# Patient Record
Sex: Female | Born: 1950 | Race: White | Hispanic: No | Marital: Married | Smoking: Former smoker
Health system: Southern US, Community
[De-identification: ages and names within clinical notes are randomized; demographics above are authoritative.]

## PROBLEM LIST (undated history)

## (undated) DIAGNOSIS — Z8489 Family history of other specified conditions: Secondary | ICD-10-CM

## (undated) DIAGNOSIS — N281 Cyst of kidney, acquired: Secondary | ICD-10-CM

## (undated) DIAGNOSIS — F419 Anxiety disorder, unspecified: Secondary | ICD-10-CM

## (undated) DIAGNOSIS — R112 Nausea with vomiting, unspecified: Secondary | ICD-10-CM

## (undated) DIAGNOSIS — I10 Essential (primary) hypertension: Secondary | ICD-10-CM

## (undated) DIAGNOSIS — G4700 Insomnia, unspecified: Secondary | ICD-10-CM

## (undated) DIAGNOSIS — Z9889 Other specified postprocedural states: Secondary | ICD-10-CM

## (undated) DIAGNOSIS — R519 Headache, unspecified: Secondary | ICD-10-CM

## (undated) DIAGNOSIS — M722 Plantar fascial fibromatosis: Secondary | ICD-10-CM

## (undated) DIAGNOSIS — M797 Fibromyalgia: Secondary | ICD-10-CM

## (undated) DIAGNOSIS — I671 Cerebral aneurysm, nonruptured: Secondary | ICD-10-CM

## (undated) DIAGNOSIS — G5600 Carpal tunnel syndrome, unspecified upper limb: Secondary | ICD-10-CM

## (undated) DIAGNOSIS — K219 Gastro-esophageal reflux disease without esophagitis: Secondary | ICD-10-CM

## (undated) DIAGNOSIS — E785 Hyperlipidemia, unspecified: Secondary | ICD-10-CM

## (undated) DIAGNOSIS — E039 Hypothyroidism, unspecified: Secondary | ICD-10-CM

## (undated) DIAGNOSIS — J309 Allergic rhinitis, unspecified: Secondary | ICD-10-CM

## (undated) DIAGNOSIS — K5792 Diverticulitis of intestine, part unspecified, without perforation or abscess without bleeding: Secondary | ICD-10-CM

## (undated) DIAGNOSIS — R51 Headache: Secondary | ICD-10-CM

## (undated) HISTORY — DX: Carpal tunnel syndrome, unspecified upper limb: G56.00

## (undated) HISTORY — DX: Cyst of kidney, acquired: N28.1

## (undated) HISTORY — PX: TONSILLECTOMY: SUR1361

## (undated) HISTORY — DX: Essential (primary) hypertension: I10

## (undated) HISTORY — PX: CARPAL TUNNEL RELEASE: SHX101

## (undated) HISTORY — DX: Hyperlipidemia, unspecified: E78.5

## (undated) HISTORY — DX: Gastro-esophageal reflux disease without esophagitis: K21.9

## (undated) HISTORY — DX: Insomnia, unspecified: G47.00

## (undated) HISTORY — DX: Cerebral aneurysm, nonruptured: I67.1

## (undated) HISTORY — DX: Anxiety disorder, unspecified: F41.9

## (undated) HISTORY — DX: Allergic rhinitis, unspecified: J30.9

## (undated) HISTORY — DX: Hypothyroidism, unspecified: E03.9

## (undated) HISTORY — DX: Fibromyalgia: M79.7

---

## 1992-09-26 HISTORY — PX: PARTIAL HYSTERECTOMY: SHX80

## 1992-09-26 HISTORY — PX: BREAST LUMPECTOMY: SHX2

## 1999-09-27 HISTORY — PX: APPENDECTOMY: SHX54

## 2000-08-17 ENCOUNTER — Encounter: Payer: Self-pay | Admitting: Emergency Medicine

## 2000-08-17 ENCOUNTER — Inpatient Hospital Stay (HOSPITAL_COMMUNITY): Admission: EM | Admit: 2000-08-17 | Discharge: 2000-08-19 | Payer: Self-pay | Admitting: *Deleted

## 2000-08-17 ENCOUNTER — Encounter (INDEPENDENT_AMBULATORY_CARE_PROVIDER_SITE_OTHER): Payer: Self-pay | Admitting: Specialist

## 2002-10-02 ENCOUNTER — Encounter: Payer: Self-pay | Admitting: Family Medicine

## 2002-10-02 ENCOUNTER — Ambulatory Visit (HOSPITAL_COMMUNITY): Admission: RE | Admit: 2002-10-02 | Discharge: 2002-10-02 | Payer: Self-pay | Admitting: Family Medicine

## 2004-03-02 ENCOUNTER — Encounter: Admission: RE | Admit: 2004-03-02 | Discharge: 2004-03-02 | Payer: Self-pay | Admitting: Neurology

## 2004-05-05 ENCOUNTER — Ambulatory Visit (HOSPITAL_COMMUNITY): Admission: RE | Admit: 2004-05-05 | Discharge: 2004-05-05 | Payer: Self-pay | Admitting: Family Medicine

## 2008-11-17 ENCOUNTER — Emergency Department (HOSPITAL_COMMUNITY): Admission: EM | Admit: 2008-11-17 | Discharge: 2008-11-17 | Payer: Self-pay | Admitting: Internal Medicine

## 2008-11-29 ENCOUNTER — Encounter: Admission: RE | Admit: 2008-11-29 | Discharge: 2008-11-29 | Payer: Self-pay | Admitting: Family Medicine

## 2008-12-01 ENCOUNTER — Encounter: Admission: RE | Admit: 2008-12-01 | Discharge: 2008-12-01 | Payer: Self-pay | Admitting: Family Medicine

## 2010-01-26 ENCOUNTER — Encounter: Admission: RE | Admit: 2010-01-26 | Discharge: 2010-01-26 | Payer: Self-pay | Admitting: Family Medicine

## 2010-10-16 ENCOUNTER — Encounter: Payer: Self-pay | Admitting: Family Medicine

## 2011-01-11 LAB — POCT I-STAT, CHEM 8
Calcium, Ion: 1.22 mmol/L (ref 1.12–1.32)
Chloride: 106 mEq/L (ref 96–112)
Creatinine, Ser: 0.8 mg/dL (ref 0.4–1.2)
Hemoglobin: 13.3 g/dL (ref 12.0–15.0)
Sodium: 141 mEq/L (ref 135–145)

## 2011-01-11 LAB — LIPASE, BLOOD: Lipase: 28 U/L (ref 11–59)

## 2011-01-11 LAB — POCT CARDIAC MARKERS: Myoglobin, poc: 58.7 ng/mL (ref 12–200)

## 2011-01-11 LAB — URINALYSIS, ROUTINE W REFLEX MICROSCOPIC
Hgb urine dipstick: NEGATIVE
Ketones, ur: NEGATIVE mg/dL
pH: 5.5 (ref 5.0–8.0)

## 2011-01-11 LAB — HEPATIC FUNCTION PANEL
ALT: 22 U/L (ref 0–35)
AST: 19 U/L (ref 0–37)
Albumin: 3.3 g/dL — ABNORMAL LOW (ref 3.5–5.2)
Bilirubin, Direct: <0.1 mg/dL (ref 0.0–0.3)

## 2011-01-11 LAB — DIFFERENTIAL
Basophils Relative: 1 % (ref 0–1)
Eosinophils Relative: 2 % (ref 0–5)
Monocytes Absolute: 0.4 10*3/uL (ref 0.1–1.0)
Neutro Abs: 4.5 10*3/uL (ref 1.7–7.7)
Neutrophils Relative %: 69 % (ref 43–77)

## 2011-01-11 LAB — CBC
MCHC: 35 g/dL (ref 30.0–36.0)
MCV: 90.2 fL (ref 78.0–100.0)

## 2011-02-11 NOTE — Op Note (Signed)
[place]  Patient:    Amanda Fry, Amanda Fry                   MRN: [id number] Proc. Date: 08/17/00 Adm. Date:  [id number] Attending:  Abigail Miyamoto A                           Operative Report  PREOPERATIVE DIAGNOSIS:  Perforated appendicitis.  POSTOPERATIVE DIAGNOSIS:  Perforated appendicitis.  PROCEDURE:  Open appendectomy.  SURGEON:  Abigail Miyamoto, M.D.  ANESTHESIA:  General endotracheal anesthesia.  ESTIMATED BLOOD LOSS:  Minimal.  DESCRIPTION OF PROCEDURE:  The patient was brought to the operating room, identified as [name].  She was placed supine on the operating room table, and general anesthesia was induced.  Her abdomen was then prepped and draped in the usual sterile fashion.  Using a #10 blade, a small transverse incision was made in the patients right lower quadrant.  The incision was carried down through Scarpas fascia with the electrocautery.  The external oblique fascia was then identified and opened with the scalp. and further with Metzenbaums.  The underlying muscle fibers were then split bluntly with a Kelly clamp.  The underlying peritoneum was then identified and opened with the scalpel.  Upon entering the abdominal cavity, a moderate amount of turbid fluid and purulence was identified.  This fluid was sent for cultures and sensitivities.  The appendix was then identified and grasped with a Babcock clamp and elevated out of the wound.  It was felt to be inflamed and had perforation at the tip.  Mesoappendix was then taken down with several clamps and 2-0 Vicryl ties.  Appendiceal base was then identified and crushed with a hemostat and then tied off in the imbricated fashion with several 2-0 Vicryl ties.  The appendix was completely transected and removed from the field. Hemostasis appeared to be achieved.  The abdomen was then copiously irrigated with normal saline.  The peritoneum was then closed with a  running 2-0 Vicryl suture.  Fascial layer was then closed with a running 2-0 Vicryl suture as well.  The wound was then again thoroughly irrigated.  Scarpas fascia was closed with running 2-0 Vicryl suture.  The fascial layer was then closed in a running 2-0 Vicryl suture as well.  The wound was then again thoroughly irrigated.  Scarpas fascia was closed with interrupted 0 Vicryl sutures and the skin was closed with skin staples.  The patient tolerated the procedure well.  All sponge, needle, and instrument counts were correct at the end of the procedure.  The patient was then extubated in the operating room and taken in stable condition to the recovery room. DD:  08/17/00 TD:  08/18/00 Job: [id number]

## 2012-01-25 ENCOUNTER — Other Ambulatory Visit (HOSPITAL_COMMUNITY): Payer: Self-pay | Admitting: Family Medicine

## 2012-01-25 DIAGNOSIS — R109 Unspecified abdominal pain: Secondary | ICD-10-CM

## 2012-01-26 ENCOUNTER — Ambulatory Visit (HOSPITAL_COMMUNITY)
Admission: RE | Admit: 2012-01-26 | Discharge: 2012-01-26 | Disposition: A | Discharge status: Home / Self Care | Payer: BC Managed Care – PPO | Source: Ambulatory Visit | Attending: Family Medicine | Admitting: Family Medicine

## 2012-01-26 ENCOUNTER — Ambulatory Visit
Admission: RE | Admit: 2012-01-26 | Discharge: 2012-01-26 | Disposition: A | Discharge status: Home / Self Care | Payer: BC Managed Care – PPO | Source: Ambulatory Visit | Attending: Family Medicine | Admitting: Family Medicine

## 2012-01-26 ENCOUNTER — Other Ambulatory Visit: Payer: Self-pay | Admitting: Family Medicine

## 2012-01-26 DIAGNOSIS — R109 Unspecified abdominal pain: Secondary | ICD-10-CM

## 2012-01-26 DIAGNOSIS — N281 Cyst of kidney, acquired: Secondary | ICD-10-CM | POA: Insufficient documentation

## 2012-01-26 DIAGNOSIS — R1011 Right upper quadrant pain: Secondary | ICD-10-CM

## 2012-01-26 MED ORDER — IOHEXOL 300 MG/ML  SOLN
125.0000 mL | Freq: Once | INTRAMUSCULAR | Status: AC | PRN
  Administered 2012-01-26: 125 mL via INTRAVENOUS

## 2012-01-31 ENCOUNTER — Other Ambulatory Visit: Payer: Self-pay | Admitting: Family Medicine

## 2012-01-31 ENCOUNTER — Ambulatory Visit
Admission: RE | Admit: 2012-01-31 | Discharge: 2012-01-31 | Disposition: A | Discharge status: Home / Self Care | Payer: BC Managed Care – PPO | Source: Ambulatory Visit | Attending: Family Medicine | Admitting: Family Medicine

## 2012-01-31 DIAGNOSIS — R109 Unspecified abdominal pain: Secondary | ICD-10-CM

## 2012-02-06 ENCOUNTER — Other Ambulatory Visit (HOSPITAL_COMMUNITY): Payer: Self-pay | Admitting: Gastroenterology

## 2012-02-06 DIAGNOSIS — R1011 Right upper quadrant pain: Secondary | ICD-10-CM

## 2012-02-09 ENCOUNTER — Encounter (HOSPITAL_COMMUNITY)
Admission: RE | Admit: 2012-02-09 | Discharge: 2012-02-09 | Disposition: A | Discharge status: Home / Self Care | Payer: BC Managed Care – PPO | Source: Ambulatory Visit | Attending: Gastroenterology | Admitting: Gastroenterology

## 2012-02-09 DIAGNOSIS — R1011 Right upper quadrant pain: Secondary | ICD-10-CM | POA: Insufficient documentation

## 2012-02-09 MED ORDER — TECHNETIUM TC 99M MEBROFENIN IV KIT
5.0000 | PACK | Freq: Once | INTRAVENOUS | Status: AC | PRN
  Administered 2012-02-09: 5 via INTRAVENOUS

## 2012-02-09 MED ORDER — SINCALIDE 5 MCG IJ SOLR
INTRAMUSCULAR | Status: AC
  Administered 2012-02-09: 1.82 ug via INTRAVENOUS
  Filled 2012-02-09: qty 5

## 2012-02-09 MED ORDER — SINCALIDE 5 MCG IJ SOLR
0.0200 ug/kg | Freq: Once | INTRAMUSCULAR | Status: AC
  Administered 2012-02-09: 1.82 ug via INTRAVENOUS

## 2012-05-27 ENCOUNTER — Ambulatory Visit (INDEPENDENT_AMBULATORY_CARE_PROVIDER_SITE_OTHER): Payer: BC Managed Care – PPO | Admitting: Emergency Medicine

## 2012-05-27 DIAGNOSIS — S61219A Laceration without foreign body of unspecified finger without damage to nail, initial encounter: Secondary | ICD-10-CM

## 2012-05-27 DIAGNOSIS — M79646 Pain in unspecified finger(s): Secondary | ICD-10-CM

## 2012-05-27 DIAGNOSIS — S61209A Unspecified open wound of unspecified finger without damage to nail, initial encounter: Secondary | ICD-10-CM

## 2012-05-27 DIAGNOSIS — M79609 Pain in unspecified limb: Secondary | ICD-10-CM

## 2012-05-27 NOTE — Patient Instructions (Addendum)
WOUND CARE Please return in 7-10 days to have your stitches/staples removed or sooner if you have concerns. Marland Kitchen Keep area clean and dry for 24 hours. Do not remove bandage, if applied. . After 24 hours, remove bandage and wash wound gently with mild soap and warm water. Reapply a new bandage after cleaning wound, if directed. . Continue daily cleansing with soap and water until stitches/staples are removed. . Do not apply any ointments or creams to the wound while stitches/staples are in place, as this may cause delayed healing. . Notify the office if you experience any of the following signs of infection: Swelling, redness, pus drainage, streaking, fever >101.0 F . Notify the office if you experience excessive bleeding that does not stop after 15-20 minutes of constant, firm pressure.    Laceration Care, Adult A laceration is a cut or lesion that goes through all layers of the skin and into the tissue just beneath the skin. TREATMENT  Some lacerations may not require closure. Some lacerations may not be able to be closed due to an increased risk of infection. It is important to see your caregiver as soon as possible after an injury to minimize the risk of infection and maximize the opportunity for successful closure. If closure is appropriate, pain medicines may be given, if needed. The wound will be cleaned to help prevent infection. Your caregiver will use stitches (sutures), staples, wound glue (adhesive), or skin adhesive strips to repair the laceration. These tools bring the skin edges together to allow for faster healing and a better cosmetic outcome. However, all wounds will heal with a scar. Once the wound has healed, scarring can be minimized by covering the wound with sunscreen during the day for 1 full year. HOME CARE INSTRUCTIONS  For sutures or staples:  Keep the wound clean and dry.   If you were given a bandage (dressing), you should change it at least once a day. Also,  change the dressing if it becomes wet or dirty, or as directed by your caregiver.   Wash the wound with soap and water 2 times a day. Rinse the wound off with water to remove all soap. Pat the wound dry with a clean towel.   After cleaning, apply a thin layer of the antibiotic ointment as recommended by your caregiver. This will help prevent infection and keep the dressing from sticking.   You may shower as usual after the first 24 hours. Do not soak the wound in water until the sutures are removed.   Only take over-the-counter or prescription medicines for pain, discomfort, or fever as directed by your caregiver.   Get your sutures or staples removed as directed by your caregiver.  For skin adhesive strips:  Keep the wound clean and dry.   Do not get the skin adhesive strips wet. You may bathe carefully, using caution to keep the wound dry.   If the wound gets wet, pat it dry with a clean towel.   Skin adhesive strips will fall off on their own. You may trim the strips as the wound heals. Do not remove skin adhesive strips that are still stuck to the wound. They will fall off in time.  For wound adhesive:  You may briefly wet your wound in the shower or bath. Do not soak or scrub the wound. Do not swim. Avoid periods of heavy perspiration until the skin adhesive has fallen off on its own. After showering or bathing, gently pat the wound dry with  a clean towel.   Do not apply liquid medicine, cream medicine, or ointment medicine to your wound while the skin adhesive is in place. This may loosen the film before your wound is healed.   If a dressing is placed over the wound, be careful not to apply tape directly over the skin adhesive. This may cause the adhesive to be pulled off before the wound is healed.   Avoid prolonged exposure to sunlight or tanning lamps while the skin adhesive is in place. Exposure to ultraviolet light in the first year will darken the scar.   The skin adhesive  will usually remain in place for 5 to 10 days, then naturally fall off the skin. Do not pick at the adhesive film.  You may need a tetanus shot if:  You cannot remember when you had your last tetanus shot.   You have never had a tetanus shot.  If you get a tetanus shot, your arm may swell, get red, and feel warm to the touch. This is common and not a problem. If you need a tetanus shot and you choose not to have one, there is a rare chance of getting tetanus. Sickness from tetanus can be serious. SEEK MEDICAL CARE IF:   You have redness, swelling, or increasing pain in the wound.   You see a red line that goes away from the wound.   You have yellowish-white fluid (pus) coming from the wound.   You have a fever.   You notice a bad smell coming from the wound or dressing.   Your wound breaks open before or after sutures have been removed.   You notice something coming out of the wound such as wood or glass.   Your wound is on your hand or foot and you cannot move a finger or toe.  SEEK IMMEDIATE MEDICAL CARE IF:   Your pain is not controlled with prescribed medicine.   You have severe swelling around the wound causing pain and numbness or a change in color in your arm, hand, leg, or foot.   Your wound splits open and starts bleeding.   You have worsening numbness, weakness, or loss of function of any joint around or beyond the wound.   You develop painful lumps near the wound or on the skin anywhere on your body.  MAKE SURE YOU:   Understand these instructions.   Will watch your condition.   Will get help right away if you are not doing well or get worse.  Document Released: 09/12/2005 Document Revised: 09/01/2011 Document Reviewed: 03/08/2011 [facility] Patient Information 2012 [place], [place].

## 2012-05-27 NOTE — Progress Notes (Signed)
Patient ID: MICHON KACZMAREK MRN: [id number], DOB: [birth year], 61 y.o. Date of Encounter: 05/27/2012, 6:14 PM   PROCEDURE NOTE: Verbal consent obtained. Sterile technique employed. Numbing: Anesthesia obtained with 2% lidocaine plain.  Cleansed with soap and water. Irrigated.  Wound explored, no deep structures involved, no foreign bodies.   Wound repaired with # 4 SI sutures. Hemostasis obtained. Wound cleansed and dressed.  Wound care instructions including precautions covered with patient. Handout given.  Anticipate suture removal in 7-10 days  Rhoderick Moody, PA-C 05/27/2012 6:14 PM

## 2012-05-27 NOTE — Progress Notes (Signed)
   Date:  05/27/2012   Name:  RAYNEE MCCASLAND   DOB:  01-24-1951   MRN:  [id number] Gender: female Age: 61 y.o.  PCP:  Allean Found, MD    Chief Complaint: No chief complaint on file.   History of Present Illness:  Amanda Fry is a 61 y.o. pleasant patient who presents with the following:  Cutting up a cantaloup and slipped and cut her left index finger at the tip. Not current on tetanus and she will check with her FMD Tuesday to verify that and obtain one if needed there.  Denies any other complaint.  There is no problem list on file for this patient.   No past medical history on file.  No past surgical history on file.  History  Substance Use Topics  . Smoking status: Not on file  . Smokeless tobacco: Not on file  . Alcohol Use: Not on file    No family history on file.  No Known Allergies  Medication list has been reviewed and updated.  No current outpatient prescriptions on file prior to visit.    Review of Systems:  As per HPI, otherwise negative.    Physical Examination: There were no vitals filed for this visit. There were no vitals filed for this visit. There is no height or weight on file to calculate BMI. Ideal Body Weight:     GEN: WDWN, NAD, Non-toxic, Alert & Oriented x 3 HEENT: Atraumatic, Normocephalic.  Ears and Nose: No external deformity. EXTR: No clubbing/cyanosis/edema NEURO: Normal gait.  PSYCH: Normally interactive. Conversant. Not depressed or anxious appearing.  Calm demeanor.  Left Hand:  Small flap laceration tip of fourth finger.  Flap has good circulation.  NATI  No FB  Assessment and Plan: Laceration finger tip  Follow up in one week for suture removal  Carmelina Dane, MD

## 2012-06-05 ENCOUNTER — Ambulatory Visit (INDEPENDENT_AMBULATORY_CARE_PROVIDER_SITE_OTHER): Payer: BC Managed Care – PPO | Admitting: Family Medicine

## 2012-06-05 VITALS — BP 130/76 | HR 68 | Temp 98.4°F | Resp 18

## 2012-06-05 DIAGNOSIS — S61209A Unspecified open wound of unspecified finger without damage to nail, initial encounter: Secondary | ICD-10-CM

## 2012-06-05 NOTE — Progress Notes (Signed)
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 She's had no significant pain, redness, discharge, or loss of range of motion  Objective: 5 sutures identified at tip of left index finger with well healed distal phalanx. Sutures removed without problem  Assessment: Well-healed wound following accident of

## 2012-06-07 ENCOUNTER — Encounter: Payer: Self-pay | Admitting: Family Medicine

## 2013-03-20 ENCOUNTER — Other Ambulatory Visit: Payer: Self-pay | Admitting: Family Medicine

## 2013-03-20 DIAGNOSIS — N281 Cyst of kidney, acquired: Secondary | ICD-10-CM

## 2013-03-22 ENCOUNTER — Ambulatory Visit
Admission: RE | Admit: 2013-03-22 | Discharge: 2013-03-22 | Disposition: A | Discharge status: Home / Self Care | Source: Ambulatory Visit | Attending: Family Medicine | Admitting: Family Medicine

## 2013-03-22 DIAGNOSIS — N281 Cyst of kidney, acquired: Secondary | ICD-10-CM

## 2014-03-20 ENCOUNTER — Other Ambulatory Visit: Payer: Self-pay | Admitting: Orthopedic Surgery

## 2014-03-20 DIAGNOSIS — M542 Cervicalgia: Secondary | ICD-10-CM

## 2014-03-30 ENCOUNTER — Ambulatory Visit
Admission: RE | Admit: 2014-03-30 | Discharge: 2014-03-30 | Disposition: A | Discharge status: Home / Self Care | Source: Ambulatory Visit | Attending: Orthopedic Surgery | Admitting: Orthopedic Surgery

## 2014-03-30 DIAGNOSIS — M542 Cervicalgia: Secondary | ICD-10-CM

## 2015-04-07 ENCOUNTER — Encounter: Payer: Self-pay | Admitting: Neurology

## 2015-04-10 ENCOUNTER — Encounter: Payer: Self-pay | Admitting: Neurology

## 2015-04-10 ENCOUNTER — Ambulatory Visit (INDEPENDENT_AMBULATORY_CARE_PROVIDER_SITE_OTHER): Admitting: Neurology

## 2015-04-10 VITALS — BP 118/82 | HR 80 | Ht 67.0 in | Wt 207.0 lb

## 2015-04-10 DIAGNOSIS — I671 Cerebral aneurysm, nonruptured: Secondary | ICD-10-CM

## 2015-04-10 DIAGNOSIS — G250 Essential tremor: Secondary | ICD-10-CM

## 2015-04-10 MED ORDER — DIAZEPAM 5 MG PO TABS: ORAL_TABLET | ORAL | Status: DC

## 2015-04-10 NOTE — Progress Notes (Signed)
Subjective:   Amanda Fry was seen in consultation in the movement disorder clinic at the request of Reginia Naas, MD.  The evaluation is for tremor.  The patient is a 65 y.o. right handed female with a history of tremor.  Tremor is in the right hand and has been in that hand for several years but recently she has noted it a few times in the L hand and that has worried her.  She notes it first when she writes.  Sometimes she has to hold a cup with 2 hands.  Other people will always ask her why she is shaking.  She never notices the tremor at rest.  There is no family hx of tremor.    Affected by caffeine:  No. (2 small cups coffee per day) Affected by alcohol:  No.  Affected by stress/anxiety:  Yes.   Affected by fatigue:  No. Spills soup if on spoon:  Yes.   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  ? 17mm ACA aneurysm.  Also ? Minimal bulge, cannot exclude aneurysm, of cavernous segment of left ICA.  Outside reports reviewed: lab reports, office notes, radiology reports and referral letter/letters.  Allergies  Allergen Reactions  . Biaxin [Clarithromycin]   . Shellfish Allergy     Outpatient Encounter Prescriptions as of 04/10/2015  Medication Sig  . aspirin 81 MG tablet Take 81 mg by mouth daily.  . Calcium Carbonate-Vitamin D (CALCIUM 500 + D PO) Take by mouth.  . cetirizine (ZYRTEC) 10 MG tablet Take 10 mg by mouth daily.  . Cholecalciferol (VITAMIN D) 2000 UNITS tablet Take 2,000 Units by mouth daily.  . diclofenac (VOLTAREN) 75 MG EC tablet Take 75 mg by mouth 2  (two) times daily.  Marland Kitchen esomeprazole (NEXIUM) 20 MG capsule Take 20 mg by mouth daily at 12 noon.  [name] Oil 300 MG CAPS Take by mouth.  . levothyroxine (SYNTHROID, LEVOTHROID) 137 MCG tablet Take 137 mcg by mouth daily before breakfast.  . Magnesium 500 MG CAPS Take by mouth.  . metoprolol succinate (TOPROL-XL) 50 MG 24 hr tablet Take 50 mg by mouth daily. Take with or immediately following a meal.  . rosuvastatin (CRESTOR) 10 MG tablet Take 10 mg by mouth daily.  Marland Kitchen zolpidem (AMBIEN CR) 6.25 MG CR tablet Take 6.25 mg by mouth at bedtime as needed for sleep.   No facility-administered encounter medications on file as of 04/10/2015.    Past Medical History  Diagnosis Date  . HLD (hyperlipidemia)   . GERD (gastroesophageal reflux disease)   . Anxiety   . Hypothyroidism   . Hypertension     Past Surgical History  Procedure Laterality Date  . Partial hysterectomy  1994  . Appendectomy  2001  . Breast lumpectomy Left 1994    History   Social History  . Marital Status: Married    Spouse Name: N/A  . Number of Children: N/A  . Years of Education: N/A   Occupational History  . Not on file.   Social History Main Topics  . Smoking status:  Former Smoker    Quit date: 09/26/2009  . Smokeless tobacco: Not on file  . Alcohol Use: 0.0 oz/week    0 Standard drinks or equivalent per week     Comment: one drink a month  . Drug Use: No  . Sexual Activity: Not on file   Other Topics Concern  . Not on file   Social History Narrative    Family Status  Relation Status Death Age  . Father Deceased 24    heart disease  . Mother Deceased     breast cancer  . Brother Alive     healthy  . Brother Alive     healthy  . Sister Alive     DM, breast cancer  . Son Alive     healthy  . Daughter Alive     healthy    Review of Systems A complete 10 system ROS was obtained and was negative apart from what is mentioned.   Objective:   VITALS:   Filed Vitals:   04/10/15 0955    BP: 118/82  Pulse: 80  Height: 5\' 7"  (1.702 m)  Weight: 207 lb (93.895 kg)   Gen:  Appears stated age and in NAD. HEENT:  Normocephalic, atraumatic. The mucous membranes are moist. The superficial temporal arteries are without ropiness or tenderness. Cardiovascular: Regular rate and rhythm. Lungs: Clear to auscultation bilaterally. Neck: There are no carotid bruits noted bilaterally.  NEUROLOGICAL:  Orientation:  The patient is alert and oriented x 3.  Recent and remote memory are intact.  Attention span and concentration are normal.  Able to name objects and repeat without trouble.  Fund of knowledge is appropriate Cranial nerves: There is good facial symmetry. The pupils are equal round and reactive to light bilaterally. Fundoscopic exam reveals clear disc margins bilaterally. Extraocular muscles are intact and visual fields are full to confrontational testing. Speech is fluent and clear. Soft palate rises symmetrically and there is no tongue deviation. Hearing is intact to conversational tone. Tone: Tone is good throughout. Sensation: Sensation is intact to light touch and pinprick throughout (facial, trunk, extremities). Vibration is intact at the bilateral big toe. There is no extinction with double simultaneous stimulation. There is no sensory dermatomal level identified. Coordination:  The patient has no dysdiadichokinesia or dysmetria. Motor: Strength is 5/5 in the bilateral upper and lower extremities.  There is some decreased grip strength on the right compared to that of the left, but there is also some give way weakness when testing the intrinsic musculature of the hand on the right that improves with encouragement.  Shoulder shrug is equal bilaterally.  There is no pronator drift.  There are no fasciculations noted. DTR's: Deep tendon reflexes are 2/4 at the bilateral biceps, triceps, brachioradialis, 1/4 at the bilateral patella and absent at the bilateral achilles.  Plantar  responses are downgoing bilaterally. Gait and Station: The patient is able to ambulate without difficulty. The patient is able to heel toe walk without any difficulty. The patient is not able to ambulate in a tandem fashion without difficulty. The patient is able to stand in the Romberg position.   MOVEMENT EXAM: Tremor:  There is no significant tremor in the UE, noted most significantly with action.  Tremor is evident when she draws Archimedes spirals bilaterally.  There is no tremor at rest.  When asked to pour water from one glass to another, she spills the water when the full glass of water is in the right hand.  Assessment/Plan:   1.  Essential Tremor.  -Long talk with the patient regarding pathophysiology of essential tremor.  I see no evidence of a neurodegenerative process such as Parkinson's disease.  Talked with her about various treatments.  She really wants no medical treatment for now.  Talked to her about various medical devices, such as weighted utensils, which can help.  Happy to write a prescription if she would like, but she wants to hold on that for right now.  I told her that a fatter, heavier pen would help her write better than a thinner, light weight one.  Patient education was provided.  Literature was given to the patient.  We talked about liftware.   2.  Abnormal brain scan the past  -There is a question of cerebral aneurysm on her 2010 MRA of the brain.  I really do think that this needs reimaged and she was agreeable.  She is claustrophobic and Valium will be provided for this. 3.  I will plan on seeing the patient back in 6-8 months, sooner should new neurologic issues arise.  Greater than 50% of the 60 minute visit spent in counseling as above.

## 2015-04-10 NOTE — Patient Instructions (Addendum)
1. We have scheduled you at [facility] for your MRA on 04/20/2015 at 5:00 pm. Please arrive 15 minutes prior and go to 1st floor radiology. If you need to reschedule for any reason please call [phone].

## 2015-04-20 ENCOUNTER — Ambulatory Visit (HOSPITAL_COMMUNITY)
Admission: RE | Admit: 2015-04-20 | Discharge: 2015-04-20 | Disposition: A | Discharge status: Home / Self Care | Source: Ambulatory Visit | Attending: Neurology | Admitting: Neurology

## 2015-04-20 ENCOUNTER — Other Ambulatory Visit: Payer: Self-pay | Admitting: Neurology

## 2015-04-20 DIAGNOSIS — I671 Cerebral aneurysm, nonruptured: Secondary | ICD-10-CM

## 2015-04-20 DIAGNOSIS — R251 Tremor, unspecified: Secondary | ICD-10-CM | POA: Diagnosis present

## 2015-04-20 LAB — POCT I-STAT CREATININE: Creatinine, Ser: 0.9 mg/dL (ref 0.44–1.00)

## 2015-04-21 ENCOUNTER — Telehealth: Payer: Self-pay | Admitting: Neurology

## 2015-04-21 NOTE — Telephone Encounter (Signed)
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  It is unchanged in size from 2010.  Happy to send her for an opinion with interventionalist if she would like (they probably wouldn't do anything with it but would recommend surveillance of it)

## 2015-04-21 NOTE — Telephone Encounter (Signed)
Patient made aware of results. She would like to know how often she should have this scanned if she just wants to watch it. Please advise.

## 2015-04-21 NOTE — Telephone Encounter (Signed)
Probably best to get the opinion from the interventionalist on this, but it hasn't changed in the last 6 years so it hopefully won't.

## 2015-04-21 NOTE — Telephone Encounter (Signed)
Patient made aware we need to refer her to an interventionalist. Referral faxed to Dr Kathyrn Sheriff at [facility] at [phone] with confirmation received. They will call patient with appt. Patient given the number to call if she doesn't hear from them.

## 2015-12-09 ENCOUNTER — Ambulatory Visit (INDEPENDENT_AMBULATORY_CARE_PROVIDER_SITE_OTHER): Admitting: Neurology

## 2015-12-09 ENCOUNTER — Telehealth: Payer: Self-pay | Admitting: Neurology

## 2015-12-09 ENCOUNTER — Encounter: Payer: Self-pay | Admitting: Neurology

## 2015-12-09 VITALS — BP 124/86 | HR 84 | Ht 67.5 in | Wt 210.0 lb

## 2015-12-09 DIAGNOSIS — G250 Essential tremor: Secondary | ICD-10-CM

## 2015-12-09 DIAGNOSIS — I671 Cerebral aneurysm, nonruptured: Secondary | ICD-10-CM

## 2015-12-09 MED ORDER — PROPRANOLOL HCL ER 80 MG PO CP24: 80.0000 mg | ORAL_CAPSULE | Freq: Every day | ORAL | Status: DC

## 2015-12-09 NOTE — Telephone Encounter (Signed)
Dr. Thompson Caul nurse called back- Dr Tamala Julian gave the okay to switch medication. Inderal 80 mg sent to [facility]. Patient made aware. 3 month follow up scheduled. Patient will call with any problems with medication prior to appt.

## 2015-12-09 NOTE — Telephone Encounter (Signed)
Spoke with Ailene Ravel at Dr Hal Hope Smith's office and left message for a call back to see if she would be okay with switching patient from Metoprolol 50 mg to Inderal 80 mg for tremor control and blood pressure management. Awaiting call back.

## 2015-12-09 NOTE — Progress Notes (Signed)
Subjective:   Amanda Fry was seen in consultation in the movement disorder clinic at the request of Reginia Naas, MD.  The evaluation is for tremor.  The patient is a 65 y.o. right handed female with a history of tremor.  Tremor is in the right hand and has been in that hand for several years but recently she has noted it a few times in the L hand and that has worried her.  She notes it first when she writes.  Sometimes she has to hold a cup with 2 hands.  Other people will always ask her why she is shaking.  She never notices the tremor at rest.  There is no family hx of tremor.    Affected by caffeine:  No. (2 small cups coffee per day) Affected by alcohol:  No.  Affected by stress/anxiety:  Yes.   Affected by fatigue:  No. Spills soup if on spoon:  Yes.   Spills glass of liquid if full: may or may not but generally will carry with both hands if having a bad day Affects ADL's (tying shoes, brushing teeth, etc):  No., except will note tremor with applying mascara  12/09/15 update:  The patient is following up today.  I have reviewed prior records made available to me.  She has a history of essential tremor.  She is currently on no medication for that.  She is noting that her writing is getting worse.  Husband makes pens and having trouble no matter what pen she uses its troublesome.  Eats peas with spoons.    She had an MRI brain scan in 2010 with possible evidence of aneurysm, so we repeated an MRA last visit.  This was done on 04/20/2015.  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  It had not changed since 2010.  The patient had some questions regarding this, so I referred her to Dr. Kathyrn Sheriff.  I called yesterday to get notes, and apparently the patient spoke over the phone with Dr. Cyndy Freeze and he told her not to come in and that they would follow up with her in 2 years and would do a CTA at that time.  Current/Previously tried tremor  medications: used to be on toprol xl for headache but no longer on it     Outside reports reviewed: lab reports, office notes, radiology reports and referral letter/letters.  Allergies  Allergen Reactions  . Biaxin [Clarithromycin]   . Shellfish Allergy     Outpatient Encounter Prescriptions as of 12/09/2015  Medication Sig  . aspirin 81 MG tablet Take 81 mg by mouth daily.  . Calcium Carbonate-Vitamin D (CALCIUM 500 + D PO) Take by mouth.  . cetirizine (ZYRTEC) 10 MG tablet Take 10 mg by mouth daily.  . Cholecalciferol (VITAMIN D) 2000 UNITS tablet Take 2,000 Units by mouth daily.  . diclofenac (VOLTAREN) 75 MG EC tablet Take 75 mg by mouth 2 (two) times daily.  Marland Kitchen esomeprazole (NEXIUM) 20 MG capsule Take 20 mg by mouth daily at 12 noon.  [name] Oil 300 MG CAPS Take by mouth.  . levothyroxine (SYNTHROID, LEVOTHROID) 137 MCG tablet Take 137 mcg by mouth daily before breakfast.  . Magnesium 500 MG CAPS Take by mouth.  . metoprolol succinate (TOPROL-XL) 50 MG 24 hr tablet Take 50 mg by mouth daily. Take with or immediately following a meal.  . rosuvastatin (CRESTOR) 10 MG tablet Take 10 mg by mouth daily.  Marland Kitchen  zolpidem (AMBIEN CR) 6.25 MG CR tablet Take 6.25 mg by mouth at bedtime as needed for sleep.  . [DISCONTINUED] diazepam (VALIUM) 5 MG tablet Take one tablet on arrival to facility and one tablet just prior to MR if needed   No facility-administered encounter medications on file as of 12/09/2015.    Past Medical History  Diagnosis Date  . HLD (hyperlipidemia)   . GERD (gastroesophageal reflux disease)   . Anxiety   . Hypothyroidism   . Hypertension     Past Surgical History  Procedure Laterality Date  . Partial hysterectomy  1994  . Appendectomy  2001  . Breast lumpectomy Left 1994    Social History   Social History  . Marital Status: Married    Spouse Name: N/A  . Number of Children: N/A  . Years of Education: N/A   Occupational History  . Not on file.    Social History Main Topics  . Smoking status: Former Smoker    Quit date: 09/26/2009  . Smokeless tobacco: Not on file  . Alcohol Use: 0.0 oz/week    0 Standard drinks or equivalent per week     Comment: one drink a month  . Drug Use: No  . Sexual Activity: Not on file   Other Topics Concern  . Not on file   Social History Narrative    Family Status  Relation Status Death Age  . Father Deceased 81    heart disease  . Mother Deceased     breast cancer  . Brother Alive     healthy  . Brother Alive     healthy  . Sister Alive     DM, breast cancer  . Son Alive     healthy  . Daughter Alive     healthy    Review of Systems A complete 10 system ROS was obtained and was negative apart from what is mentioned.   Objective:   VITALS:   Filed Vitals:   12/09/15 1012  BP: 124/86  Pulse: 84  Height: 5' 7.5" (1.715 m)  Weight: 210 lb (95.255 kg)   Gen:  Appears stated age and in NAD. HEENT:  Normocephalic, atraumatic. The mucous membranes are moist. The superficial temporal arteries are without ropiness or tenderness. Cardiovascular: Regular rate and rhythm. Lungs: Clear to auscultation bilaterally. Neck: There are no carotid bruits noted bilaterally.  NEUROLOGICAL:  Orientation:  The patient is alert and oriented x 3.   Cranial nerves: There is good facial symmetry. Marland Kitchen Speech is fluent and clear. Soft palate rises symmetrically and there is no tongue deviation. Hearing is intact to conversational tone. Tone: Tone is good throughout. Sensation: Sensation is intact to light touch throughout. Coordination:  The patient has no dysdiadichokinesia or dysmetria. Motor: Strength is 5/5 in the bilateral upper and lower extremities.  There is some decreased grip strength on the right compared to that of the left, but there is also some give way weakness when testing the intrinsic musculature of the hand on the right that improves with encouragement.  Shoulder shrug is equal  bilaterally.  There is no pronator drift.  There are no fasciculations noted. Gait and Station: The patient is able to ambulate without difficulty.   MOVEMENT EXAM: Tremor:  There is no significant tremor in the UE, noted most significantly with action.  Tremor is minimally evident when she draws Archimedes spirals bilaterally.  There is no tremor at rest.  When asked to pour water from one glass  to another, she spills the water when the full glass of water is in the right hand.  She filled out a form today, and there is some evidence of tremor in the handwriting.     Assessment/Plan:   1.  Essential Tremor.  -Long talk with the patient regarding pathophysiology of essential tremor.  I see no evidence of a neurodegenerative process such as Parkinson's disease.  Talked with her about various treatments.  She And I talked about beta blockers.  She is already on metoprolol for her blood pressure.  It may be of benefit to change the metoprolol to Inderal LA and use a higher dosage.  Her blood pressure and pulse look like they could tolerate it.  I will contact her primary care physician and ask if this would be a reasonable approach.  If we decide to switch, I will see her back in about 3 months.   2.  ACA aneurysm  -MRA last done on 04/20/2015.  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  It had not changed since 2010.  Dr. Christella Noa advised pt f/u in 2 years.  The patient was a little bit nervous about this.  We talked extensively about this and reviewed the fact that it really had not changed in at least 6 years. 3.  As above, I plan to see her in about 3 months if we decide to change medication.  Much greater than 50% of the 25 minute visit was spent in counseling.

## 2015-12-10 NOTE — Telephone Encounter (Signed)
Yes

## 2015-12-10 NOTE — Telephone Encounter (Signed)
You sent the [place] form right?

## 2016-02-02 ENCOUNTER — Other Ambulatory Visit: Payer: Self-pay | Admitting: Neurology

## 2016-02-02 MED ORDER — PROPRANOLOL HCL ER 80 MG PO CP24: 80.0000 mg | ORAL_CAPSULE | Freq: Every day | ORAL | Status: DC

## 2016-02-02 NOTE — Telephone Encounter (Signed)
Propranolol changed to 90 day supply.

## 2016-03-16 ENCOUNTER — Encounter: Payer: Self-pay | Admitting: Neurology

## 2016-03-16 ENCOUNTER — Ambulatory Visit (INDEPENDENT_AMBULATORY_CARE_PROVIDER_SITE_OTHER): Admitting: Neurology

## 2016-03-16 VITALS — BP 110/70 | HR 76 | Ht 67.5 in | Wt 207.0 lb

## 2016-03-16 DIAGNOSIS — G43019 Migraine without aura, intractable, without status migrainosus: Secondary | ICD-10-CM | POA: Diagnosis not present

## 2016-03-16 DIAGNOSIS — G250 Essential tremor: Secondary | ICD-10-CM

## 2016-03-16 MED ORDER — SUMATRIPTAN SUCCINATE 100 MG PO TABS: 100.0000 mg | ORAL_TABLET | Freq: Once | ORAL | Status: DC

## 2016-03-16 MED ORDER — METHYLPREDNISOLONE 4 MG PO TBPK: ORAL_TABLET | ORAL | Status: DC

## 2016-03-16 NOTE — Progress Notes (Signed)
Subjective:   Amanda DrainDeborah S Fry was seen in consultation in the movement disorder clinic at the request of Allean FoundSMITH,CANDACE THIELE, MD.  The evaluation is for tremor.  The patient is a 65 y.o. right handed female with a history of tremor.  Tremor is in the right hand and has been in that hand for several years but recently she has noted it a few times in the L hand and that has worried her.  She notes it first when she writes.  Sometimes she has to hold a cup with 2 hands.  Other people will always ask her why she is shaking.  She never notices the tremor at rest.  There is no family hx of tremor.    Affected by caffeine:  No. (2 small cups coffee per day) Affected by alcohol:  No.  Affected by stress/anxiety:  Yes.   Affected by fatigue:  No. Spills soup if on spoon:  Yes.   Spills glass of liquid if full: may or may not but generally will carry with both hands if having a bad day Affects ADL's (tying shoes, brushing teeth, etc):  No., except will note tremor with applying mascara  12/09/15 update:  The patient is following up today.  I have reviewed prior records made available to me.  She has a history of essential tremor.  She is currently on no medication for that.  She is noting that her writing is getting worse.  Husband makes pens and having trouble no matter what pen she uses its troublesome.  Eats peas with spoons.    She had an MRI brain scan in 2010 with possible evidence of aneurysm, so we repeated an MRA last visit.  This was done on 04/20/2015.  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  It had not changed since 2010.  The patient had some questions regarding this, so I referred her to Dr. Conchita ParisNundkumar.  I called yesterday to get notes, and apparently the patient spoke over the phone with Dr. Mikal Planeabell and he told her not to come in and that they would follow up with her in 2 years and would do a CTA at that time.  03/16/16 update:  The patient is following  up today.  Last visit, I asked her primary care physician affairs I changed tremor metoprolol to Inderal LA, 80 mg daily.  Her primary care physician approved this and we made the change to see if that would help tremor.  The patient states that she is doing much better and BP is better as well.  Care everywhere was queried, but no additional information was noted to add to medical hx.  Noted that she is having more headaches.  Has hx of migraine and was on topamax in past.  Initially thought that weather related, but has now had for 6 months.  Feels a "swollen area" over the L nose.  Has pain (soreness) over the L temple and may shoot down face and posteriorally.  Has a few "old topamax" and will take that and it helps (least helps her get to sleep).  Coming 2-3 days a week and then last 4 hours.  Feels that motrin causes it.  Imitrex helped in the past  Current/Previously tried tremor medications: used to be on toprol xl for headache but no longer on it     Outside reports reviewed: lab reports, office notes, radiology reports and referral letter/letters.  Allergies  Allergen Reactions  .  Biaxin [Clarithromycin]   . Shellfish Allergy     Outpatient Encounter Prescriptions as of 03/16/2016  Medication Sig  . aspirin 81 MG tablet Take 81 mg by mouth daily.  . Calcium Carbonate-Vitamin D (CALCIUM 500 + D PO) Take by mouth.  . cetirizine (ZYRTEC) 10 MG tablet Take 10 mg by mouth daily.  . Cholecalciferol (VITAMIN D) 2000 UNITS tablet Take 2,000 Units by mouth daily.  . diclofenac (VOLTAREN) 75 MG EC tablet Take 75 mg by mouth daily.   Marland Kitchen esomeprazole (NEXIUM) 20 MG capsule Take 20 mg by mouth daily at 12 noon.  [name] Oil 300 MG CAPS Take by mouth.  . levothyroxine (SYNTHROID, LEVOTHROID) 137 MCG tablet Take 137 mcg by mouth daily before breakfast.  . Magnesium 500 MG CAPS Take by mouth.  . propranolol ER (INDERAL LA) 80 MG 24 hr capsule Take 1 capsule (80 mg total) by mouth daily.  .  rosuvastatin (CRESTOR) 10 MG tablet Take 10 mg by mouth daily.  Marland Kitchen zolpidem (AMBIEN CR) 6.25 MG CR tablet Take 6.25 mg by mouth at bedtime as needed for sleep.   No facility-administered encounter medications on file as of 03/16/2016.    Past Medical History  Diagnosis Date  . HLD (hyperlipidemia)   . GERD (gastroesophageal reflux disease)   . Anxiety   . Hypothyroidism   . Hypertension     Past Surgical History  Procedure Laterality Date  . Partial hysterectomy  1994  . Appendectomy  2001  . Breast lumpectomy Left 1994    Social History   Social History  . Marital Status: Married    Spouse Name: N/A  . Number of Children: N/A  . Years of Education: N/A   Occupational History  . Not on file.   Social History Main Topics  . Smoking status: Former Smoker    Quit date: 09/26/2009  . Smokeless tobacco: Not on file  . Alcohol Use: 0.0 oz/week    0 Standard drinks or equivalent per week     Comment: one drink a month  . Drug Use: No  . Sexual Activity: Not on file   Other Topics Concern  . Not on file   Social History Narrative    Family Status  Relation Status Death Age  . Father Deceased 65    heart disease  . Mother Deceased     breast cancer  . Brother Alive     healthy  . Brother Alive     healthy  . Sister Alive     DM, breast cancer  . Son Alive     healthy  . Daughter Alive     healthy    Review of Systems A complete 10 system ROS was obtained and was negative apart from what is mentioned.   Objective:   VITALS:   Filed Vitals:   03/16/16 1008  BP: 110/70  Pulse: 76  Height: 5' 7.5" (1.715 m)  Weight: 207 lb (93.895 kg)   Gen:  Appears stated age and in NAD. HEENT:  Normocephalic, atraumatic. The mucous membranes are moist. The superficial temporal arteries are without ropiness or tenderness. Cardiovascular: Regular rate and rhythm. Lungs: Clear to auscultation bilaterally. Neck: There are no carotid bruits noted  bilaterally.  NEUROLOGICAL:  Orientation:  The patient is alert and oriented x 3.   Cranial nerves: There is good facial symmetry. Marland Kitchen Speech is fluent and clear. Soft palate rises symmetrically and there is no tongue deviation. Hearing is intact to  conversational tone. Tone: Tone is good throughout. Sensation: Sensation is intact to light touch throughout. Coordination:  The patient has no dysdiadichokinesia or dysmetria. Motor: Strength is 5/5 in the bilateral upper and lower extremities.   Gait and Station: The patient is able to ambulate without difficulty.   MOVEMENT EXAM: Tremor:  There is no significant tremor in the UE.  Able to pour water from one glass to another without spilling it (much improved) and with no evidence of tremor.   Assessment/Plan:   1.  Essential Tremor.  -doing great on inderal LA, 80 mg daily. 2.  ACA aneurysm  -MRA last done on 04/20/2015.  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  It had not changed since 2010.  Dr. Christella Noa advised pt f/u in 2 years.  The patient was a little bit nervous about this.  We talked extensively about this and reviewed the fact that it really had not changed in at least 6 years. 3.  Migraine  -will give medrol to see if we can get rid of headache cycle.    -RX given for imitrex.  Used in the past with success.  Risks, benefits, side effects and alternative therapies were discussed.  The opportunity to ask questions was given and they were answered to the best of my ability.  The patient expressed understanding and willingness to follow the outlined treatment protocols. 3.  As above, I plan to see her in about 3 months if headache better.  If not, we could consider topamax again or increase inderal LA.  Much greater than 50% of this visit was spent in counseling and coordinating care.  Total face to face time:  25 min

## 2016-03-16 NOTE — Patient Instructions (Signed)
1. Start Medrol dose pack 2. Take Imitrex 100 mg - 1 at onset of headache. Can repeat in 2 hours if needed. Max 2 per day. Max 2-3 days per week.

## 2016-06-14 NOTE — Progress Notes (Signed)
Subjective:   Amanda Fry was seen in consultation in the movement disorder clinic at the request of Amanda Naas, MD.  The evaluation is for tremor.  The patient is a 65 y.o. right handed female with a history of tremor.  Tremor is in the right hand and has been in that hand for several years but recently she has noted it a few times in the L hand and that has worried her.  She notes it first when she writes.  Sometimes she has to hold a cup with 2 hands.  Other people will always ask her why she is shaking.  She never notices the tremor at rest.  There is no family hx of tremor.    Affected by caffeine:  No. (2 small cups coffee per day) Affected by alcohol:  No.  Affected by stress/anxiety:  Yes.   Affected by fatigue:  No. Spills soup if on spoon:  Yes.   Spills glass of liquid if full: may or may not but generally will carry with both hands if having a bad day Affects ADL's (tying shoes, brushing teeth, etc):  No., except will note tremor with applying mascara  12/09/15 update:  The patient is following up today.  I have reviewed prior records made available to me.  She has a history of essential tremor.  She is currently on no medication for that.  She is noting that her writing is getting worse.  Husband makes pens and having trouble no matter what pen she uses its troublesome.  Eats peas with spoons.    She had an MRI brain scan in 2010 with possible evidence of aneurysm, so we repeated an MRA last visit.  This was done on 04/20/2015.  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  It had not changed since 2010.  The patient had some questions regarding this, so I referred her to Dr. Kathyrn Fry.  I called yesterday to get notes, and apparently the patient spoke over the phone with Dr. Cyndy Fry and he told her not to come in and that they would follow up with her in 2 years and would do a CTA at that time.  03/16/16 update:  The patient is following  up today.  Last visit, I asked her primary care physician affairs I changed tremor metoprolol to Inderal LA, 80 mg daily.  Her primary care physician approved this and we made the change to see if that would help tremor.  The patient states that she is doing much better and BP is better as well.  Care everywhere was queried, but no additional information was noted to add to medical hx.  Noted that she is having more headaches.  Has hx of migraine and was on topamax in past.  Initially thought that weather related, but has now had for 6 months.  Feels a "swollen area" over the L nose.  Has pain (soreness) over the L temple and may shoot down face and posteriorally.  Has a few "old topamax" and will take that and it helps (least helps her get to sleep).  Coming 2-3 days a week and then last 4 hours.  Feels that motrin causes it.  Imitrex helped in the past  06/16/16 update:  The patient follows up today.  She is on Inderal LA, 80 mg daily.  She is doing well in regards to tremor.  Only one AM where she had trouble holding pen.   She  denies lightheadedness or near syncope.  She was having headache last visit and I gave her a Medrol Dosepak and a prescription for Imitrex.  She states that she has done better but she isn't sure that the medrol helped it go away.  Hasn't had to take the imitrex.  States that the medrol made her fibromyalgia better.  No diplopia but has some floaters  Current/Previously tried tremor medications: used to be on toprol xl for headache but no longer on it     Outside reports reviewed: lab reports, office notes, radiology reports and referral letter/letters.  Allergies  Allergen Reactions  . Biaxin [Clarithromycin]   . Shellfish Allergy     Outpatient Encounter Prescriptions as of 06/16/2016  Medication Sig  . aspirin 81 MG tablet Take 81 mg by mouth daily.  . Calcium Carbonate-Vitamin D (CALCIUM 500 + D PO) Take by mouth.  . cetirizine (ZYRTEC) 10 MG tablet Take 10 mg by  mouth daily.  . Cholecalciferol (VITAMIN D) 2000 UNITS tablet Take 2,000 Units by mouth daily.  . diclofenac (VOLTAREN) 75 MG EC tablet Take 75 mg by mouth daily.   Marland Kitchen esomeprazole (NEXIUM) 20 MG capsule Take 20 mg by mouth daily at 12 noon.  [name] Oil 300 MG CAPS Take by mouth.  . levothyroxine (SYNTHROID, LEVOTHROID) 137 MCG tablet Take 137 mcg by mouth daily before breakfast.  . Magnesium 500 MG CAPS Take by mouth.  . propranolol ER (INDERAL LA) 80 MG 24 hr capsule Take 1 capsule (80 mg total) by mouth daily.  . rosuvastatin (CRESTOR) 10 MG tablet Take 10 mg by mouth daily.  . SUMAtriptan (IMITREX) 100 MG tablet Take 1 tablet (100 mg total) by mouth once. May repeat in 2 hours if headache persists or recurs. Not to exceed 2 per day.  . zolpidem (AMBIEN CR) 6.25 MG CR tablet Take 6.25 mg by mouth at bedtime as needed for sleep.  . [DISCONTINUED] methylPREDNISolone (MEDROL DOSEPAK) 4 MG TBPK tablet Use as directed   No facility-administered encounter medications on file as of 06/16/2016.     Past Medical History:  Diagnosis Date  . Anxiety   . GERD (gastroesophageal reflux disease)   . HLD (hyperlipidemia)   . Hypertension   . Hypothyroidism     Past Surgical History:  Procedure Laterality Date  . APPENDECTOMY  2001  . BREAST LUMPECTOMY Left 1994  . PARTIAL HYSTERECTOMY  1994    Social History   Social History  . Marital status: Married    Spouse name: N/A  . Number of children: N/A  . Years of education: N/A   Occupational History  . Not on file.   Social History Main Topics  . Smoking status: Former Smoker    Quit date: 09/26/2009  . Smokeless tobacco: Not on file  . Alcohol use 0.0 oz/week     Comment: one drink a month  . Drug use: No  . Sexual activity: Not on file   Other Topics Concern  . Not on file   Social History Narrative  . No narrative on file    Family Status  Relation Status  . Father Deceased at age 17   heart disease  . Mother Deceased     breast cancer  . Brother Alive   healthy  . Brother Alive   healthy  . Sister Alive   DM, breast cancer  . Son Alive   healthy  . Daughter Alive   healthy    Review  of Systems A complete 10 system ROS was obtained and was negative apart from what is mentioned.   Objective:   VITALS:   Vitals:   06/16/16 0928  BP: 122/80  Pulse: 73  Weight: 209 lb (94.8 kg)  Height: 5' 7.5" (1.715 m)   Gen:  Appears stated age and in NAD. HEENT:  Normocephalic, atraumatic. The mucous membranes are moist. The superficial temporal arteries are without ropiness or tenderness. Cardiovascular: Regular rate and rhythm. Lungs: Clear to auscultation bilaterally. Neck: There are no carotid bruits noted bilaterally.  NEUROLOGICAL:  Orientation:  The patient is alert and oriented x 3.   Cranial nerves: There is good facial symmetry.  There is L ptosis. When asked to sustain prolonged upgaze the L eye slowly tracks inferiorally but no diplopia noted.  The pupils are 3 mm and both equally reactive.  Fundi revealed clear disc margins bilaterally.  Speech is fluent and clear. Soft palate rises symmetrically and there is no tongue deviation. Hearing is intact to conversational tone. Tone: Tone is good throughout. Sensation: Sensation is intact to light touch throughout. Coordination:  The patient has no dysdiadichokinesia or dysmetria. Motor: Strength is 5/5 in the bilateral upper and lower extremities.   Gait and Station: The patient is able to ambulate without difficulty.   MOVEMENT EXAM: Tremor:  There is no significant tremor in the UE, either posturally or with intention.   Assessment/Plan:   1.  Essential Tremor.  -doing great on inderal LA, 80 mg daily. 2.  ACA aneurysm  -MRA last done on 04/20/2015.  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  It had not changed since 2010.  Dr. Christella Noa advised pt f/u in 2 years.   3.  Migraine  -improved and hasn't had  to use imitrex yet. 4.  L ptosis  -I don't remember seeing this in the past and after I mentioned it the patient stated that she has just started to notice this in pictures.  She is not sure if it is fluctuating.  Does state that the L side of her face just doesn't feel like the right.   Had some trouble maintaining prolonged upgaze.  -check AchR AB  -do MRI brain (pupils are equal)  -if above negative, will do MuSK  -asked pt to pay attention to whether it fluctuates during the day 5.  As above, I plan to see her in about 3 months, sooner should the above tests warrant or if new neuro issues arise.  Much greater than 50% of this visit was spent in counseling and coordinating care.  Total face to face time:  35 min

## 2016-06-16 ENCOUNTER — Ambulatory Visit (INDEPENDENT_AMBULATORY_CARE_PROVIDER_SITE_OTHER): Admitting: Neurology

## 2016-06-16 ENCOUNTER — Other Ambulatory Visit

## 2016-06-16 ENCOUNTER — Encounter: Payer: Self-pay | Admitting: Neurology

## 2016-06-16 VITALS — BP 122/80 | HR 73 | Ht 67.5 in | Wt 209.0 lb

## 2016-06-16 DIAGNOSIS — H02402 Unspecified ptosis of left eyelid: Secondary | ICD-10-CM | POA: Diagnosis not present

## 2016-06-16 DIAGNOSIS — G43009 Migraine without aura, not intractable, without status migrainosus: Secondary | ICD-10-CM | POA: Diagnosis not present

## 2016-06-16 DIAGNOSIS — I729 Aneurysm of unspecified site: Secondary | ICD-10-CM | POA: Diagnosis not present

## 2016-06-16 DIAGNOSIS — G250 Essential tremor: Secondary | ICD-10-CM

## 2016-06-16 MED ORDER — DIAZEPAM 5 MG PO TABS: ORAL_TABLET | ORAL | 0 refills | Status: DC

## 2016-06-16 NOTE — Patient Instructions (Signed)
1. We have sent a referral to [facility] for your MRI and they will call you directly to schedule your appt. They are located at [place]. If you need to contact them directly please call [phone]. We will send an order for valium to your pharmacy. Take one tablet 30 minutes prior to MR (on arrival to facility) and one more just prior to MR (if needed) You will need a driver.  2. Your provider has requested that you have labwork completed today. Please go to [facility] ([id number]) on the second floor of this building before leaving the office today. You do not need to check in. If you are not called within 15 minutes please check with the front desk.

## 2016-06-22 ENCOUNTER — Telehealth: Payer: Self-pay | Admitting: Neurology

## 2016-06-22 LAB — MYASTHENIA GRAVIS PANEL 2
Acetylcholine Rec Mod Ab: 5 % binding inhibition
Aceytlcholine Rec Bloc Ab: <15 % of inhibition (ref <15)

## 2016-06-22 NOTE — Telephone Encounter (Signed)
Patient made aware.

## 2016-06-22 NOTE — Telephone Encounter (Signed)
Left message on machine for patient to call back.

## 2016-06-22 NOTE — Telephone Encounter (Signed)
-----   Message from [place], DO sent at 06/22/2016  8:46 AM EDT ----- Let pt know that labs looked okay.  If MRI neg, will check MuSK to make sure we aren't missing anything (try to order via [facility] labs)

## 2016-06-30 ENCOUNTER — Inpatient Hospital Stay: Admission: RE | Admit: 2016-06-30 | Source: Ambulatory Visit

## 2016-07-13 ENCOUNTER — Ambulatory Visit
Admission: RE | Admit: 2016-07-13 | Discharge: 2016-07-13 | Disposition: A | Discharge status: Home / Self Care | Source: Ambulatory Visit | Attending: Neurology | Admitting: Neurology

## 2016-07-13 DIAGNOSIS — G43009 Migraine without aura, not intractable, without status migrainosus: Secondary | ICD-10-CM

## 2016-07-13 DIAGNOSIS — I729 Aneurysm of unspecified site: Secondary | ICD-10-CM

## 2016-07-13 DIAGNOSIS — H02402 Unspecified ptosis of left eyelid: Secondary | ICD-10-CM

## 2016-07-13 MED ORDER — GADOBENATE DIMEGLUMINE 529 MG/ML IV SOLN
20.0000 mL | Freq: Once | INTRAVENOUS | Status: AC | PRN
  Administered 2016-07-13: 20 mL via INTRAVENOUS

## 2016-07-14 ENCOUNTER — Telehealth: Payer: Self-pay | Admitting: Neurology

## 2016-07-14 NOTE — Telephone Encounter (Signed)
Patient made aware. She wants to hold off on lab test til after she sees Dr. Carles Collet is December. She will call if she changes her mind.

## 2016-07-14 NOTE — Telephone Encounter (Signed)
-----   Message from [place], DO sent at 07/14/2016  7:40 AM EDT ----- Reviewed.  Looks really normal.  Tell pt that if she wants to keep looking can do the MuSK testing.  Probably cheaper through [facility] if can do through that lab

## 2016-09-01 ENCOUNTER — Other Ambulatory Visit: Payer: Self-pay | Admitting: Neurology

## 2016-09-13 NOTE — Progress Notes (Deleted)
Subjective:   Amanda Fry was seen in consultation in the movement disorder clinic at the request of Reginia Naas, MD.  The evaluation is for tremor.  The patient is a 65 y.o. right handed female with a history of tremor.  Tremor is in the right hand and has been in that hand for several years but recently she has noted it a few times in the L hand and that has worried her.  She notes it first when she writes.  Sometimes she has to hold a cup with 2 hands.  Other people will always ask her why she is shaking.  She never notices the tremor at rest.  There is no family hx of tremor.    Affected by caffeine:  No. (2 small cups coffee per day) Affected by alcohol:  No.  Affected by stress/anxiety:  Yes.   Affected by fatigue:  No. Spills soup if on spoon:  Yes.   Spills glass of liquid if full: may or may not but generally will carry with both hands if having a bad day Affects ADL's (tying shoes, brushing teeth, etc):  No., except will note tremor with applying mascara  12/09/15 update:  The patient is following up today.  I have reviewed prior records made available to me.  She has a history of essential tremor.  She is currently on no medication for that.  She is noting that her writing is getting worse.  Husband makes pens and having trouble no matter what pen she uses its troublesome.  Eats peas with spoons.    She had an MRI brain scan in 2010 with possible evidence of aneurysm, so we repeated an MRA last visit.  This was done on 04/20/2015.  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  It had not changed since 2010.  The patient had some questions regarding this, so I referred her to Dr. Kathyrn Sheriff.  I called yesterday to get notes, and apparently the patient spoke over the phone with Dr. Cyndy Freeze and he told her not to come in and that they would follow up with her in 2 years and would do a CTA at that time.  03/16/16 update:  The patient is following  up today.  Last visit, I asked her primary care physician affairs I changed tremor metoprolol to Inderal LA, 80 mg daily.  Her primary care physician approved this and we made the change to see if that would help tremor.  The patient states that she is doing much better and BP is better as well.  Care everywhere was queried, but no additional information was noted to add to medical hx.  Noted that she is having more headaches.  Has hx of migraine and was on topamax in past.  Initially thought that weather related, but has now had for 6 months.  Feels a "swollen area" over the L nose.  Has pain (soreness) over the L temple and may shoot down face and posteriorally.  Has a few "old topamax" and will take that and it helps (least helps her get to sleep).  Coming 2-3 days a week and then last 4 hours.  Feels that motrin causes it.  Imitrex helped in the past  06/16/16 update:  The patient follows up today.  She is on Inderal LA, 80 mg daily.  She is doing well in regards to tremor.  Only one AM where she had trouble holding pen.   She  denies lightheadedness or near syncope.  She was having headache last visit and I gave her a Medrol Dosepak and a prescription for Imitrex.  She states that she has done better but she isn't sure that the medrol helped it go away.  Hasn't had to take the imitrex.  States that the medrol made her fibromyalgia better.  No diplopia but has some floaters  09/15/16 update:  Patient remains on Inderal LA, 80 mg daily for tremor.  The patient states that she is doing well in regards to tremor.  Denies any lightheadedness or near syncope.  In regards to headache, she states that she has not had to use her Imitrex.  On examination last visit, I did notice some left ptosis.  Therefore, I ordered some lab work.  Her acetylcholine receptor and bodies were negative.  We talked about musk testing, but she decided to defer.  She had an MRI of the brain since our last visit that was negative.  She had  an MRA of the brain that demonstrated a 3 mm anterior communicating artery aneurysm that was stable when compared to 2010.  We offered her an opinion from interventional radiology, but she decided to hold off on that.  Current/Previously tried tremor medications: used to be on toprol xl for headache but no longer on it     Outside reports reviewed: lab reports, office notes, radiology reports and referral letter/letters.  Allergies  Allergen Reactions  . Biaxin [Clarithromycin]   . Shellfish Allergy     Outpatient Encounter Prescriptions as of 09/15/2016  Medication Sig  . aspirin 81 MG tablet Take 81 mg by mouth daily.  . Calcium Carbonate-Vitamin D (CALCIUM 500 + D PO) Take by mouth.  . cetirizine (ZYRTEC) 10 MG tablet Take 10 mg by mouth daily.  . Cholecalciferol (VITAMIN D) 2000 UNITS tablet Take 2,000 Units by mouth daily.  . diazepam (VALIUM) 5 MG tablet Take one tablet 30 minutes prior to MR (on arrival to facility) and one more just prior to MR (if needed)  . diclofenac (VOLTAREN) 75 MG EC tablet Take 75 mg by mouth daily.   Marland Kitchen esomeprazole (NEXIUM) 20 MG capsule Take 20 mg by mouth daily at 12 noon.  [name] Oil 300 MG CAPS Take by mouth.  . levothyroxine (SYNTHROID, LEVOTHROID) 137 MCG tablet Take 137 mcg by mouth daily before breakfast.  . Magnesium 500 MG CAPS Take by mouth.  . propranolol ER (INDERAL LA) 80 MG 24 hr capsule TAKE ONE CAPSULE BY MOUTH EVERY DAY  . rosuvastatin (CRESTOR) 10 MG tablet Take 10 mg by mouth daily.  . SUMAtriptan (IMITREX) 100 MG tablet Take 1 tablet (100 mg total) by mouth once. May repeat in 2 hours if headache persists or recurs. Not to exceed 2 per day.  . zolpidem (AMBIEN CR) 6.25 MG CR tablet Take 6.25 mg by mouth at bedtime as needed for sleep.   No facility-administered encounter medications on file as of 09/15/2016.     Past Medical History:  Diagnosis Date  . Anxiety   . GERD (gastroesophageal reflux disease)   . HLD  (hyperlipidemia)   . Hypertension   . Hypothyroidism     Past Surgical History:  Procedure Laterality Date  . APPENDECTOMY  2001  . BREAST LUMPECTOMY Left 1994  . PARTIAL HYSTERECTOMY  1994    Social History   Social History  . Marital status: Married    Spouse name: N/A  . Number of children: N/A  .  Years of education: N/A   Occupational History  . Not on file.   Social History Main Topics  . Smoking status: Former Smoker    Quit date: 09/26/2009  . Smokeless tobacco: Not on file  . Alcohol use 0.0 oz/week     Comment: one drink a month  . Drug use: No  . Sexual activity: Not on file   Other Topics Concern  . Not on file   Social History Narrative  . No narrative on file    Family Status  Relation Status  . Father Deceased at age 27   heart disease  . Mother Deceased   breast cancer  . Brother Alive   healthy  . Brother Alive   healthy  . Sister Alive   DM, breast cancer  . Son Alive   healthy  . Daughter Alive   healthy    Review of Systems A complete 10 system ROS was obtained and was negative apart from what is mentioned.   Objective:   VITALS:   There were no vitals filed for this visit. Gen:  Appears stated age and in NAD. HEENT:  Normocephalic, atraumatic. The mucous membranes are moist. The superficial temporal arteries are without ropiness or tenderness. Cardiovascular: Regular rate and rhythm. Lungs: Clear to auscultation bilaterally. Neck: There are no carotid bruits noted bilaterally.  NEUROLOGICAL:  Orientation:  The patient is alert and oriented x 3.   Cranial nerves: There is good facial symmetry.  There is L ptosis. When asked to sustain prolonged upgaze the L eye slowly tracks inferiorally but no diplopia noted.  The pupils are 3 mm and both equally reactive.  Fundi revealed clear disc margins bilaterally.  Speech is fluent and clear. Soft palate rises symmetrically and there is no tongue deviation. Hearing is intact to  conversational tone. Tone: Tone is good throughout. Sensation: Sensation is intact to light touch throughout. Coordination:  The patient has no dysdiadichokinesia or dysmetria. Motor: Strength is 5/5 in the bilateral upper and lower extremities.   Gait and Station: The patient is able to ambulate without difficulty.   MOVEMENT EXAM: Tremor:  There is no significant tremor in the UE, either posturally or with intention.   Assessment/Plan:   1.  Essential Tremor.  -doing great on inderal LA, 80 mg daily. 2.  ACA aneurysm  -MRA last done on 04/20/2015.  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  It had not changed since 2010.  Dr. Christella Noa advised pt f/u in 2 years.   3.  Migraine  -improved and hasn't had to use imitrex yet. 4.  L ptosis  -Acetylcholine receptor antibodies negative.  MRI of the brain negative.  She wants to hold off on doing a MuSK given cost and lack of other symptoms. 5.  As above, I plan to see her in about 4-5 months, sooner should the above tests warrant or if new neuro issues arise.  Much greater than 50% of this visit was spent in counseling and coordinating care.  Total face to face time:  35 min

## 2016-09-15 ENCOUNTER — Ambulatory Visit: Admitting: Neurology

## 2016-10-21 NOTE — Progress Notes (Signed)
Subjective:   Amanda Fry was seen in consultation in the movement disorder clinic at the request of Reginia Naas, MD.  The evaluation is for tremor.  The patient is a 66 y.o. right handed female with a history of tremor.  Tremor is in the right hand and has been in that hand for several years but recently she has noted it a few times in the L hand and that has worried her.  She notes it first when she writes.  Sometimes she has to hold a cup with 2 hands.  Other people will always ask her why she is shaking.  She never notices the tremor at rest.  There is no family hx of tremor.    Affected by caffeine:  No. (2 small cups coffee per day) Affected by alcohol:  No.  Affected by stress/anxiety:  Yes.   Affected by fatigue:  No. Spills soup if on spoon:  Yes.   Spills glass of liquid if full: may or may not but generally will carry with both hands if having a bad day Affects ADL's (tying shoes, brushing teeth, etc):  No., except will note tremor with applying mascara  12/09/15 update:  The patient is following up today.  I have reviewed prior records made available to me.  She has a history of essential tremor.  She is currently on no medication for that.  She is noting that her writing is getting worse.  Husband makes pens and having trouble no matter what pen she uses its troublesome.  Eats peas with spoons.    She had an MRI brain scan in 2010 with possible evidence of aneurysm, so we repeated an MRA last visit.  This was done on 04/20/2015.  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  It had not changed since 2010.  The patient had some questions regarding this, so I referred her to Dr. Kathyrn Sheriff.  I called yesterday to get notes, and apparently the patient spoke over the phone with Dr. Cyndy Freeze and he told her not to come in and that they would follow up with her in 2 years and would do a CTA at that time.  03/16/16 update:  The patient is following  up today.  Last visit, I asked her primary care physician affairs I changed tremor metoprolol to Inderal LA, 80 mg daily.  Her primary care physician approved this and we made the change to see if that would help tremor.  The patient states that she is doing much better and BP is better as well.  Care everywhere was queried, but no additional information was noted to add to medical hx.  Noted that she is having more headaches.  Has hx of migraine and was on topamax in past.  Initially thought that weather related, but has now had for 6 months.  Feels a "swollen area" over the L nose.  Has pain (soreness) over the L temple and may shoot down face and posteriorally.  Has a few "old topamax" and will take that and it helps (least helps her get to sleep).  Coming 2-3 days a week and then last 4 hours.  Feels that motrin causes it.  Imitrex helped in the past  06/16/16 update:  The patient follows up today.  She is on Inderal LA, 80 mg daily.  She is doing well in regards to tremor.  Only one AM where she had trouble holding pen.   She  denies lightheadedness or near syncope.  She was having headache last visit and I gave her a Medrol Dosepak and a prescription for Imitrex.  She states that she has done better but she isn't sure that the medrol helped it go away.  Hasn't had to take the imitrex.  States that the medrol made her fibromyalgia better.  No diplopia but has some floaters  10/26/16 update:  Pt f/u today.  On inderal LA, 80 mg daily for tremor.  Well controlled without SE.  Had noted some ptosis/pseudoptosis last visit.  AchR Ab were negative.  Pt wanted to hold on MuSK.  MRI of the brain was done and was unremarkable.  I reviewed this.  C/o a pain in the L temple that is pressure like, that is intense.  No shooting quality.  Feels like it is near the side of the nose as well.  Will like it is scratchy in the eye and will use eye drops.  May last a few hours and comes and goes.  Not sure if cold brings it on.   Longest time without it is a few days.  Driving in car makes worse.    Current/Previously tried tremor medications: used to be on toprol xl for headache but no longer on it     Outside reports reviewed: lab reports, office notes, radiology reports and referral letter/letters.  Allergies  Allergen Reactions  . Biaxin [Clarithromycin]   . Shellfish Allergy     Outpatient Encounter Prescriptions as of 10/26/2016  Medication Sig  . aspirin 81 MG tablet Take 81 mg by mouth daily.  . Calcium Carbonate-Vitamin D (CALCIUM 500 + D PO) Take by mouth.  . cetirizine (ZYRTEC) 10 MG tablet Take 10 mg by mouth daily.  . Cholecalciferol (VITAMIN D) 2000 UNITS tablet Take 2,000 Units by mouth daily.  . diclofenac (VOLTAREN) 75 MG EC tablet Take 75 mg by mouth daily.   Marland Kitchen esomeprazole (NEXIUM) 20 MG capsule Take 20 mg by mouth daily at 12 noon.  [name] Oil 300 MG CAPS Take by mouth.  . levothyroxine (SYNTHROID, LEVOTHROID) 137 MCG tablet Take 137 mcg by mouth daily before breakfast.  . Magnesium 500 MG CAPS Take by mouth.  . propranolol ER (INDERAL LA) 80 MG 24 hr capsule Take 1 capsule (80 mg total) by mouth daily.  . rosuvastatin (CRESTOR) 10 MG tablet Take 10 mg by mouth daily.  . SUMAtriptan (IMITREX) 100 MG tablet Take 1 tablet (100 mg total) by mouth once. May repeat in 2 hours if headache persists or recurs. Not to exceed 2 per day.  . zolpidem (AMBIEN CR) 6.25 MG CR tablet Take 6.25 mg by mouth at bedtime as needed for sleep.  . [DISCONTINUED] propranolol ER (INDERAL LA) 80 MG 24 hr capsule TAKE ONE CAPSULE BY MOUTH EVERY DAY  . topiramate (TOPAMAX) 100 MG tablet Take 1 tablet (100 mg total) by mouth daily.  Marland Kitchen topiramate (TOPAMAX) 25 MG tablet Take one tablet daily for one week, then two tablets daily for one week, than 3 tablets daily, then switch to 100 mg tablets  . [DISCONTINUED] diazepam (VALIUM) 5 MG tablet Take one tablet 30 minutes prior to MR (on arrival to facility) and one more just  prior to MR (if needed)   No facility-administered encounter medications on file as of 10/26/2016.     Past Medical History:  Diagnosis Date  . Anxiety   . GERD (gastroesophageal reflux disease)   . HLD (hyperlipidemia)   .  Hypertension   . Hypothyroidism     Past Surgical History:  Procedure Laterality Date  . APPENDECTOMY  2001  . BREAST LUMPECTOMY Left 1994  . PARTIAL HYSTERECTOMY  1994    Social History   Social History  . Marital status: Married    Spouse name: N/A  . Number of children: N/A  . Years of education: N/A   Occupational History  . Not on file.   Social History Main Topics  . Smoking status: Former Smoker    Quit date: 09/26/2009  . Smokeless tobacco: Never Used  . Alcohol use 0.0 oz/week     Comment: one drink a month  . Drug use: No  . Sexual activity: Not on file   Other Topics Concern  . Not on file   Social History Narrative  . No narrative on file    Family Status  Relation Status  . Father Deceased at age 46   heart disease  . Mother Deceased   breast cancer  . Brother Alive   healthy  . Brother Alive   healthy  . Sister Alive   DM, breast cancer  . Son Alive   healthy  . Daughter Alive   healthy    Review of Systems A complete 10 system ROS was obtained and was negative apart from what is mentioned.   Objective:   VITALS:   Vitals:   10/26/16 1021  BP: 116/60  Pulse: 70  Weight: 207 lb (93.9 kg)  Height: 5' 7.5" (1.715 m)   Gen:  Appears stated age and in NAD. HEENT:  Normocephalic, atraumatic. The mucous membranes are moist. The superficial temporal arteries are without ropiness or tenderness. Cardiovascular: Regular rate and rhythm. Lungs: Clear to auscultation bilaterally. Neck: There are no carotid bruits noted bilaterally.  NEUROLOGICAL:  Orientation:  The patient is alert and oriented x 3.   Cranial nerves: There is good facial symmetry.  There is L ptosis. When asked to sustain prolonged upgaze the L  eye slowly tracks inferiorally but no diplopia noted.  The pupils are 3 mm and both equally reactive.  Fundi revealed clear disc margins bilaterally.  Speech is fluent and clear. Soft palate rises symmetrically and there is no tongue deviation. Hearing is intact to conversational tone. Tone: Tone is good throughout. Sensation: Sensation is intact to light touch throughout. Coordination:  The patient has no dysdiadichokinesia or dysmetria. Motor: Strength is 5/5 in the bilateral upper and lower extremities.   Gait and Station: The patient is able to ambulate without difficulty.   MOVEMENT EXAM: Tremor:  There is no significant tremor in the UE, either posturally or with intention.   Assessment/Plan:   1.  Essential Tremor.  -doing great on inderal LA, 80 mg daily. 2.  ACA aneurysm  -MRA last done on 04/20/2015.  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  It had not changed since 2010.  Dr. Christella Noa advised pt f/u in 2 years.   3.  Migraine  -improved and hasn't had to use imitrex yet. 4.  Possible L trigeminal neuralgia  -was going to try trileptal but tried topamax in the past and helped.  This sounds more like TN than migraine.  Will start topamax and work to 100 mg q hs.  Risks, benefits, side effects and alternative therapies were discussed.  The opportunity to ask questions was given and they were answered to the best of my ability.  The patient expressed understanding  and willingness to follow the outlined treatment protocols. 4.  L ptosis versus pseudoptosis  -I don't remember seeing this in the past and after I mentioned it the patient stated that she has just started to notice this in pictures.  She is not sure if it is fluctuating.  Does state that the L side of her face just doesn't feel like the right.   Had some trouble maintaining prolonged upgaze.  -AchR antibodies negative.  Patient wants to hold on MuSK  -MRI brain negative  -make [place] opth  appt.  Names/number given.  Thinks may need PCP referral with her insurance ([employer]) 5.  As above, I plan to see her in about 3 months.  Much greater than 50% of this visit was spent in counseling and coordinating care.  Total face to face time:  35 min

## 2016-10-26 ENCOUNTER — Encounter: Payer: Self-pay | Admitting: Neurology

## 2016-10-26 ENCOUNTER — Ambulatory Visit (INDEPENDENT_AMBULATORY_CARE_PROVIDER_SITE_OTHER): Admitting: Neurology

## 2016-10-26 VITALS — BP 116/60 | HR 70 | Ht 67.5 in | Wt 207.0 lb

## 2016-10-26 DIAGNOSIS — G250 Essential tremor: Secondary | ICD-10-CM | POA: Diagnosis not present

## 2016-10-26 DIAGNOSIS — G500 Trigeminal neuralgia: Secondary | ICD-10-CM | POA: Diagnosis not present

## 2016-10-26 MED ORDER — TOPIRAMATE 25 MG PO TABS: ORAL_TABLET | ORAL | 0 refills | Status: DC

## 2016-10-26 MED ORDER — PROPRANOLOL HCL ER 80 MG PO CP24: 80.0000 mg | ORAL_CAPSULE | Freq: Every day | ORAL | 0 refills | Status: DC

## 2016-10-26 MED ORDER — TOPIRAMATE 100 MG PO TABS: 100.0000 mg | ORAL_TABLET | Freq: Every day | ORAL | 1 refills | Status: DC

## 2016-10-26 NOTE — Patient Instructions (Addendum)
Call [facility] (Dr. Prudencio Burly, [place], [place])  Start topamax 25 mg 1 tablet daily for 1 week, then 2 tablets daily for 1 week and then 3 tablets daily for 1 week and then fill the 100 mg topamax and take 1 tablet a night.  If you get relief at lower dosage, let me know.

## 2016-11-21 ENCOUNTER — Other Ambulatory Visit: Payer: Self-pay | Admitting: Neurology

## 2016-11-22 ENCOUNTER — Other Ambulatory Visit: Payer: Self-pay | Admitting: Neurology

## 2016-12-20 ENCOUNTER — Other Ambulatory Visit: Payer: Self-pay | Admitting: Neurology

## 2017-01-20 NOTE — Progress Notes (Signed)
Subjective:   Amanda Fry was seen in consultation in the movement disorder clinic at the request of Reginia Naas, MD.  The evaluation is for tremor.  The patient is a 66 y.o. right handed female with a history of tremor.  Tremor is in the right hand and has been in that hand for several years but recently she has noted it a few times in the L hand and that has worried her.  She notes it first when she writes.  Sometimes she has to hold a cup with 2 hands.  Other people will always ask her why she is shaking.  She never notices the tremor at rest.  There is no family hx of tremor.    Affected by caffeine:  No. (2 small cups coffee per day) Affected by alcohol:  No.  Affected by stress/anxiety:  Yes.   Affected by fatigue:  No. Spills soup if on spoon:  Yes.   Spills glass of liquid if full: may or may not but generally will carry with both hands if having a bad day Affects ADL's (tying shoes, brushing teeth, etc):  No., except will note tremor with applying mascara  12/09/15 update:  The patient is following up today.  I have reviewed prior records made available to me.  She has a history of essential tremor.  She is currently on no medication for that.  She is noting that her writing is getting worse.  Husband makes pens and having trouble no matter what pen she uses its troublesome.  Eats peas with spoons.    She had an MRI brain scan in 2010 with possible evidence of aneurysm, so we repeated an MRA last visit.  This was done on 04/20/2015.  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  It had not changed since 2010.  The patient had some questions regarding this, so I referred her to Dr. Kathyrn Sheriff.  I called yesterday to get notes, and apparently the patient spoke over the phone with Dr. Cyndy Freeze and he told her not to come in and that they would follow up with her in 2 years and would do a CTA at that time.  03/16/16 update:  The patient is following  up today.  Last visit, I asked her primary care physician affairs I changed tremor metoprolol to Inderal LA, 80 mg daily.  Her primary care physician approved this and we made the change to see if that would help tremor.  The patient states that she is doing much better and BP is better as well.  Care everywhere was queried, but no additional information was noted to add to medical hx.  Noted that she is having more headaches.  Has hx of migraine and was on topamax in past.  Initially thought that weather related, but has now had for 6 months.  Feels a "swollen area" over the L nose.  Has pain (soreness) over the L temple and may shoot down face and posteriorally.  Has a few "old topamax" and will take that and it helps (least helps her get to sleep).  Coming 2-3 days a week and then last 4 hours.  Feels that motrin causes it.  Imitrex helped in the past  06/16/16 update:  The patient follows up today.  She is on Inderal LA, 80 mg daily.  She is doing well in regards to tremor.  Only one AM where she had trouble holding pen.   She  denies lightheadedness or near syncope.  She was having headache last visit and I gave her a Medrol Dosepak and a prescription for Imitrex.  She states that she has done better but she isn't sure that the medrol helped it go away.  Hasn't had to take the imitrex.  States that the medrol made her fibromyalgia better.  No diplopia but has some floaters  10/26/16 update:  Pt f/u today.  On inderal LA, 80 mg daily for tremor.  Well controlled without SE.  Had noted some ptosis/pseudoptosis last visit.  AchR Ab were negative.  Pt wanted to hold on MuSK.  MRI of the brain was done and was unremarkable.  I reviewed this.  C/o a pain in the L temple that is pressure like, that is intense.  No shooting quality.  Feels like it is near the side of the nose as well.  Will like it is scratchy in the eye and will use eye drops.  May last a few hours and comes and goes.  Not sure if cold brings it on.   Longest time without it is a few days.  Driving in car makes worse.    01/24/17 update:  Pt f/u today.  She in on inderal LA 80 mg daily.  Doing well with that.  No lightheadedness.  Last visit, we started her on topamax and tried to work up to 100 mg but she stated that was too strong (states that it affected her equilibrium and she didn't like the paresthesias) so she takes 1/4 to 1/2 of the tablet and her face feels better.  She asks for a RX for a 50mg  tablet.  Has lost weight with the topamax as lost appetite but is exercising and watching her diet.  Cholesterol came down 95 points.    Current/Previously tried tremor medications: used to be on toprol xl for headache but no longer on it; topamax - 100mg  affected her equilibrium and cognition and caused paresthesias    Outside reports reviewed: lab reports, office notes, radiology reports and referral letter/letters.  Allergies  Allergen Reactions  . Biaxin [Clarithromycin]   . Shellfish Allergy     Outpatient Encounter Prescriptions as of 01/24/2017  Medication Sig  . aspirin 81 MG tablet Take 81 mg by mouth daily.  . Calcium Carbonate-Vitamin D (CALCIUM 500 + D PO) Take by mouth.  . cetirizine (ZYRTEC) 10 MG tablet Take 10 mg by mouth daily.  . Cholecalciferol (VITAMIN D) 2000 UNITS tablet Take 2,000 Units by mouth daily.  . diclofenac (VOLTAREN) 75 MG EC tablet Take 75 mg by mouth daily.   . Magnesium 500 MG CAPS Take by mouth.  . propranolol ER (INDERAL LA) 80 MG 24 hr capsule Take 1 capsule (80 mg total) by mouth daily.  . ranitidine (ZANTAC) 150 MG tablet Take 150 mg by mouth 2 (two) times daily.  . rosuvastatin (CRESTOR) 10 MG tablet Take 10 mg by mouth daily.  Marland Kitchen SYNTHROID 112 MCG tablet Take 112 mcg by mouth daily.  Marland Kitchen topiramate (TOPAMAX) 25 MG tablet Take one tablet daily for one week, then two tablets daily for one week, than 3 tablets daily, then switch to 100 mg tablets (Patient taking differently: Take 25-50 mg by mouth daily.  )  . zolpidem (AMBIEN CR) 6.25 MG CR tablet Take 6.25 mg by mouth at bedtime as needed for sleep.  . SUMAtriptan (IMITREX) 100 MG tablet Take 1 tablet (100 mg total) by mouth once. May repeat in 2  hours if headache persists or recurs. Not to exceed 2 per day. (Patient not taking: Reported on 01/24/2017)  . [DISCONTINUED] esomeprazole (NEXIUM) 20 MG capsule Take 20 mg by mouth daily at 12 noon.  . [DISCONTINUED] Krill Oil 300 MG CAPS Take by mouth.  . [DISCONTINUED] levothyroxine (SYNTHROID, LEVOTHROID) 137 MCG tablet Take 137 mcg by mouth daily before breakfast.  . [DISCONTINUED] topiramate (TOPAMAX) 100 MG tablet TAKE 1 TABLET (100 MG TOTAL) BY MOUTH DAILY.   No facility-administered encounter medications on file as of 01/24/2017.     Past Medical History:  Diagnosis Date  . Anxiety   . GERD (gastroesophageal reflux disease)   . HLD (hyperlipidemia)   . Hypertension   . Hypothyroidism     Past Surgical History:  Procedure Laterality Date  . APPENDECTOMY  2001  . BREAST LUMPECTOMY Left 1994  . PARTIAL HYSTERECTOMY  1994    Social History   Social History  . Marital status: Married    Spouse name: N/A  . Number of children: N/A  . Years of education: N/A   Occupational History  . Not on file.   Social History Main Topics  . Smoking status: Former Smoker    Quit date: 09/26/2009  . Smokeless tobacco: Never Used  . Alcohol use 0.0 oz/week     Comment: one drink a month  . Drug use: No  . Sexual activity: Not on file   Other Topics Concern  . Not on file   Social History Narrative  . No narrative on file    Family Status  Relation Status  . Father Deceased at age 97   heart disease  . Mother Deceased   breast cancer  . Brother Alive   healthy  . Brother Alive   healthy  . Sister Alive   DM, breast cancer  . Son Alive   healthy  . Daughter Alive   healthy    Review of Systems A complete 10 system ROS was obtained and was negative apart from what is  mentioned.   Objective:   VITALS:   Vitals:   01/24/17 1038  BP: 120/62  Pulse: 82  SpO2: 98%  Weight: 195 lb (88.5 kg)  Height: 5\' 8"  (1.727 m)   Wt Readings from Last 3 Encounters:  01/24/17 195 lb (88.5 kg)  10/26/16 207 lb (93.9 kg)  06/16/16 209 lb (94.8 kg)    Gen:  Appears stated age and in NAD. HEENT:  Normocephalic, atraumatic. The mucous membranes are moist. The superficial temporal arteries are without ropiness or tenderness. Cardiovascular: Regular rate and rhythm. Lungs: Clear to auscultation bilaterally. Neck: There are no carotid bruits noted bilaterally.  NEUROLOGICAL:  Orientation:  The patient is alert and oriented x 3.   Cranial nerves: There is good facial symmetry.  There is L ptosis. When asked to sustain prolonged upgaze the L eye slowly tracks inferiorally but no diplopia noted.  The pupils are 3 mm and both equally reactive.  Fundi revealed clear disc margins bilaterally.  Speech is fluent and clear. Soft palate rises symmetrically and there is no tongue deviation. Hearing is intact to conversational tone. Tone: Tone is good throughout. Sensation: Sensation is intact to light touch throughout. Coordination:  The patient has no dysdiadichokinesia or dysmetria. Motor: Strength is 5/5 in the bilateral upper and lower extremities.   Gait and Station: The patient is able to ambulate without difficulty.   MOVEMENT EXAM: Tremor:  There is no significant tremor in the UE,  either posturally or with intention.   Assessment/Plan:   1.  Essential Tremor.  -doing great on inderal LA, 80 mg daily. 2.  ACA aneurysm  -MRA last done on 04/20/2015.  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  It had not changed since 2010.  Dr. Christella Noa advised pt f/u in 2 years.   3.  Migraine  -improved and hasn't had to use imitrex yet. 4.  Possible L trigeminal neuralgia  -was going to try trileptal but tried topamax in the past and helped.   100 mg of topamax caused cognitive change and asks for 50 mg dosage instead.  It is working.   4.  L ptosis versus pseudoptosis  -I don't remember seeing this in the past and after I mentioned it the patient stated that she has just started to notice this in pictures.  She is not sure if it is fluctuating.  Does state that the L side of her face just doesn't feel like the right.   Had some trouble maintaining prolonged upgaze.  -AchR antibodies negative.  Patient wants to hold on MuSK  -MRI brain negative  -make [place] opth appt.  Names/number given.  Thinks may need PCP referral with her insurance ([employer]) and couldn't find one that took insurance so waiting until November when gets MCR 5.  As above, I plan to see her in about 3 months.  Much greater than 50% of this visit was spent in counseling and coordinating care.  Total face to face time:  25 min

## 2017-01-24 ENCOUNTER — Encounter: Payer: Self-pay | Admitting: Neurology

## 2017-01-24 ENCOUNTER — Ambulatory Visit (INDEPENDENT_AMBULATORY_CARE_PROVIDER_SITE_OTHER): Admitting: Neurology

## 2017-01-24 VITALS — BP 120/62 | HR 82 | Ht 68.0 in | Wt 195.0 lb

## 2017-01-24 DIAGNOSIS — G250 Essential tremor: Secondary | ICD-10-CM | POA: Diagnosis not present

## 2017-01-24 DIAGNOSIS — G43009 Migraine without aura, not intractable, without status migrainosus: Secondary | ICD-10-CM | POA: Diagnosis not present

## 2017-01-24 MED ORDER — TOPIRAMATE 50 MG PO TABS: 50.0000 mg | ORAL_TABLET | Freq: Every day | ORAL | 1 refills | Status: DC

## 2017-01-24 MED ORDER — PROPRANOLOL HCL ER 80 MG PO CP24: 80.0000 mg | ORAL_CAPSULE | Freq: Every day | ORAL | 1 refills | Status: AC

## 2017-07-20 ENCOUNTER — Other Ambulatory Visit: Payer: Self-pay | Admitting: Neurology

## 2017-07-31 ENCOUNTER — Encounter: Payer: Self-pay | Admitting: Neurology

## 2017-07-31 ENCOUNTER — Ambulatory Visit (INDEPENDENT_AMBULATORY_CARE_PROVIDER_SITE_OTHER): Payer: Medicare Other | Admitting: Neurology

## 2017-07-31 VITALS — BP 138/82 | HR 70 | Ht 67.5 in | Wt 194.0 lb

## 2017-07-31 DIAGNOSIS — G500 Trigeminal neuralgia: Secondary | ICD-10-CM

## 2017-07-31 DIAGNOSIS — G250 Essential tremor: Secondary | ICD-10-CM | POA: Diagnosis not present
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
## 2017-08-03 DIAGNOSIS — Z1389 Encounter for screening for other disorder: Secondary | ICD-10-CM | POA: Diagnosis not present

## 2017-08-03 DIAGNOSIS — E039 Hypothyroidism, unspecified: Secondary | ICD-10-CM | POA: Diagnosis not present

## 2017-08-03 DIAGNOSIS — K219 Gastro-esophageal reflux disease without esophagitis: Secondary | ICD-10-CM | POA: Diagnosis not present

## 2017-08-03 DIAGNOSIS — Z23 Encounter for immunization: Secondary | ICD-10-CM | POA: Diagnosis not present

## 2017-08-03 DIAGNOSIS — M797 Fibromyalgia: Secondary | ICD-10-CM | POA: Diagnosis not present

## 2017-08-03 DIAGNOSIS — I10 Essential (primary) hypertension: Secondary | ICD-10-CM | POA: Diagnosis not present

## 2017-08-03 DIAGNOSIS — E782 Mixed hyperlipidemia: Secondary | ICD-10-CM | POA: Diagnosis not present

## 2017-08-03 DIAGNOSIS — Z8249 Family history of ischemic heart disease and other diseases of the circulatory system: Secondary | ICD-10-CM | POA: Diagnosis not present

## 2017-08-03 DIAGNOSIS — Z0000 Encounter for general adult medical examination without abnormal findings: Secondary | ICD-10-CM | POA: Diagnosis not present

## 2017-08-03 DIAGNOSIS — G4700 Insomnia, unspecified: Secondary | ICD-10-CM | POA: Diagnosis not present

## 2017-08-03 DIAGNOSIS — Z1159 Encounter for screening for other viral diseases: Secondary | ICD-10-CM | POA: Diagnosis not present

## 2017-08-04 DIAGNOSIS — Z1231 Encounter for screening mammogram for malignant neoplasm of breast: Secondary | ICD-10-CM | POA: Diagnosis not present

## 2017-08-04 DIAGNOSIS — Z803 Family history of malignant neoplasm of breast: Secondary | ICD-10-CM | POA: Diagnosis not present

## 2017-08-14 NOTE — Progress Notes (Signed)
Cardiology Office Note   Date:  08/15/2017   ID:  Amanda Fry, DOB 07-Mar-1951, MRN [id number]  PCP:  Amanda Ada, MD  Cardiologist:   Amanda Rouge, MD   No chief complaint on file.     History of Present Illness: Amanda Fry is a 66 y.o. female who presents for coronary artery risk stratification. Referred by Amanda Fry. She has a family history of CAD and a sister with high calcium score and HLD and HTN  She is on chronic beta blocker for essential tremor followed by Amanda Amanda Fry. 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 No pain below knees Thinks she may have lower back issue She is interested in preventive measures   Past Medical History:  Diagnosis Date  . Allergic rhinitis   . Anterior communicating artery aneurysm   . Anxiety   . Carpal tunnel syndrome   . Cyst of kidney, acquired   . Esophageal reflux   . Fibromyalgia   . GERD (gastroesophageal reflux disease)   . HLD (hyperlipidemia)   . Hypertension   . Hypothyroidism   . Insomnia     Past Surgical History:  Procedure Laterality Date  . APPENDECTOMY  2001  . BREAST LUMPECTOMY Left 1994  . PARTIAL HYSTERECTOMY  1994     Current Outpatient Medications  Medication Sig Dispense Refill  . aspirin 81 MG tablet Take 81 mg by mouth daily.    . calcium carbonate (OSCAL) 1500 (600 Ca) MG TABS tablet Take 2 (two) times daily with a meal by mouth.    . Calcium Carbonate-Vitamin D (CALCIUM 500 + D PO) Take by mouth.    . cetirizine (ZYRTEC) 10 MG tablet Take 10 mg by mouth daily.    . Cholecalciferol (VITAMIN D) 2000 UNITS tablet Take 2,000 Units by mouth daily.    . diclofenac (VOLTAREN) 75 MG EC tablet Take 75 mg by mouth  daily.     . Glucos-Chond-Hyal Ac-Ca Fructo (MOVE FREE JOINT HEALTH ADVANCE PO) Take daily by mouth.    Amanda Fry Oil 300 MG CAPS Take 300 mg by mouth.    . Magnesium 500 MG CAPS Take by mouth.    . propranolol ER (INDERAL LA) 80 MG 24 hr capsule Take 1 capsule (80 mg total) by mouth daily. 90 capsule 1  . ranitidine (ZANTAC) 150 MG tablet Take 150 mg by mouth 2 (two) times daily.    . rosuvastatin (CRESTOR) 10 MG tablet Take 10 mg by mouth daily.    . SUMAtriptan (IMITREX) 100 MG tablet Take 1 tablet (100 mg total) by mouth once. May repeat in 2 hours if headache persists or recurs. Not to exceed 2 per day. 9 tablet 0  . SYNTHROID 112 MCG tablet Take 112 mcg by mouth daily.  0  . topiramate (TOPAMAX) 50 MG tablet TAKE 1 TABLET BY MOUTH EVERY DAY (Patient taking differently: TAKE 1/2 TABLET BY MOUTH EVERY DAY) 30 tablet 0  . zolpidem (AMBIEN) 5 MG tablet Take 5 mg at bedtime by mouth.     No current facility-administered medications for this visit.     Allergies:   Augmentin [amoxicillin-pot clavulanate]; Biaxin [clarithromycin]; and  Shellfish allergy    Social History:  The patient  reports that she quit smoking about 7 years ago. she has never used smokeless tobacco. She reports that she drinks alcohol. She reports that she does not use drugs.   Family History:  The patient's family history includes CAD (age of onset: 68) in her father; Cancer in her mother and sister; Diabetes in her sister; Emphysema in her paternal grandfather; Healthy in her brother, brother, daughter, and son; Heart disease in her maternal grandfather and maternal grandmother.    ROS:  Please see the history of present illness.   Otherwise, review of systems are positive for none.   All other systems are reviewed and negative.    PHYSICAL EXAM: VS:  BP 128/86   Pulse 77   Ht 5' 7.5" (1.715 m)   Wt 192 lb 4 oz (87.2 kg)   SpO2 98%   BMI 29.67 kg/m  , BMI Body mass index is 29.67 kg/m. Affect  appropriate Healthy:  appears stated age 53: normal Neck supple with no adenopathy JVP normal no bruits no thyromegaly Lungs clear with no wheezing and good diaphragmatic motion Heart:  S1/S2 no murmur, no rub, gallop or click PMI normal Abdomen: benighn, BS positve, no tenderness, no AAA no bruit.  No HSM or HJR Distal pulses intact with no bruits No edema Neuro non-focal Skin warm and dry No muscular weakness    EKG:  08/07/17 SR rate 78 normal ECG 08/07/17    Recent Labs: No results found for requested labs within last 8760 hours.    Lipid Panel No results found for: CHOL, TRIG, HDL, CHOLHDL, VLDL, LDLCALC, LDLDIRECT    Wt Readings from Last 3 Encounters:  08/15/17 192 lb 4 oz (87.2 kg)  07/31/17 194 lb (88 kg)  01/24/17 195 lb (88.5 kg)      Other studies Reviewed: Additional studies/ records that were reviewed today include: Notes primary labs and ECG .    ASSESSMENT AND PLAN:  1.  CAD risk calcium score ordered today no chest pain  2. HLD LDL 125 will see if she needs more intense Rx based on calcium score If so would need to change to lipitor 40 mg as she cannot tolerate crestor dose more than 10mg  3. HTN Well controlled.  Continue current medications and low sodium Dash type diet.   4. Thyroid continue replacement labs with primary  5. GERD discussed low carb diet and weight loss  6. Fibromyalgia consider cymbalta f/u primary  7. Leg pain: not PAD normal pulses neuropathic or related to lumbar back issues f/u ortho and consider Lumbar spine MRI she has seen Amanda Fry in past    Current medicines are reviewed at length with the patient today.  The patient does not have concerns regarding medicines.  The following changes have been made:  no change  Labs/ tests ordered today include: Calcium Score   Orders Placed This Encounter  Procedures  . CT CARDIAC SCORING  . EKG 12-Lead     Disposition:   FU with cardiology PRN      Signed, Amanda Rouge, MD  08/15/2017 11:18 AM    [address], [place], [place]  [address] Phone: [phone]; Fax: [phone]

## 2017-08-15 ENCOUNTER — Ambulatory Visit (INDEPENDENT_AMBULATORY_CARE_PROVIDER_SITE_OTHER)
Admission: RE | Admit: 2017-08-15 | Discharge: 2017-08-15 | Disposition: A | Discharge status: Home / Self Care | Payer: Self-pay | Source: Ambulatory Visit | Attending: Cardiovascular Disease | Admitting: Cardiovascular Disease

## 2017-08-15 ENCOUNTER — Encounter: Payer: Self-pay | Admitting: Cardiovascular Disease

## 2017-08-15 ENCOUNTER — Ambulatory Visit (INDEPENDENT_AMBULATORY_CARE_PROVIDER_SITE_OTHER): Payer: Medicare Other | Admitting: Cardiovascular Disease

## 2017-08-15 ENCOUNTER — Encounter (INDEPENDENT_AMBULATORY_CARE_PROVIDER_SITE_OTHER): Payer: Self-pay

## 2017-08-15 ENCOUNTER — Telehealth: Payer: Self-pay | Admitting: *Deleted

## 2017-08-15 VITALS — BP 128/86 | HR 77 | Ht 67.5 in | Wt 192.2 lb

## 2017-08-15 DIAGNOSIS — E785 Hyperlipidemia, unspecified: Secondary | ICD-10-CM

## 2017-08-15 DIAGNOSIS — Z8249 Family history of ischemic heart disease and other diseases of the circulatory system: Secondary | ICD-10-CM

## 2017-08-15 DIAGNOSIS — R931 Abnormal findings on diagnostic imaging of heart and coronary circulation: Secondary | ICD-10-CM

## 2017-08-15 MED ORDER — ATORVASTATIN CALCIUM 40 MG PO TABS: 40.0000 mg | ORAL_TABLET | Freq: Every day | ORAL | 1 refills | Status: AC

## 2017-08-15 NOTE — Patient Instructions (Addendum)
Medication Instructions:  Your physician recommends that you continue on your current medications as directed. Please refer to the Current Medication list given to you today.    Labwork: None   Testing/Procedures: CT cardiac scoring--TODAY PLEASE.  Dr Johnsie Cancel will let you know if you need further testing after this has been completed.   Follow-Up: As needed with Dr Johnsie Cancel.       If you need a refill on your cardiac medications before your next appointment, please call your pharmacy.

## 2017-08-15 NOTE — Telephone Encounter (Signed)
Notes recorded by Josue Hector, MD on 08/15/2017 at 1:46 PM EST Calcium score is high She is on 10 mg crestor and cannot tolerate more. Would check lipids if not done last 3 months and change to lipitor 40 mg Schedule f/u lexiscan myouve since calcium score over 400   08/15/17--Pt had lab done at [facility] 08/03/17--results from [facility]--Dr Johnsie Cancel has reviewed these results.  Chol total 198 HDL 42 LDL 125 Trig 152  I have discussed results and recommendations with pt, she verbalized understanding, fasting lipid/liver profile scheduled for 10/17/17 per Dr Johnsie Cancel, I will forward message to [facility] to contact pt to scheduled lexiscan myoview.

## 2017-08-18 ENCOUNTER — Other Ambulatory Visit: Payer: Self-pay | Admitting: Neurology

## 2017-08-21 ENCOUNTER — Telehealth: Payer: Self-pay

## 2017-08-21 NOTE — Telephone Encounter (Signed)
Called patient with instructions for lexiscan. Patient verbalized understanding.

## 2017-08-24 ENCOUNTER — Telehealth (HOSPITAL_COMMUNITY): Payer: Self-pay | Admitting: *Deleted

## 2017-08-24 NOTE — Telephone Encounter (Signed)
Patient given detailed instructions per Myocardial Perfusion Study Information Sheet for the test on  08/29/17. Patient notified to arrive 15 minutes early and that it is imperative to arrive on time for appointment to keep from having the test rescheduled.  If you need to cancel or reschedule your appointment, please call the office within 24 hours of your appointment. . Patient verbalized understanding. Kirstie Peri, RN

## 2017-08-29 ENCOUNTER — Ambulatory Visit (HOSPITAL_COMMUNITY): Payer: Medicare Other | Attending: Cardiology

## 2017-08-29 DIAGNOSIS — I2510 Atherosclerotic heart disease of native coronary artery without angina pectoris: Secondary | ICD-10-CM | POA: Diagnosis not present

## 2017-08-29 DIAGNOSIS — R079 Chest pain, unspecified: Secondary | ICD-10-CM | POA: Diagnosis not present

## 2017-08-29 DIAGNOSIS — Z8249 Family history of ischemic heart disease and other diseases of the circulatory system: Secondary | ICD-10-CM | POA: Insufficient documentation

## 2017-08-29 DIAGNOSIS — E785 Hyperlipidemia, unspecified: Secondary | ICD-10-CM | POA: Diagnosis not present

## 2017-08-29 DIAGNOSIS — I6529 Occlusion and stenosis of unspecified carotid artery: Secondary | ICD-10-CM | POA: Insufficient documentation

## 2017-08-29 DIAGNOSIS — R931 Abnormal findings on diagnostic imaging of heart and coronary circulation: Secondary | ICD-10-CM

## 2017-08-29 LAB — MYOCARDIAL PERFUSION IMAGING
CHL CUP RESTING HR STRESS: 64 {beats}/min
LHR: 0.29
LVDIAVOL: 59 mL (ref 46–106)
LVSYSVOL: 19 mL
NUC STRESS TID: 0.92
Peak HR: 99 {beats}/min
SDS: 6
SRS: 6
SSS: 12

## 2017-08-29 MED ORDER — REGADENOSON 0.4 MG/5ML IV SOLN
0.4000 mg | Freq: Once | INTRAVENOUS | Status: AC
  Administered 2017-08-29: 0.4 mg via INTRAVENOUS

## 2017-08-29 MED ORDER — TECHNETIUM TC 99M TETROFOSMIN IV KIT
10.7000 | PACK | Freq: Once | INTRAVENOUS | Status: AC | PRN
  Administered 2017-08-29: 10.7 via INTRAVENOUS
  Filled 2017-08-29: qty 11

## 2017-08-29 MED ORDER — AMINOPHYLLINE 25 MG/ML IV SOLN
75.0000 mg | Freq: Once | INTRAVENOUS | Status: AC
  Administered 2017-08-29: 75 mg via INTRAVENOUS

## 2017-08-29 MED ORDER — TECHNETIUM TC 99M TETROFOSMIN IV KIT
33.0000 | PACK | Freq: Once | INTRAVENOUS | Status: AC | PRN
  Administered 2017-08-29: 33 via INTRAVENOUS
  Filled 2017-08-29: qty 33

## 2017-10-17 ENCOUNTER — Other Ambulatory Visit: Payer: Medicare Other

## 2017-10-17 DIAGNOSIS — R931 Abnormal findings on diagnostic imaging of heart and coronary circulation: Secondary | ICD-10-CM

## 2017-10-17 DIAGNOSIS — E785 Hyperlipidemia, unspecified: Secondary | ICD-10-CM

## 2017-10-17 LAB — LIPID PANEL
CHOL/HDL RATIO: 3.7 ratio (ref 0.0–4.4)
CHOLESTEROL TOTAL: 157 mg/dL (ref 100–199)
HDL: 42 mg/dL (ref >39)
LDL Calculated: 87 mg/dL (ref 0–99)
Triglycerides: 140 mg/dL (ref 0–149)
VLDL Cholesterol Cal: 28 mg/dL (ref 5–40)

## 2017-10-17 LAB — HEPATIC FUNCTION PANEL
ALBUMIN: 4.3 g/dL (ref 3.6–4.8)
ALT: 36 IU/L — ABNORMAL HIGH (ref 0–32)
AST: 27 IU/L (ref 0–40)
Alkaline Phosphatase: 59 IU/L (ref 39–117)
BILIRUBIN TOTAL: 0.2 mg/dL (ref 0.0–1.2)
Bilirubin, Direct: 0.06 mg/dL (ref 0.00–0.40)
TOTAL PROTEIN: 6.2 g/dL (ref 6.0–8.5)

## 2017-12-14 ENCOUNTER — Other Ambulatory Visit: Payer: Self-pay

## 2017-12-14 MED ORDER — TOPIRAMATE 50 MG PO TABS: 50.0000 mg | ORAL_TABLET | Freq: Every day | ORAL | 1 refills | Status: DC

## 2018-01-31 DIAGNOSIS — G4700 Insomnia, unspecified: Secondary | ICD-10-CM | POA: Diagnosis not present

## 2018-01-31 DIAGNOSIS — E039 Hypothyroidism, unspecified: Secondary | ICD-10-CM | POA: Diagnosis not present

## 2018-01-31 DIAGNOSIS — E782 Mixed hyperlipidemia: Secondary | ICD-10-CM | POA: Diagnosis not present

## 2018-01-31 DIAGNOSIS — I10 Essential (primary) hypertension: Secondary | ICD-10-CM | POA: Diagnosis not present

## 2018-01-31 DIAGNOSIS — M797 Fibromyalgia: Secondary | ICD-10-CM | POA: Diagnosis not present

## 2018-01-31 DIAGNOSIS — K219 Gastro-esophageal reflux disease without esophagitis: Secondary | ICD-10-CM | POA: Diagnosis not present

## 2018-03-19 DIAGNOSIS — Z23 Encounter for immunization: Secondary | ICD-10-CM | POA: Diagnosis not present
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
## 2018-04-02 ENCOUNTER — Ambulatory Visit (INDEPENDENT_AMBULATORY_CARE_PROVIDER_SITE_OTHER): Payer: Medicare Other | Admitting: Neurology

## 2018-04-02 ENCOUNTER — Encounter: Payer: Self-pay | Admitting: Neurology

## 2018-04-02 VITALS — BP 122/80 | HR 76 | Ht 67.5 in | Wt 191.0 lb

## 2018-04-02 DIAGNOSIS — G500 Trigeminal neuralgia: Secondary | ICD-10-CM | POA: Diagnosis not present

## 2018-04-02 DIAGNOSIS — R931 Abnormal findings on diagnostic imaging of heart and coronary circulation: Secondary | ICD-10-CM

## 2018-04-02 DIAGNOSIS — G250 Essential tremor: Secondary | ICD-10-CM | POA: Diagnosis not present

## 2018-04-02 DIAGNOSIS — I729 Aneurysm of unspecified site: Secondary | ICD-10-CM

## 2018-04-05 ENCOUNTER — Other Ambulatory Visit (HOSPITAL_COMMUNITY): Payer: Self-pay | Admitting: Interventional Radiology

## 2018-04-05 DIAGNOSIS — I729 Aneurysm of unspecified site: Secondary | ICD-10-CM

## 2018-04-10 ENCOUNTER — Ambulatory Visit (HOSPITAL_COMMUNITY)
Admission: RE | Admit: 2018-04-10 | Discharge: 2018-04-10 | Disposition: A | Discharge status: Home / Self Care | Payer: Medicare Other | Source: Ambulatory Visit | Attending: Interventional Radiology | Admitting: Interventional Radiology

## 2018-04-10 ENCOUNTER — Ambulatory Visit (HOSPITAL_COMMUNITY): Payer: Medicare Other

## 2018-04-10 DIAGNOSIS — I729 Aneurysm of unspecified site: Secondary | ICD-10-CM

## 2018-04-10 DIAGNOSIS — I671 Cerebral aneurysm, nonruptured: Secondary | ICD-10-CM | POA: Diagnosis not present

## 2018-04-10 NOTE — Consult Note (Signed)
Chief Complaint: Patient was seen in consultation today for ACOM aneurysm.  Referring Physician(s): Tat, Eustace Quail  Supervising Physician: Luanne Bras  Patient Status: [facility] - Out-pt  History of Present Illness: Amanda Fry is a 67 y.o. female with a past medical history of hypertension, hyperlipidemia, hypothyroidism, GERD, kidney cyst, fibromyalgia, carpal tunnel syndrome, allergic rhinitis, insomnia, and anxiety. She has been followed by Dr. Carles Collet for management of essential tremor. She underwent MRI brain in 2010 which revealed an incidental finding of an ACOM aneurysm. It has been monitored by Dr. Christella Noa with routine imaging scans and it has not changed since 2010. She was last advised to follow-up in 2 years regarding her ACOManeurysm. She requested a second opinion from Dr. Carles Collet regarding her ACOM aneurysm management.  MRI/MRA brain/head 04/20/2015: 1. 3 mm aneurysm projecting inferiorly from the anterior communicating artery, unchanged since the study of 2010.  IR requested by Dr. Carles Collet for management of ACOM aneurysm. Patient awake and alert sitting in chair. Complains of left-sided temporal intermittent headaches x years. States headaches occur at night. Denies fever, chills, dizziness, weakness, numbness/tingling, vision changes, hearing changes, tinnitus, or speech difficulty.   Past Medical History:  Diagnosis Date  . Allergic rhinitis   . Anterior communicating artery aneurysm   . Anxiety   . Carpal tunnel syndrome   . Cyst of kidney, acquired   . Esophageal reflux   . Fibromyalgia   . GERD (gastroesophageal reflux disease)   . HLD (hyperlipidemia)   . Hypertension   . Hypothyroidism   . Insomnia     Past Surgical History:  Procedure Laterality Date  . APPENDECTOMY  2001  . BREAST LUMPECTOMY Left 1994  . PARTIAL HYSTERECTOMY  1994    Allergies: Augmentin [amoxicillin-pot clavulanate]; Biaxin [clarithromycin]; and Shellfish  allergy  Medications: Prior to Admission medications   Medication Sig Start Date End Date Taking? Authorizing Provider  aspirin 81 MG tablet Take 81 mg by mouth daily.    [provider]  atorvastatin (LIPITOR) 40 MG tablet Take 1 tablet (40 mg total) by mouth daily. 08/15/17 04/02/18  Josue Hector, MD  Calcium Carbonate-Vitamin D (CALCIUM 500 + D PO) Take by mouth.    [provider]  cetirizine (ZYRTEC) 10 MG tablet Take 10 mg by mouth daily.    [provider]  Cholecalciferol (VITAMIN D) 2000 UNITS tablet Take 2,000 Units by mouth daily.    [provider]  diclofenac (VOLTAREN) 75 MG EC tablet Take 75 mg by mouth daily.     [provider]  Glucos-Chond-Hyal Ac-Ca Fructo (MOVE FREE JOINT HEALTH ADVANCE PO) Take daily by mouth.    [provider]  Magnesium 500 MG CAPS Take by mouth.    [provider]  propranolol ER (INDERAL LA) 80 MG 24 hr capsule Take 1 capsule (80 mg total) by mouth daily. 01/24/17   Tat, Eustace Quail, DO  ranitidine (ZANTAC) 150 MG tablet Take 150 mg by mouth 2 (two) times daily.    [provider]  SUMAtriptan (IMITREX) 100 MG tablet Take 1 tablet (100 mg total) by mouth once. May repeat in 2 hours if headache persists or recurs. Not to exceed 2 per day. 03/16/16   Tat, Eustace Quail, DO  SYNTHROID 112 MCG tablet Take 112 mcg by mouth daily. 01/18/17   [provider]  topiramate (TOPAMAX) 50 MG tablet Take 1 tablet (50 mg total) by mouth daily. Patient taking differently: Take 25 mg by mouth daily.  12/14/17   Tat, Eustace Quail, DO  zolpidem (AMBIEN) 5 MG tablet Take 5 mg at bedtime by mouth.    [provider]     Family History  Problem Relation Age of Onset  . CAD Father 16       CABG  . Cancer Mother        BREAST  . Healthy Brother   . Healthy Brother   . Diabetes Sister   . Cancer Sister        BREAST  . Healthy Son   . Healthy Daughter   . Heart disease Maternal  Grandmother   . Heart disease Maternal Grandfather   . Emphysema Paternal Grandfather     Social History   Socioeconomic History  . Marital status: Married    Spouse name: Not on file  . Number of children: Not on file  . Years of education: Not on file  . Highest education level: Not on file  Occupational History  . Not on file  Social Needs  . Financial resource strain: Not on file  . Food insecurity:    Worry: Not on file    Inability: Not on file  . Transportation needs:    Medical: Not on file    Non-medical: Not on file  Tobacco Use  . Smoking status: Former Smoker    Last attempt to quit: 09/26/2009    Years since quitting: 8.5  . Smokeless tobacco: Never Used  Substance and Sexual Activity  . Alcohol use: Yes    Alcohol/week: 0.0 oz    Comment: one drink a month  . Drug use: No  . Sexual activity: Not on file  Lifestyle  . Physical activity:    Days per week: Not on file    Minutes per session: Not on file  . Stress: Not on file  Relationships  . Social connections:    Talks on phone: Not on file    Gets together: Not on file    Attends religious service: Not on file    Active member of club or organization: Not on file    Attends meetings of clubs or organizations: Not on file    Relationship status: Not on file  Other Topics Concern  . Not on file  Social History Narrative  . Not on file     Review of Systems: A 12 point ROS discussed and pertinent positives are indicated in the HPI above.  All other systems are negative.  Review of Systems  Constitutional: Negative for chills and fever.  HENT: Negative for hearing loss and tinnitus.   Eyes: Negative for visual disturbance.  Respiratory: Negative for shortness of breath and wheezing.   Cardiovascular: Negative for chest pain and palpitations.  Neurological: Positive for headaches. Negative for dizziness, speech difficulty, weakness and numbness.  Psychiatric/Behavioral: Negative for behavioral  problems and confusion.    Vital Signs: There were no vitals taken for this visit.  Physical Exam  Constitutional: She is oriented to person, place, and time. She appears well-developed and well-nourished. No distress.  Pulmonary/Chest: Effort normal. No respiratory distress.  Neurological: She is alert and oriented to person, place, and time.  Skin: Skin is warm and dry.  Psychiatric: She has a normal mood and affect. Her behavior is normal. Judgment and thought content normal.  Nursing note and vitals reviewed.    Imaging: No results found.  Labs:  CBC: No results for input(s): WBC, HGB, HCT, PLT in the last 8760 hours.  COAGS: No results for input(s): INR, APTT in the last 8760 hours.  BMP: No results for input(s): NA, K, CL, CO2, GLUCOSE, BUN, CALCIUM, CREATININE, GFRNONAA, GFRAA in the last 8760 hours.  Invalid input(s): CMP  LIVER FUNCTION TESTS: Recent Labs    10/17/17 0808  BILITOT 0.2  AST 27  ALT 36*  ALKPHOS 59  PROT 6.2  ALBUMIN 4.3    TUMOR MARKERS: No results for input(s): AFPTM, CEA, CA199, CHROMGRNA in the last 8760 hours.  Assessment and Plan:  ACOM aneurysm. Reviewed imaging with patient. Discussed aneurysms, including the nature of aneurysms and risk of rupture. Explained that there are two management options for patient's aneurysm moving forward- either monitoring with routine image scans or with a cerebral angiogram with intervention. Both options were discussed in detail, including risks and benefits. Informed patient that if we are to move forward with intervention, she will need to take Plavix 75 mg once daily and Aspirin 81 mg once daily for 7 days prior to procedure. Patient expressed desire to move forward with intervention, but asks that she has time to think about her decision.  Plan for follow-up with diagnostic cerebral angiogram with intent to treat ASAP OR MRA head in 1 year. Patient states that she will call us when she decides  which management plan to move forward with.  All questions answered and concerns addressed. Patient conveys understanding and agrees with plan.  Thank you for this interesting consult.  I greatly enjoyed meeting Amanda Fry and look forward to participating in their care.  A copy of this report was sent to the requesting provider on this date.  Electronically Signed: Earley Abide, PA-C 04/10/2018, 8:23 AM   I spent a total of 30 Minutes in face to face in clinical consultation, greater than 50% of which was counseling/coordinating care for ACOM aneurysm.

## 2018-04-13 ENCOUNTER — Other Ambulatory Visit (HOSPITAL_COMMUNITY): Payer: Self-pay | Admitting: Interventional Radiology

## 2018-04-13 ENCOUNTER — Telehealth: Payer: Self-pay | Admitting: Student

## 2018-04-13 DIAGNOSIS — I671 Cerebral aneurysm, nonruptured: Secondary | ICD-10-CM

## 2018-04-13 NOTE — Telephone Encounter (Signed)
Received call from patient regarding management options. Patient was seen in consult with Dr. Estanislado Pandy 04/10/2018 for management of her ACOM aneurysm.  Patient states that she has decided to move forward with endovascular treatment for her aneurysm. She is calling looking for our schedulers, Caryl Pina or Anderson Malta, to inform them of this and to set up this procedure. Informed patient that our schedulers are currently out of office. I will let our schedulers know of patient's decision to move forward, and they will call her back to set up this procedure. Patient asks that we call her back at [phone].  All questions answered and concerns addressed. Patient conveys understanding and agrees with plan.  Bea Graff Bell Cai, PA-C 04/13/2018, 12:50 PM

## 2018-04-19 ENCOUNTER — Other Ambulatory Visit: Payer: Self-pay | Admitting: Radiology

## 2018-04-19 ENCOUNTER — Telehealth: Payer: Self-pay | Admitting: Radiology

## 2018-04-19 MED ORDER — DIPHENHYDRAMINE HCL 25 MG PO CAPS: 50.0000 mg | ORAL_CAPSULE | Freq: Once | ORAL | Status: AC

## 2018-04-19 MED ORDER — PREDNISONE 1 MG PO TABS: 50.0000 mg | ORAL_TABLET | Freq: Four times a day (QID) | ORAL | Status: AC

## 2018-04-19 NOTE — Progress Notes (Signed)
   Pt is scheduled for Neuro IR procedure 8/15  She is to start Plavix 75 mg po daily 04/30/18 (pt has Rx) She is to DC ASA 81 mg 8/5 and start ASA 325 mg daily  She is also allergic to Shellfish -swells Dr Estanislado Pandy wants pt to be premedicated for 8/15 procedure   Called to [facility] [place]: Prednisone 50 mg #3 Benadryl 50 mg #1  Pred 50 mg 8/14 730 pm                     8/15 130 am                     8/15 730 am Benadryl 50 mg 8/15 730 am  Pt is aware and agreeable

## 2018-05-04 NOTE — Pre-Procedure Instructions (Signed)
Amanda Fry  05/04/2018      [id number] - [address], [place] [address] Phone: [phone] Fax: [phone]    Your procedure is scheduled on Thurs., Aug. 15, 2019 from 8:30AM-12:30PM  Report to [facility] Admitting Entrance "A" at 6:30AM  Call this number if you have problems the morning of surgery:  [phone]   Remember:  Do not eat or drink after midnight on Aug. 14th    Take these medicines the morning of surgery with A SIP OF WATER: Aspirin, Clopidogrel (PLAVIX), PredniSONE (DELTASONE), Propranolol ER (INDERAL LA),  SYNTHROID, and DiphenhydrAMINE (BENADRYL)   If needed Eye drops and Sodium chloride (OCEAN) nasal spray  As of today, stop taking all Other Aspirin Products, Vitamins, Fish oils, and Herbal medications. Also stop all NSAIDS i.e. Advil, Ibuprofen, Motrin, Aleve, Anaprox, Naproxen, BC, Goody Powders, and all Supplements.    Do not wear jewelry, make-up or nail polish.  Do not wear lotions, powders, or perfumes, or deodorant.  Do not shave 48 hours prior to surgery.    Do not bring valuables to the hospital.  [facility] is not responsible for any belongings or valuables.  Contacts, dentures or bridgework may not be worn into surgery.  Leave your suitcase in the car.  After surgery it may be brought to your room.  For patients admitted to the hospital, discharge time will be determined by your treatment team.  Patients discharged the day of surgery will not be allowed to drive home.   Special instructions:   [facility]- Preparing For Surgery  Before surgery, you can play an important role. Because skin is not sterile, your skin needs to be as free of germs as possible. You can reduce the number of germs on your skin by washing with CHG (chlorahexidine gluconate) Soap before surgery.  CHG is an antiseptic cleaner which kills germs and bonds with the skin to continue killing germs  even after washing.    Oral Hygiene is also important to reduce your risk of infection.  Remember - BRUSH YOUR TEETH THE MORNING OF SURGERY WITH YOUR REGULAR TOOTHPASTE  Please do not use if you have an allergy to CHG or antibacterial soaps. If your skin becomes reddened/irritated stop using the CHG.  Do not shave (including legs and underarms) for at least 48 hours prior to first CHG shower. It is OK to shave your face.  Please follow these instructions carefully.   1. Shower the NIGHT BEFORE SURGERY and the MORNING OF SURGERY with CHG.   2. If you chose to wash your hair, wash your hair first as usual with your normal shampoo.  3. After you shampoo, rinse your hair and body thoroughly to remove the shampoo.  4. Use CHG as you would any other liquid soap. You can apply CHG directly to the skin and wash gently with a scrungie or a clean washcloth.   5. Apply the CHG Soap to your body ONLY FROM THE NECK DOWN.  Do not use on open wounds or open sores. Avoid contact with your eyes, ears, mouth and genitals (private parts). Wash Face and genitals (private parts)  with your normal soap.  6. Wash thoroughly, paying special attention to the area where your surgery will be performed.  7. Thoroughly rinse your body with warm water from the neck down.  8. DO NOT shower/wash with your normal soap after using and rinsing off the CHG Soap.  9. Pat yourself dry with a CLEAN TOWEL.  10. Wear CLEAN PAJAMAS to bed the night before surgery, wear comfortable clothes the morning of surgery  11. Place CLEAN SHEETS on your bed the night of your first shower and DO NOT SLEEP WITH PETS.  Day of Surgery:  Do not apply any deodorants/lotions.  Please wear clean clothes to the hospital/surgery center.   Remember to brush your teeth WITH YOUR REGULAR TOOTHPASTE.  Please read over the following fact sheets that you were given. Pain Booklet, Coughing and Deep Breathing and Surgical Site Infection  Prevention

## 2018-05-07 ENCOUNTER — Encounter (HOSPITAL_COMMUNITY): Payer: Self-pay | Admitting: *Deleted

## 2018-05-07 ENCOUNTER — Encounter (HOSPITAL_COMMUNITY)
Admission: RE | Admit: 2018-05-07 | Discharge: 2018-05-07 | Disposition: A | Discharge status: Home / Self Care | Payer: Medicare Other | Source: Ambulatory Visit | Attending: Interventional Radiology | Admitting: Interventional Radiology

## 2018-05-07 ENCOUNTER — Other Ambulatory Visit: Payer: Self-pay

## 2018-05-07 DIAGNOSIS — I671 Cerebral aneurysm, nonruptured: Secondary | ICD-10-CM | POA: Diagnosis not present

## 2018-05-07 DIAGNOSIS — G4700 Insomnia, unspecified: Secondary | ICD-10-CM | POA: Diagnosis not present

## 2018-05-07 DIAGNOSIS — F419 Anxiety disorder, unspecified: Secondary | ICD-10-CM | POA: Diagnosis not present

## 2018-05-07 DIAGNOSIS — Z87891 Personal history of nicotine dependence: Secondary | ICD-10-CM | POA: Diagnosis not present

## 2018-05-07 DIAGNOSIS — Z7902 Long term (current) use of antithrombotics/antiplatelets: Secondary | ICD-10-CM | POA: Diagnosis not present

## 2018-05-07 DIAGNOSIS — I6523 Occlusion and stenosis of bilateral carotid arteries: Secondary | ICD-10-CM | POA: Diagnosis not present

## 2018-05-07 DIAGNOSIS — M797 Fibromyalgia: Secondary | ICD-10-CM | POA: Diagnosis not present

## 2018-05-07 DIAGNOSIS — K219 Gastro-esophageal reflux disease without esophagitis: Secondary | ICD-10-CM | POA: Diagnosis not present

## 2018-05-07 DIAGNOSIS — E785 Hyperlipidemia, unspecified: Secondary | ICD-10-CM | POA: Diagnosis not present

## 2018-05-07 DIAGNOSIS — I10 Essential (primary) hypertension: Secondary | ICD-10-CM | POA: Diagnosis not present

## 2018-05-07 DIAGNOSIS — E039 Hypothyroidism, unspecified: Secondary | ICD-10-CM | POA: Diagnosis not present

## 2018-05-07 DIAGNOSIS — G250 Essential tremor: Secondary | ICD-10-CM | POA: Diagnosis not present

## 2018-05-07 DIAGNOSIS — Z7982 Long term (current) use of aspirin: Secondary | ICD-10-CM | POA: Diagnosis not present

## 2018-05-07 DIAGNOSIS — Z880 Allergy status to penicillin: Secondary | ICD-10-CM | POA: Diagnosis not present

## 2018-05-07 HISTORY — DX: Headache, unspecified: R51.9

## 2018-05-07 HISTORY — DX: Nausea with vomiting, unspecified: R11.2

## 2018-05-07 HISTORY — DX: Headache: R51

## 2018-05-07 HISTORY — DX: Other specified postprocedural states: Z98.890

## 2018-05-07 LAB — BASIC METABOLIC PANEL
Anion gap: 8 (ref 5–15)
BUN: 15 mg/dL (ref 8–23)
CO2: 27 mmol/L (ref 22–32)
Calcium: 9.5 mg/dL (ref 8.9–10.3)
Chloride: 108 mmol/L (ref 98–111)
Creatinine, Ser: 1.05 mg/dL — ABNORMAL HIGH (ref 0.44–1.00)
GFR calc Af Amer: >60 mL/min (ref >60)
GFR calc non Af Amer: 55 mL/min — ABNORMAL LOW (ref >60)
Glucose, Bld: 104 mg/dL — ABNORMAL HIGH (ref 70–99)
Potassium: 4.2 mmol/L (ref 3.5–5.1)
Sodium: 143 mmol/L (ref 135–145)

## 2018-05-07 LAB — CBC WITH DIFFERENTIAL/PLATELET
Abs Immature Granulocytes: 0 10*3/uL (ref 0.0–0.1)
BASOS PCT: 1 %
Basophils Absolute: 0.1 10*3/uL (ref 0.0–0.1)
EOS ABS: 0.3 10*3/uL (ref 0.0–0.7)
Eosinophils Relative: 5 %
HCT: 42.1 % (ref 36.0–46.0)
Hemoglobin: 13.3 g/dL (ref 12.0–15.0)
Immature Granulocytes: 0 %
Lymphocytes Relative: 31 %
Lymphs Abs: 1.6 10*3/uL (ref 0.7–4.0)
MCH: 30.5 pg (ref 26.0–34.0)
MCHC: 31.6 g/dL (ref 30.0–36.0)
MCV: 96.6 fL (ref 78.0–100.0)
MONO ABS: 0.4 10*3/uL (ref 0.1–1.0)
Monocytes Relative: 8 %
Neutro Abs: 2.7 10*3/uL (ref 1.7–7.7)
Neutrophils Relative %: 55 %
PLATELETS: 211 10*3/uL (ref 150–400)
RBC: 4.36 MIL/uL (ref 3.87–5.11)
RDW: 11.9 % (ref 11.5–15.5)
WBC: 5 10*3/uL (ref 4.0–10.5)

## 2018-05-07 LAB — PLATELET INHIBITION P2Y12: Platelet Function  P2Y12: 168 [PRU] — ABNORMAL LOW (ref 194–418)

## 2018-05-07 LAB — PROTIME-INR
INR: 1.06
Prothrombin Time: 13.7 seconds (ref 11.4–15.2)

## 2018-05-09 ENCOUNTER — Other Ambulatory Visit: Payer: Self-pay | Admitting: Radiology

## 2018-05-10 ENCOUNTER — Ambulatory Visit (HOSPITAL_COMMUNITY): Payer: Medicare Other | Admitting: Anesthesiology

## 2018-05-10 ENCOUNTER — Ambulatory Visit (HOSPITAL_COMMUNITY)
Admission: RE | Admit: 2018-05-10 | Discharge: 2018-05-10 | Disposition: A | Discharge status: Home / Self Care | Payer: Medicare Other | Source: Ambulatory Visit | Attending: Interventional Radiology | Admitting: Interventional Radiology

## 2018-05-10 ENCOUNTER — Encounter (HOSPITAL_COMMUNITY): Payer: Self-pay | Admitting: Anesthesiology

## 2018-05-10 ENCOUNTER — Telehealth (HOSPITAL_COMMUNITY): Payer: Self-pay

## 2018-05-10 ENCOUNTER — Encounter (HOSPITAL_COMMUNITY)
Admission: RE | Disposition: A | Discharge status: Home / Self Care | Payer: Self-pay | Source: Ambulatory Visit | Attending: Interventional Radiology

## 2018-05-10 ENCOUNTER — Other Ambulatory Visit (HOSPITAL_COMMUNITY): Payer: Self-pay | Admitting: Interventional Radiology

## 2018-05-10 ENCOUNTER — Ambulatory Visit (HOSPITAL_COMMUNITY): Payer: Medicare Other | Admitting: Physician Assistant

## 2018-05-10 ENCOUNTER — Encounter (HOSPITAL_COMMUNITY): Payer: Self-pay

## 2018-05-10 DIAGNOSIS — E785 Hyperlipidemia, unspecified: Secondary | ICD-10-CM | POA: Insufficient documentation

## 2018-05-10 DIAGNOSIS — M797 Fibromyalgia: Secondary | ICD-10-CM | POA: Diagnosis not present

## 2018-05-10 DIAGNOSIS — G4700 Insomnia, unspecified: Secondary | ICD-10-CM | POA: Diagnosis not present

## 2018-05-10 DIAGNOSIS — Z7902 Long term (current) use of antithrombotics/antiplatelets: Secondary | ICD-10-CM | POA: Insufficient documentation

## 2018-05-10 DIAGNOSIS — I671 Cerebral aneurysm, nonruptured: Secondary | ICD-10-CM | POA: Diagnosis not present

## 2018-05-10 DIAGNOSIS — E039 Hypothyroidism, unspecified: Secondary | ICD-10-CM | POA: Diagnosis not present

## 2018-05-10 DIAGNOSIS — I779 Disorder of arteries and arterioles, unspecified: Secondary | ICD-10-CM

## 2018-05-10 DIAGNOSIS — I10 Essential (primary) hypertension: Secondary | ICD-10-CM | POA: Insufficient documentation

## 2018-05-10 DIAGNOSIS — G250 Essential tremor: Secondary | ICD-10-CM | POA: Diagnosis not present

## 2018-05-10 DIAGNOSIS — Z880 Allergy status to penicillin: Secondary | ICD-10-CM | POA: Insufficient documentation

## 2018-05-10 DIAGNOSIS — I771 Stricture of artery: Secondary | ICD-10-CM

## 2018-05-10 DIAGNOSIS — I739 Peripheral vascular disease, unspecified: Secondary | ICD-10-CM

## 2018-05-10 DIAGNOSIS — K219 Gastro-esophageal reflux disease without esophagitis: Secondary | ICD-10-CM | POA: Diagnosis not present

## 2018-05-10 DIAGNOSIS — F419 Anxiety disorder, unspecified: Secondary | ICD-10-CM | POA: Insufficient documentation

## 2018-05-10 DIAGNOSIS — Z7982 Long term (current) use of aspirin: Secondary | ICD-10-CM | POA: Diagnosis not present

## 2018-05-10 DIAGNOSIS — I6522 Occlusion and stenosis of left carotid artery: Secondary | ICD-10-CM

## 2018-05-10 DIAGNOSIS — Z87891 Personal history of nicotine dependence: Secondary | ICD-10-CM | POA: Insufficient documentation

## 2018-05-10 DIAGNOSIS — I6523 Occlusion and stenosis of bilateral carotid arteries: Secondary | ICD-10-CM | POA: Insufficient documentation

## 2018-05-10 HISTORY — PX: RADIOLOGY WITH ANESTHESIA: SHX6223

## 2018-05-10 HISTORY — PX: IR ANGIO INTRA EXTRACRAN SEL COM CAROTID INNOMINATE BILAT MOD SED: IMG5360

## 2018-05-10 HISTORY — PX: IR ANGIO VERTEBRAL SEL VERTEBRAL BILAT MOD SED: IMG5369

## 2018-05-10 LAB — PLATELET INHIBITION P2Y12: Platelet Function  P2Y12: 171 [PRU] — ABNORMAL LOW (ref 194–418)

## 2018-05-10 SURGERY — IR WITH ANESTHESIA
Anesthesia: Monitor Anesthesia Care

## 2018-05-10 MED ORDER — IOPAMIDOL (ISOVUE-300) INJECTION 61%
INTRAVENOUS | Status: AC
  Filled 2018-05-10: qty 100

## 2018-05-10 MED ORDER — IOHEXOL 300 MG/ML  SOLN
300.0000 mL | Freq: Once | INTRAMUSCULAR | Status: AC | PRN
  Administered 2018-05-10: 100 mL via INTRA_ARTERIAL

## 2018-05-10 MED ORDER — NIMODIPINE 30 MG PO CAPS
ORAL_CAPSULE | ORAL | Status: AC
  Filled 2018-05-10: qty 2

## 2018-05-10 MED ORDER — HEPARIN SODIUM (PORCINE) 1000 UNIT/ML IJ SOLN
INTRAMUSCULAR | Status: DC | PRN
  Administered 2018-05-10: 1000 [IU] via INTRAVENOUS

## 2018-05-10 MED ORDER — LACTATED RINGERS IV SOLN
INTRAVENOUS | Status: DC | PRN
  Administered 2018-05-10: 07:00:00 via INTRAVENOUS

## 2018-05-10 MED ORDER — FENTANYL CITRATE (PF) 100 MCG/2ML IJ SOLN
INTRAMUSCULAR | Status: DC | PRN
  Administered 2018-05-10 (×2): 50 ug via INTRAVENOUS

## 2018-05-10 MED ORDER — DEXMEDETOMIDINE HCL IN NACL 200 MCG/50ML IV SOLN
INTRAVENOUS | Status: DC | PRN
  Administered 2018-05-10: 0.4 ug/kg/h via INTRAVENOUS

## 2018-05-10 MED ORDER — CLOPIDOGREL BISULFATE 75 MG PO TABS
ORAL_TABLET | ORAL | Status: AC
  Filled 2018-05-10: qty 1

## 2018-05-10 MED ORDER — VANCOMYCIN HCL 1000 MG IV SOLR
1000.0000 mg | INTRAVENOUS | Status: AC
  Administered 2018-05-10: 1000 mg via INTRAVENOUS
  Filled 2018-05-10: qty 1000

## 2018-05-10 MED ORDER — LIDOCAINE HCL 1 % IJ SOLN
INTRAMUSCULAR | Status: AC
  Filled 2018-05-10: qty 20

## 2018-05-10 MED ORDER — CLOPIDOGREL BISULFATE 75 MG PO TABS
75.0000 mg | ORAL_TABLET | ORAL | Status: AC
  Administered 2018-05-10: 75 mg via ORAL
  Filled 2018-05-10: qty 1

## 2018-05-10 MED ORDER — SODIUM CHLORIDE 0.9 % IV SOLN: INTRAVENOUS | Status: AC

## 2018-05-10 MED ORDER — MIDAZOLAM HCL 5 MG/5ML IJ SOLN
INTRAMUSCULAR | Status: DC | PRN
  Administered 2018-05-10: 1 mg via INTRAVENOUS

## 2018-05-10 NOTE — Transfer of Care (Signed)
Immediate Anesthesia Transfer of Care Note  Patient: Amanda Fry  Procedure(s) Performed: EMBOLIZATION (N/A )  Patient Location: Short Stay  Anesthesia Type:MAC  Level of Consciousness: awake, alert  and oriented  Airway & Oxygen Therapy: Patient Spontanous Breathing and Patient connected to nasal cannula oxygen  Post-op Assessment: Report given to RN and Post -op Vital signs reviewed and stable  Post vital signs: Reviewed and stable  Last Vitals:  Vitals Value Taken Time  BP 128/80 05/10/2018 10:41 AM  Temp 36.7 C 05/10/2018 10:42 AM  Pulse 65 05/10/2018 10:41 AM  Resp    SpO2 96 % 05/10/2018 10:41 AM    Last Pain:  Vitals:   05/10/18 0705  PainSc: 3          Complications: No apparent anesthesia complications

## 2018-05-10 NOTE — Telephone Encounter (Signed)
Called to reschedule procedure, no answer, left vm. AW 

## 2018-05-10 NOTE — Discharge Instructions (Signed)
Cerebral Angiogram, Care After °Refer to this sheet in the next few weeks. These instructions provide you with information on caring for yourself after your procedure. Your health care provider may also give you more specific instructions. Your treatment has been planned according to current medical practices, but problems sometimes occur. Call your health care provider if you have any problems or questions after your procedure. °What can I expect after the procedure? °After your procedure, it is typical to have the following: °· Bruising at the catheter insertion site that usually fades within 1-2 weeks. °· Blood collecting in the tissue (hematoma) that may be painful to the touch. It should usually decrease in size and tenderness within 1-2 weeks. °· A mild headache. ° °Follow these instructions at home: °· Take medicines only as directed by your health care provider. °· You may shower 24-48 hours after the procedure or as directed by your health care provider. Remove the bandage (dressing) and gently wash the site with plain soap and water. Pat the area dry with a clean towel. Do not rub the site, because this may cause bleeding. °· Do not take baths, swim, or use a hot tub until your health care provider approves. °· Check your insertion site every day for redness, swelling, or drainage. °· Do not apply powder or lotion to the site. °· Do not lift over 10 lb (4.5 kg) for 5 days after your procedure or as directed by your health care provider. °· Ask your health care provider when it is okay to: °? Return to work or school. °? Resume usual physical activities or sports. °? Resume sexual activity. °· Do not drive home if you are discharged the same day as the procedure. Have someone else drive you. °· You may drive 24 hours after the procedure unless otherwise instructed by your health care provider. °· Do not operate machinery or power tools for 24 hours after the procedure or as directed by your health care  provider. °· If your procedure was done as an outpatient procedure, which means that you went home the same day as your procedure, a responsible adult should be with you for the first 24 hours after you arrive home. °· Keep all follow-up visits as directed by your health care provider. This is important. °Contact a health care provider if: °· You have a fever. °· You have chills. °· You have increased bleeding from the catheter insertion site. Hold pressure on the site. °Get help right away if: °· You have vision changes or loss of vision. °· You have numbness or weakness on one side of your body. °· You have difficulty talking, or you have slurred speech or cannot speak (aphasia). °· You feel confused or have difficulty remembering. °· You have unusual pain at the catheter insertion site. °· You have redness, warmth, or swelling at the catheter insertion site. °· You have drainage (other than a small amount of blood on the dressing) from the catheter insertion site. °· The catheter insertion site is bleeding, and the bleeding does not stop after 30 minutes of holding steady pressure on the site. °These symptoms may represent a serious problem that is an emergency. Do not wait to see if the symptoms will go away. Get medical help right away. Call your local emergency services (911 in [place] Do not drive yourself to the hospital. °This information is not intended to replace advice given to you by your health care provider. Make sure you discuss any questions   you have with your health care provider. °Document Released: 01/27/2014 Document Revised: 02/18/2016 Document Reviewed: 09/25/2013 °Elsevier Interactive Patient Education © 2017 [employer] °Moderate Conscious Sedation, Adult, Care After °These instructions provide you with information about caring for yourself after your procedure. Your health care provider may also give you more specific instructions. Your treatment has been planned according to current  medical practices, but problems sometimes occur. Call your health care provider if you have any problems or questions after your procedure. °What can I expect after the procedure? °After your procedure, it is common: °· To feel sleepy for several hours. °· To feel clumsy and have poor balance for several hours. °· To have poor judgment for several hours. °· To vomit if you eat too soon. ° °Follow these instructions at home: °For at least 24 hours after the procedure: ° °· Do not: °? Participate in activities where you could fall or become injured. °? Drive. °? Use heavy machinery. °? Drink alcohol. °? Take sleeping pills or medicines that cause drowsiness. °? Make important decisions or sign legal documents. °? Take care of children on your own. °· Rest. °Eating and drinking °· Follow the diet recommended by your health care provider. °· If you vomit: °? Drink water, juice, or soup when you can drink without vomiting. °? Make sure you have little or no nausea before eating solid foods. °General instructions °· Have a responsible adult stay with you until you are awake and alert. °· Take over-the-counter and prescription medicines only as told by your health care provider. °· If you smoke, do not smoke without supervision. °· Keep all follow-up visits as told by your health care provider. This is important. °Contact a health care provider if: °· You keep feeling nauseous or you keep vomiting. °· You feel light-headed. °· You develop a rash. °· You have a fever. °Get help right away if: °· You have trouble breathing. °This information is not intended to replace advice given to you by your health care provider. Make sure you discuss any questions you have with your health care provider. °Document Released: 07/03/2013 Document Revised: 02/15/2016 Document Reviewed: 01/02/2016 °Elsevier Interactive Patient Education © 2018 Elsevier Inc. ° °

## 2018-05-10 NOTE — H&P (Addendum)
Chief Complaint: Patient was seen in consultation today for cerebral arteriogram with possible anterior communicating artery aneurysm embolization at the request of Dr Alfonso Patten Tat   Supervising Physician: Luanne Bras  Patient Status: [facility] - Out-pt  History of Present Illness: Amanda Fry is a 66 y.o. 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 3 mm aneurysm projecting inferiorly from the anterior communicating artery, unchanged since the study of 2010. Referred to Dr Estanislado Pandy for evaluation and possible management  Scheduled now for cerebral arteriogram with possible embolization of ACOM aneurysm  Pt has been premedicated for Dye Allergy   Past Medical History:  Diagnosis Date  . Allergic rhinitis   . Anterior communicating artery aneurysm   . Anxiety   . Carpal tunnel syndrome   . Cyst of kidney, acquired   . Esophageal reflux   . Fibromyalgia   . GERD (gastroesophageal reflux disease)   . Headache   . HLD (hyperlipidemia)   . Hypertension   . Hypothyroidism   . Insomnia   . PONV (postoperative nausea and vomiting)     Past Surgical History:  Procedure Laterality Date  . APPENDECTOMY  2001  . BREAST LUMPECTOMY Left 1994  . CESAREAN SECTION    . PARTIAL HYSTERECTOMY  1994  . TONSILLECTOMY      Allergies: Clams [shellfish allergy]; Augmentin [amoxicillin-pot clavulanate]; and Biaxin [clarithromycin]  Medications: Prior to Admission medications   Medication Sig Start Date End Date Taking? Authorizing Provider  ARTIFICIAL TEAR SOLUTION OP Place 1 drop into both eyes daily as needed (dry eyes).    [provider]  aspirin EC 325 MG tablet Take 325 mg by mouth at bedtime.    [provider]  atorvastatin (LIPITOR) 40 MG  tablet Take 1 tablet (40 mg total) by mouth daily. Patient taking differently: Take 40 mg by mouth at bedtime.  08/15/17 04/30/18  Josue Hector, MD  Calcium Carb-Cholecalciferol (CALCIUM 600+D) 600-800 MG-UNIT TABS Take 1 tablet by mouth at bedtime.    [provider]  cetirizine (ZYRTEC) 10 MG tablet Take 10 mg by mouth at bedtime.     [provider]  cholecalciferol (VITAMIN D) 1000 units tablet Take 1,000 Units by mouth 2 (two) times daily.    [provider]  clopidogrel (PLAVIX) 75 MG tablet Take 75 mg by mouth at bedtime.    [provider]  diphenhydrAMINE (BENADRYL) 25 mg capsule TAKE 2 CAPS BY MOUTH ON AUG 15 @7 :30AM 04/19/18   [provider]  Glucos-Chond-Hyal Ac-Ca Fructo (MOVE FREE JOINT HEALTH ADVANCE PO) Take 1 tablet by mouth at bedtime.     [provider]  Magnesium 500 MG CAPS Take 500 mg by mouth at bedtime.     [provider]  Melatonin 5 MG CAPS Take 5 mg by mouth at bedtime.    [provider]  predniSONE (DELTASONE) 50 MG tablet TAKE 1 TABLET BY MOUTH ON AUG 14 @7 :30PM, 1 TAB @ 1:30AM AND 1 TAB ON AUG 15 AT 7:30AM 04/19/18   [provider]  propranolol ER (INDERAL LA) 80 MG 24 hr capsule Take 1 capsule (80 mg total) by mouth daily. 01/24/17   Tat, Eustace Quail, DO  ranitidine (ZANTAC) 150 MG tablet Take 150 mg by mouth at bedtime.     [provider]  sodium chloride (OCEAN) 0.65 % SOLN nasal spray Place 1 spray into both nostrils as needed for congestion.    [provider]  SUMAtriptan (IMITREX) 100 MG tablet Take 1 tablet (100 mg total) by mouth once. May repeat in 2 hours if headache persists or recurs. Not to exceed 2 per day. 03/16/16   Tat, Eustace Quail, DO  SYNTHROID 112 MCG tablet Take 112 mcg by mouth daily. 01/18/17   [provider]  topiramate (TOPAMAX) 50 MG tablet Take 1 tablet (50 mg total) by mouth daily. Patient not taking: Reported on 04/30/2018 12/14/17   Tat,  Eustace Quail, DO  zolpidem (AMBIEN) 5 MG tablet Take 5 mg at bedtime by mouth.    [provider]     Family History  Problem Relation Age of Onset  . CAD Father 62       CABG  . Cancer Mother        BREAST  . Healthy Brother   . Healthy Brother   . Diabetes Sister   . Cancer Sister        BREAST  . Healthy Son   . Healthy Daughter   . Heart disease Maternal Grandmother   . Heart disease Maternal Grandfather   . Emphysema Paternal Grandfather     Social History   Socioeconomic History  . Marital status: Married    Spouse name: Not on file  . Number of children: Not on file  . Years of education: Not on file  . Highest education level: Not on file  Occupational History  . Not on file  Social Needs  . Financial resource strain: Not on file  . Food insecurity:    Worry: Not on file    Inability: Not on file  . Transportation needs:    Medical: Not on file    Non-medical: Not on file  Tobacco Use  . Smoking status: Former Smoker    Packs/day: 0.50    Years: 10.00    Pack years: 5.00    Types: Cigarettes    Last attempt to quit: 09/26/2009    Years since quitting: 8.6  . Smokeless tobacco: Never Used  Substance and Sexual Activity  . Alcohol use: Yes    Alcohol/week: 7.0 standard drinks    Types: 7 Glasses of wine per week  . Drug use: No  . Sexual activity: Not on file  Lifestyle  . Physical activity:    Days per week: Not on file    Minutes per session: Not on file  . Stress: Not on file  Relationships  . Social connections:    Talks on phone: Not on file    Gets together: Not on file    Attends religious service: Not on file    Active member of club or organization: Not on file    Attends meetings of clubs or organizations: Not on file    Relationship status: Not on file  Other Topics Concern  . Not on file  Social History Narrative  . Not on file     Review of Systems: A 12 point ROS discussed and pertinent positives are indicated in the  HPI above.  All other systems are negative.  Review of Systems  Constitutional: Negative for activity change, appetite change, fatigue and fever.  HENT: Negative for tinnitus.   Eyes: Negative for visual disturbance.  Respiratory: Negative for cough and shortness of breath.   Cardiovascular: Negative for chest pain.  Gastrointestinal: Negative for abdominal pain  and nausea.  Musculoskeletal: Positive for myalgias.  Neurological: Negative for dizziness, tremors, seizures, syncope, facial asymmetry, speech difficulty, weakness, light-headedness, numbness and headaches.  Psychiatric/Behavioral: Negative for behavioral problems and confusion.    Vital Signs: BP (!) 143/96   Pulse 93   Temp 98.2 F (36.8 C)   Resp 20   SpO2 96%   Physical Exam  Constitutional: She is oriented to person, place, and time. She appears well-nourished.  HENT:  Head: Atraumatic.  Eyes: EOM are normal.  Neck: Neck supple.  Cardiovascular: Normal rate, regular rhythm and normal heart sounds.  No murmur heard. Pulmonary/Chest: Effort normal and breath sounds normal. She has no wheezes.  Abdominal: Soft. Bowel sounds are normal. There is no tenderness.  Musculoskeletal: Normal range of motion. She exhibits no edema.  Neurological: She is alert and oriented to person, place, and time.  Skin: Skin is warm and dry.  Psychiatric: She has a normal mood and affect. Her behavior is normal. Judgment and thought content normal.  Nursing note and vitals reviewed.   Imaging: No results found.  Labs:  CBC: Recent Labs    05/07/18 1100  WBC 5.0  HGB 13.3  HCT 42.1  PLT 211    COAGS: Recent Labs    05/07/18 1100  INR 1.06    BMP: Recent Labs    05/07/18 1100  NA 143  K 4.2  CL 108  CO2 27  GLUCOSE 104*  BUN 15  CALCIUM 9.5  CREATININE 1.05*  GFRNONAA 55*  GFRAA >60    LIVER FUNCTION TESTS: Recent Labs    10/17/17 0808  BILITOT 0.2  AST 27  ALT 36*  ALKPHOS 59  PROT 6.2    ALBUMIN 4.3    TUMOR MARKERS: No results for input(s): AFPTM, CEA, CA199, CHROMGRNA in the last 8760 hours.  Assessment and Plan:  Asymptomatic Anterior communicating artery aneurysm Scheduled for cerebral arteriogram with possible embolization (pre medicated with 13 hr prep for dye allergy) Risks and benefits of cerebral angiogram with intervention were discussed with the patient including, but not limited to bleeding, infection, vascular injury, contrast induced renal failure, stroke or even death.  This interventional procedure involves the use of X-rays and because of the nature of the planned procedure, it is possible that we will have prolonged use of X-ray fluoroscopy.  Potential radiation risks to you include (but are not limited to) the following: - A slightly elevated risk for cancer  several years later in life. This risk is typically less than 0.5% percent. This risk is low in comparison to the normal incidence of human cancer, which is 33% for women and 50% for men according to the [employer]. - Radiation induced injury can include skin redness, resembling a rash, tissue breakdown / ulcers and hair loss (which can be temporary or permanent).   The likelihood of either of these occurring depends on the difficulty of the procedure and whether you are sensitive to radiation due to previous procedures, disease, or genetic conditions.   IF your procedure requires a prolonged use of radiation, you will be notified and given written instructions for further action.  It is your responsibility to monitor the irradiated area for the 2 weeks following the procedure and to notify your physician if you are concerned that you have suffered a radiation induced injury.    All of the patient's questions were answered, patient is agreeable to proceed.  Consent signed and in chart.  Thank you for this  interesting consult.  I greatly enjoyed meeting Amanda Fry and look  forward to participating in their care.  A copy of this report was sent to the requesting provider on this date.  Electronically Signed: Lavonia Drafts, PA-C 05/10/2018, 7:27 AM   I spent a total of  30 Minutes   in face to face in clinical consultation, greater than 50% of which was counseling/coordinating care for cerebral arteriogram with possible ACOM aneurysm embolization

## 2018-05-10 NOTE — Anesthesia Preprocedure Evaluation (Addendum)
Anesthesia Evaluation  Patient identified by MRN, date of birth, ID band Patient awake    Reviewed: Allergy & Precautions, NPO status , Patient's Chart, lab work & pertinent test results, reviewed documented beta blocker date and time   History of Anesthesia Complications (+) PONV and history of anesthetic complications  Airway Mallampati: III  TM Distance: >3 FB Neck ROM: Full    Dental  (+) Chipped,    Pulmonary former smoker,    Pulmonary exam normal breath sounds clear to auscultation       Cardiovascular hypertension, Pt. on home beta blockers Normal cardiovascular exam Rhythm:Regular Rate:Normal  ECG: NSR, rate 76  Nuclear stress EF: 68%. The left ventricular ejection fraction is hyperdynamic (>65%). The study is normal. This is a low risk study.       Neuro/Psych  Headaches, Anxiety 3 mm aneurysm projecting inferiorly from the anterior communicating artery, unchanged since the study of 2010.    GI/Hepatic Neg liver ROS, GERD  Medicated and Controlled,  Endo/Other  Hypothyroidism   Renal/GU negative Renal ROS     Musculoskeletal  (+) Fibromyalgia -  Abdominal   Peds  Hematology HLD   Anesthesia Other Findings anterior communicating artery aneurysm   Reproductive/Obstetrics                            Anesthesia Physical Anesthesia Plan  ASA: III  Anesthesia Plan: General and MAC   Post-op Pain Management:    Induction: Intravenous  PONV Risk Score and Plan: 4 or greater and Ondansetron, Dexamethasone and Treatment may vary due to age or medical condition  Airway Management Planned: Oral ETT  Additional Equipment: Arterial line  Intra-op Plan:   Post-operative Plan: Extubation in OR  Informed Consent: I have reviewed the patients History and Physical, chart, labs and discussed the procedure including the risks, benefits and alternatives for the proposed anesthesia  with the patient or authorized representative who has indicated his/her understanding and acceptance.   Dental advisory given  Plan Discussed with: CRNA  Anesthesia Plan Comments:        Anesthesia Quick Evaluation

## 2018-05-10 NOTE — Sedation Documentation (Signed)
Right groin and pulses checked with Page, RN - short stay.

## 2018-05-10 NOTE — Discharge Instructions (Signed)
Angiogram, Care After °This sheet gives you information about how to care for yourself after your procedure. Your doctor may also give you more specific instructions. If you have problems or questions, contact your doctor. °Follow these instructions at home: °Insertion site care °· Follow instructions from your doctor about how to take care of your long, thin tube (catheter) insertion area. Make sure you: °? Wash your hands with soap and water before you change your bandage (dressing). If you cannot use soap and water, use hand sanitizer. °? Change your bandage as told by your doctor. °? Leave stitches (sutures), skin glue, or skin tape (adhesive) strips in place. They may need to stay in place for 2 weeks or longer. If tape strips get loose and curl up, you may trim the loose edges. Do not remove tape strips completely unless your doctor says it is okay. °· Do not take baths, swim, or use a hot tub until your doctor says it is okay. °· You may shower 24-48 hours after the procedure or as told by your doctor. °? Gently wash the area with plain soap and water. °? Pat the area dry with a clean towel. °? Do not rub the area. This may cause bleeding. °· Do not apply powder or lotion to the area. Keep the area clean and dry. °· Check your insertion area every day for signs of infection. Check for: °? More redness, swelling, or pain. °? Fluid or blood. °? Warmth. °? Pus or a bad smell. °Activity °· Rest as told by your doctor, usually for 1-2 days. °· Do not lift anything that is heavier than 10 lbs. (4.5 kg) or as told by your doctor. °· Do not drive for 24 hours if you were given a medicine to help you relax (sedative). °· Do not drive or use heavy machinery while taking prescription pain medicine. °General instructions °· Go back to your normal activities as told by your doctor, usually in about a week. Ask your doctor what activities are safe for you. °· If the insertion area starts to bleed, lie flat and put pressure  on the area. If the bleeding does not stop, get help right away. This is an emergency. °· Drink enough fluid to keep your pee (urine) clear or pale yellow. °· Take over-the-counter and prescription medicines only as told by your doctor. °· Keep all follow-up visits as told by your doctor. This is important. °Contact a doctor if: °· You have a fever. °· You have chills. °· You have more redness, swelling, or pain around your insertion area. °· You have fluid or blood coming from your insertion area. °· The insertion area feels warm to the touch. °· You have pus or a bad smell coming from your insertion area. °· You have more bruising around the insertion area. °· Blood collects in the tissue around the insertion area (hematoma) that may be painful to the touch. °Get help right away if: °· You have a lot of pain in the insertion area. °· The insertion area swells very fast. °· The insertion area is bleeding, and the bleeding does not stop after holding steady pressure on the area. °· The area near or just beyond the insertion area becomes pale, cool, tingly, or numb. °These symptoms may be an emergency. Do not wait to see if the symptoms will go away. Get medical help right away. Call your local emergency services (911 in the [place] Do not drive yourself to the hospital. °Summary °·   After the procedure, it is common to have bruising and tenderness at the long, thin tube insertion area. °· After the procedure, it is important to rest and drink plenty of fluids. °· Do not take baths, swim, or use a hot tub until your doctor says it is okay to do so. You may shower 24-48 hours after the procedure or as told by your doctor. °· If the insertion area starts to bleed, lie flat and put pressure on the area. If the bleeding does not stop, get help right away. This is an emergency. °This information is not intended to replace advice given to you by your health care provider. Make sure you discuss any questions you have with  your health care provider. °Document Released: 12/09/2008 Document Revised: 09/06/2016 Document Reviewed: 09/06/2016 °Elsevier Interactive Patient Education © 2017 [employer] °Moderate Conscious Sedation, Adult, Care After °These instructions provide you with information about caring for yourself after your procedure. Your health care provider may also give you more specific instructions. Your treatment has been planned according to current medical practices, but problems sometimes occur. Call your health care provider if you have any problems or questions after your procedure. °What can I expect after the procedure? °After your procedure, it is common: °· To feel sleepy for several hours. °· To feel clumsy and have poor balance for several hours. °· To have poor judgment for several hours. °· To vomit if you eat too soon. ° °Follow these instructions at home: °For at least 24 hours after the procedure: ° °· Do not: °? Participate in activities where you could fall or become injured. °? Drive. °? Use heavy machinery. °? Drink alcohol. °? Take sleeping pills or medicines that cause drowsiness. °? Make important decisions or sign legal documents. °? Take care of children on your own. °· Rest. °Eating and drinking °· Follow the diet recommended by your health care provider. °· If you vomit: °? Drink water, juice, or soup when you can drink without vomiting. °? Make sure you have little or no nausea before eating solid foods. °General instructions °· Have a responsible adult stay with you until you are awake and alert. °· Take over-the-counter and prescription medicines only as told by your health care provider. °· If you smoke, do not smoke without supervision. °· Keep all follow-up visits as told by your health care provider. This is important. °Contact a health care provider if: °· You keep feeling nauseous or you keep vomiting. °· You feel light-headed. °· You develop a rash. °· You have a fever. °Get help right  away if: °· You have trouble breathing. °This information is not intended to replace advice given to you by your health care provider. Make sure you discuss any questions you have with your health care provider. °Document Released: 07/03/2013 Document Revised: 02/15/2016 Document Reviewed: 01/02/2016 °Elsevier Interactive Patient Education © 2018 [employer] ° °

## 2018-05-10 NOTE — Procedures (Signed)
4 vessel cerebral arteriogram RT CFA approach. Findings. 1. 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 3.Approcx 30 % stenosis of RT ICA prox

## 2018-05-10 NOTE — H&P (Deleted)
  The note originally documented on this encounter has been moved the the encounter in which it belongs.  

## 2018-05-10 NOTE — Sedation Documentation (Signed)
Bedrest to start at 1020.

## 2018-05-11 NOTE — Anesthesia Postprocedure Evaluation (Signed)
Anesthesia Post Note  Patient: Amanda Fry  Procedure(s) Performed: EMBOLIZATION (N/A )     Patient location during evaluation: PACU Anesthesia Type: MAC Level of consciousness: awake and alert Pain management: pain level controlled Vital Signs Assessment: post-procedure vital signs reviewed and stable Respiratory status: spontaneous breathing, nonlabored ventilation, respiratory function stable and patient connected to nasal cannula oxygen Cardiovascular status: stable and blood pressure returned to baseline Postop Assessment: no apparent nausea or vomiting Anesthetic complications: no    Last Vitals: There were no vitals filed for this visit.  Last Pain:  Vitals:   05/10/18 0705  PainSc: 3                  Desyre Calma P Gust Eugene

## 2018-05-14 ENCOUNTER — Encounter (HOSPITAL_COMMUNITY): Payer: Self-pay | Admitting: Interventional Radiology

## 2018-05-14 ENCOUNTER — Other Ambulatory Visit: Payer: Self-pay | Admitting: Radiology

## 2018-05-14 ENCOUNTER — Other Ambulatory Visit: Payer: Self-pay

## 2018-05-14 NOTE — Progress Notes (Signed)
Spoke with pt for pre-op call. Pt was just seen here last week for a procedure. States nothing has changed with her allergies, medications, medical and surgical history. Pt states she has taken her Plavix this afternoon and will take one in the AM per previous instructions from Dr. Arlean Hopping office.

## 2018-05-15 ENCOUNTER — Inpatient Hospital Stay (HOSPITAL_COMMUNITY)
Admission: RE | Admit: 2018-05-15 | Discharge: 2018-05-16 | DRG: 983 | Disposition: A | Discharge status: Home / Self Care | Payer: Medicare Other | Source: Ambulatory Visit | Attending: Interventional Radiology | Admitting: Interventional Radiology

## 2018-05-15 ENCOUNTER — Ambulatory Visit (HOSPITAL_COMMUNITY): Admission: RE | Admit: 2018-05-15 | Payer: Medicare Other | Source: Ambulatory Visit

## 2018-05-15 ENCOUNTER — Inpatient Hospital Stay (HOSPITAL_COMMUNITY): Payer: Medicare Other | Admitting: Certified Registered Nurse Anesthetist

## 2018-05-15 ENCOUNTER — Encounter (HOSPITAL_COMMUNITY)
Admission: RE | Disposition: A | Discharge status: Home / Self Care | Payer: Self-pay | Source: Ambulatory Visit | Attending: Interventional Radiology

## 2018-05-15 ENCOUNTER — Encounter (HOSPITAL_COMMUNITY): Payer: Self-pay | Admitting: Urology

## 2018-05-15 ENCOUNTER — Other Ambulatory Visit: Payer: Self-pay

## 2018-05-15 ENCOUNTER — Ambulatory Visit (HOSPITAL_COMMUNITY)
Admission: RE | Admit: 2018-05-15 | Discharge: 2018-05-15 | Disposition: A | Discharge status: Home / Self Care | Payer: Medicare Other | Source: Ambulatory Visit | Attending: Interventional Radiology | Admitting: Interventional Radiology

## 2018-05-15 DIAGNOSIS — K219 Gastro-esophageal reflux disease without esophagitis: Secondary | ICD-10-CM | POA: Diagnosis not present

## 2018-05-15 DIAGNOSIS — G5600 Carpal tunnel syndrome, unspecified upper limb: Secondary | ICD-10-CM | POA: Diagnosis present

## 2018-05-15 DIAGNOSIS — E785 Hyperlipidemia, unspecified: Secondary | ICD-10-CM | POA: Diagnosis not present

## 2018-05-15 DIAGNOSIS — Z79899 Other long term (current) drug therapy: Secondary | ICD-10-CM | POA: Diagnosis not present

## 2018-05-15 DIAGNOSIS — Z881 Allergy status to other antibiotic agents status: Secondary | ICD-10-CM

## 2018-05-15 DIAGNOSIS — G4700 Insomnia, unspecified: Secondary | ICD-10-CM | POA: Diagnosis not present

## 2018-05-15 DIAGNOSIS — Z9049 Acquired absence of other specified parts of digestive tract: Secondary | ICD-10-CM | POA: Diagnosis not present

## 2018-05-15 DIAGNOSIS — Z833 Family history of diabetes mellitus: Secondary | ICD-10-CM | POA: Diagnosis not present

## 2018-05-15 DIAGNOSIS — Z91041 Radiographic dye allergy status: Secondary | ICD-10-CM | POA: Diagnosis not present

## 2018-05-15 DIAGNOSIS — F419 Anxiety disorder, unspecified: Secondary | ICD-10-CM

## 2018-05-15 DIAGNOSIS — E039 Hypothyroidism, unspecified: Secondary | ICD-10-CM

## 2018-05-15 DIAGNOSIS — M797 Fibromyalgia: Secondary | ICD-10-CM | POA: Diagnosis present

## 2018-05-15 DIAGNOSIS — Z8249 Family history of ischemic heart disease and other diseases of the circulatory system: Secondary | ICD-10-CM | POA: Diagnosis not present

## 2018-05-15 DIAGNOSIS — Z9089 Acquired absence of other organs: Secondary | ICD-10-CM | POA: Diagnosis not present

## 2018-05-15 DIAGNOSIS — Z803 Family history of malignant neoplasm of breast: Secondary | ICD-10-CM | POA: Diagnosis not present

## 2018-05-15 DIAGNOSIS — I6529 Occlusion and stenosis of unspecified carotid artery: Secondary | ICD-10-CM | POA: Diagnosis present

## 2018-05-15 DIAGNOSIS — Z79890 Hormone replacement therapy (postmenopausal): Secondary | ICD-10-CM | POA: Diagnosis not present

## 2018-05-15 DIAGNOSIS — I10 Essential (primary) hypertension: Secondary | ICD-10-CM

## 2018-05-15 DIAGNOSIS — I671 Cerebral aneurysm, nonruptured: Secondary | ICD-10-CM | POA: Diagnosis not present

## 2018-05-15 DIAGNOSIS — I739 Peripheral vascular disease, unspecified: Secondary | ICD-10-CM | POA: Diagnosis not present

## 2018-05-15 DIAGNOSIS — Z7982 Long term (current) use of aspirin: Secondary | ICD-10-CM | POA: Diagnosis not present

## 2018-05-15 DIAGNOSIS — Z825 Family history of asthma and other chronic lower respiratory diseases: Secondary | ICD-10-CM

## 2018-05-15 DIAGNOSIS — Z91013 Allergy to seafood: Secondary | ICD-10-CM

## 2018-05-15 DIAGNOSIS — I771 Stricture of artery: Secondary | ICD-10-CM

## 2018-05-15 DIAGNOSIS — I6523 Occlusion and stenosis of bilateral carotid arteries: Secondary | ICD-10-CM | POA: Diagnosis not present

## 2018-05-15 DIAGNOSIS — Z87891 Personal history of nicotine dependence: Secondary | ICD-10-CM

## 2018-05-15 DIAGNOSIS — I6522 Occlusion and stenosis of left carotid artery: Secondary | ICD-10-CM

## 2018-05-15 DIAGNOSIS — Z90710 Acquired absence of both cervix and uterus: Secondary | ICD-10-CM | POA: Diagnosis not present

## 2018-05-15 HISTORY — PX: RADIOLOGY WITH ANESTHESIA: SHX6223

## 2018-05-15 HISTORY — PX: IR INTRAVSC STENT CERV CAROTID W/EMB-PROT MOD SED INCL ANGIO: IMG2303

## 2018-05-15 LAB — CBC
HEMATOCRIT: 42.1 % (ref 36.0–46.0)
HEMOGLOBIN: 13.8 g/dL (ref 12.0–15.0)
MCH: 31.3 pg (ref 26.0–34.0)
MCHC: 32.8 g/dL (ref 30.0–36.0)
MCV: 95.5 fL (ref 78.0–100.0)
Platelets: 211 10*3/uL (ref 150–400)
RBC: 4.41 MIL/uL (ref 3.87–5.11)
RDW: 11.8 % (ref 11.5–15.5)
WBC: 6 10*3/uL (ref 4.0–10.5)

## 2018-05-15 LAB — BASIC METABOLIC PANEL
ANION GAP: 8 (ref 5–15)
BUN: 22 mg/dL (ref 8–23)
CALCIUM: 9.3 mg/dL (ref 8.9–10.3)
CHLORIDE: 111 mmol/L (ref 98–111)
CO2: 20 mmol/L — AB (ref 22–32)
Creatinine, Ser: 1.24 mg/dL — ABNORMAL HIGH (ref 0.44–1.00)
GFR calc non Af Amer: 45 mL/min — ABNORMAL LOW (ref >60)
GFR, EST AFRICAN AMERICAN: 52 mL/min — AB (ref >60)
GLUCOSE: 147 mg/dL — AB (ref 70–99)
POTASSIUM: 4.1 mmol/L (ref 3.5–5.1)
Sodium: 139 mmol/L (ref 135–145)

## 2018-05-15 LAB — MRSA PCR SCREENING: MRSA BY PCR: NEGATIVE

## 2018-05-15 LAB — POCT ACTIVATED CLOTTING TIME
ACTIVATED CLOTTING TIME: 186 s
ACTIVATED CLOTTING TIME: 202 s

## 2018-05-15 LAB — PROTIME-INR
INR: 1.05
PROTHROMBIN TIME: 13.6 s (ref 11.4–15.2)

## 2018-05-15 LAB — HEPARIN LEVEL (UNFRACTIONATED): Heparin Unfractionated: 0.17 IU/mL — ABNORMAL LOW (ref 0.30–0.70)

## 2018-05-15 LAB — PLATELET INHIBITION P2Y12: Platelet Function  P2Y12: 113 [PRU] — ABNORMAL LOW (ref 194–418)

## 2018-05-15 SURGERY — IR WITH ANESTHESIA
Anesthesia: Monitor Anesthesia Care

## 2018-05-15 MED ORDER — GLYCOPYRROLATE 0.2 MG/ML IJ SOLN
INTRAMUSCULAR | Status: DC | PRN
  Administered 2018-05-15: 0.2 mg via INTRAVENOUS

## 2018-05-15 MED ORDER — VANCOMYCIN HCL IN DEXTROSE 1-5 GM/200ML-% IV SOLN
INTRAVENOUS | Status: AC
  Filled 2018-05-15: qty 200

## 2018-05-15 MED ORDER — HEPARIN (PORCINE) IN NACL 100-0.45 UNIT/ML-% IJ SOLN
INTRAMUSCULAR | Status: AC
  Filled 2018-05-15: qty 250

## 2018-05-15 MED ORDER — MIDAZOLAM HCL 2 MG/2ML IJ SOLN
INTRAMUSCULAR | Status: AC
  Filled 2018-05-15: qty 2

## 2018-05-15 MED ORDER — LORATADINE 10 MG PO TABS: 10.0000 mg | ORAL_TABLET | Freq: Every day | ORAL | Status: DC

## 2018-05-15 MED ORDER — LORATADINE 10 MG PO TABS
10.0000 mg | ORAL_TABLET | Freq: Every day | ORAL | Status: DC
  Filled 2018-05-15: qty 1

## 2018-05-15 MED ORDER — SUMATRIPTAN SUCCINATE 100 MG PO TABS: 100.0000 mg | ORAL_TABLET | ORAL | Status: DC | PRN

## 2018-05-15 MED ORDER — ACETAMINOPHEN 650 MG RE SUPP: 650.0000 mg | RECTAL | Status: DC | PRN

## 2018-05-15 MED ORDER — TOPIRAMATE 25 MG PO TABS: 50.0000 mg | ORAL_TABLET | Freq: Every day | ORAL | Status: DC

## 2018-05-15 MED ORDER — FENTANYL CITRATE (PF) 100 MCG/2ML IJ SOLN
INTRAMUSCULAR | Status: AC
  Filled 2018-05-15: qty 2

## 2018-05-15 MED ORDER — LIDOCAINE HCL 1 % IJ SOLN
INTRAMUSCULAR | Status: AC
  Filled 2018-05-15: qty 20

## 2018-05-15 MED ORDER — EPTIFIBATIDE 20 MG/10ML IV SOLN
INTRAVENOUS | Status: AC
  Filled 2018-05-15: qty 10

## 2018-05-15 MED ORDER — LEVOTHYROXINE SODIUM 112 MCG PO TABS
112.0000 ug | ORAL_TABLET | Freq: Every day | ORAL | Status: DC
  Administered 2018-05-16: 112 ug via ORAL
  Filled 2018-05-15: qty 1

## 2018-05-15 MED ORDER — SALINE SPRAY 0.65 % NA SOLN: 1.0000 | NASAL | Status: DC | PRN

## 2018-05-15 MED ORDER — SUMATRIPTAN SUCCINATE 100 MG PO TABS: 100.0000 mg | ORAL_TABLET | Freq: Once | ORAL | Status: DC

## 2018-05-15 MED ORDER — MIDAZOLAM HCL 2 MG/2ML IJ SOLN
INTRAMUSCULAR | Status: DC | PRN
  Administered 2018-05-15: 1 mg via INTRAVENOUS
  Administered 2018-05-15: 2 mg via INTRAVENOUS
  Administered 2018-05-15: 1 mg via INTRAVENOUS

## 2018-05-15 MED ORDER — ASPIRIN 325 MG PO TABS
325.0000 mg | ORAL_TABLET | Freq: Every day | ORAL | Status: DC
  Administered 2018-05-16: 325 mg via ORAL
  Filled 2018-05-15: qty 1

## 2018-05-15 MED ORDER — ONDANSETRON HCL 4 MG/2ML IJ SOLN: 4.0000 mg | Freq: Once | INTRAMUSCULAR | Status: DC | PRN

## 2018-05-15 MED ORDER — SODIUM CHLORIDE 0.9 % IV SOLN: INTRAVENOUS | Status: DC

## 2018-05-15 MED ORDER — CLOPIDOGREL BISULFATE 75 MG PO TABS
75.0000 mg | ORAL_TABLET | Freq: Every day | ORAL | Status: DC
  Administered 2018-05-16: 75 mg via ORAL
  Filled 2018-05-15: qty 1

## 2018-05-15 MED ORDER — CLOPIDOGREL BISULFATE 75 MG PO TABS: 75.0000 mg | ORAL_TABLET | Freq: Every day | ORAL | Status: DC

## 2018-05-15 MED ORDER — ZOLPIDEM TARTRATE 5 MG PO TABS: 5.0000 mg | ORAL_TABLET | Freq: Every day | ORAL | Status: DC

## 2018-05-15 MED ORDER — LEVOTHYROXINE SODIUM 112 MCG PO TABS: 112.0000 ug | ORAL_TABLET | Freq: Every day | ORAL | Status: DC

## 2018-05-15 MED ORDER — ONDANSETRON HCL 4 MG/2ML IJ SOLN
INTRAMUSCULAR | Status: DC | PRN
  Administered 2018-05-15: 4 mg via INTRAVENOUS

## 2018-05-15 MED ORDER — HEPARIN (PORCINE) IN NACL 100-0.45 UNIT/ML-% IJ SOLN
500.0000 [IU]/h | INTRAMUSCULAR | Status: DC
  Administered 2018-05-15: 500 [IU]/h via INTRAVENOUS
  Filled 2018-05-15: qty 250

## 2018-05-15 MED ORDER — ACETAMINOPHEN 325 MG PO TABS
650.0000 mg | ORAL_TABLET | ORAL | Status: DC | PRN
  Administered 2018-05-16: 650 mg via ORAL
  Filled 2018-05-15: qty 2

## 2018-05-15 MED ORDER — LIDOCAINE HCL 1 % IJ SOLN
INTRAMUSCULAR | Status: AC | PRN
  Administered 2018-05-15: 20 mL

## 2018-05-15 MED ORDER — TOPIRAMATE 25 MG PO TABS
50.0000 mg | ORAL_TABLET | Freq: Every day | ORAL | Status: DC
  Filled 2018-05-15: qty 2

## 2018-05-15 MED ORDER — OXYCODONE HCL 5 MG PO TABS
5.0000 mg | ORAL_TABLET | Freq: Once | ORAL | Status: DC | PRN
Start: 2018-05-15 — End: 2018-05-15

## 2018-05-15 MED ORDER — ASPIRIN EC 325 MG PO TBEC: 325.0000 mg | DELAYED_RELEASE_TABLET | ORAL | Status: DC

## 2018-05-15 MED ORDER — NIMODIPINE 30 MG PO CAPS: 0.0000 mg | ORAL_CAPSULE | ORAL | Status: DC

## 2018-05-15 MED ORDER — NITROGLYCERIN 1 MG/10 ML FOR IR/CATH LAB
INTRA_ARTERIAL | Status: AC
  Filled 2018-05-15: qty 10

## 2018-05-15 MED ORDER — HEPARIN SODIUM (PORCINE) 1000 UNIT/ML IJ SOLN
INTRAMUSCULAR | Status: DC | PRN
  Administered 2018-05-15: 3000 [IU] via INTRAVENOUS
  Administered 2018-05-15 (×2): 500 [IU] via INTRAVENOUS

## 2018-05-15 MED ORDER — SCOPOLAMINE 1 MG/3DAYS TD PT72
MEDICATED_PATCH | TRANSDERMAL | Status: DC | PRN
  Administered 2018-05-15: 1 via TRANSDERMAL

## 2018-05-15 MED ORDER — FAMOTIDINE 20 MG PO TABS: 20.0000 mg | ORAL_TABLET | Freq: Every day | ORAL | Status: DC

## 2018-05-15 MED ORDER — IOHEXOL 300 MG/ML  SOLN: 96.0000 mL | Freq: Once | INTRAMUSCULAR | Status: DC | PRN

## 2018-05-15 MED ORDER — VANCOMYCIN HCL IN DEXTROSE 1-5 GM/200ML-% IV SOLN: 1000.0000 mg | Freq: Once | INTRAVENOUS | Status: DC

## 2018-05-15 MED ORDER — HEPARIN (PORCINE) IN NACL 100-0.45 UNIT/ML-% IJ SOLN
500.0000 [IU]/h | INTRAMUSCULAR | Status: AC
  Filled 2018-05-15: qty 250

## 2018-05-15 MED ORDER — SODIUM CHLORIDE 0.9 % IV SOLN
INTRAVENOUS | Status: DC
  Administered 2018-05-15 – 2018-05-16 (×2): via INTRAVENOUS

## 2018-05-15 MED ORDER — FENTANYL CITRATE (PF) 100 MCG/2ML IJ SOLN: 25.0000 ug | INTRAMUSCULAR | Status: DC | PRN

## 2018-05-15 MED ORDER — FENTANYL CITRATE (PF) 250 MCG/5ML IJ SOLN
INTRAMUSCULAR | Status: DC | PRN
  Administered 2018-05-15: 50 ug via INTRAVENOUS
  Administered 2018-05-15 (×6): 25 ug via INTRAVENOUS

## 2018-05-15 MED ORDER — NICARDIPINE HCL IN NACL 20-0.86 MG/200ML-% IV SOLN: 0.0000 mg/h | INTRAVENOUS | Status: DC

## 2018-05-15 MED ORDER — ASPIRIN 81 MG PO CHEW: 324.0000 mg | CHEWABLE_TABLET | Freq: Every day | ORAL | Status: DC

## 2018-05-15 MED ORDER — OXYCODONE HCL 5 MG/5ML PO SOLN: 5.0000 mg | Freq: Once | ORAL | Status: DC | PRN

## 2018-05-15 MED ORDER — VANCOMYCIN HCL 1000 MG IV SOLR
INTRAVENOUS | Status: DC | PRN
  Administered 2018-05-15: 1000 mg via INTRAVENOUS

## 2018-05-15 MED ORDER — SODIUM CHLORIDE 0.9 % IV SOLN
INTRAVENOUS | Status: DC | PRN
  Administered 2018-05-15: 10:00:00 via INTRAVENOUS

## 2018-05-15 MED ORDER — SCOPOLAMINE 1 MG/3DAYS TD PT72
MEDICATED_PATCH | TRANSDERMAL | Status: AC
  Filled 2018-05-15: qty 1

## 2018-05-15 MED ORDER — ACETAMINOPHEN 160 MG/5ML PO SOLN: 650.0000 mg | ORAL | Status: DC | PRN

## 2018-05-15 MED ORDER — PROTAMINE SULFATE 10 MG/ML IV SOLN
INTRAVENOUS | Status: DC | PRN
  Administered 2018-05-15: 5 mg via INTRAVENOUS

## 2018-05-15 MED ORDER — FAMOTIDINE 20 MG PO TABS
20.0000 mg | ORAL_TABLET | Freq: Every day | ORAL | Status: DC
  Administered 2018-05-16: 20 mg via ORAL
  Filled 2018-05-15: qty 1

## 2018-05-15 NOTE — Anesthesia Procedure Notes (Signed)
Arterial Line Insertion Start/End8/20/2019 9:17 AM, 05/15/2018 9:18 AM Performed by: Lavell Luster, CRNA  Preanesthetic checklist: patient identified, IV checked, site marked, risks and benefits discussed, surgical consent, monitors and equipment checked, pre-op evaluation and timeout performed Lidocaine 1% used for infiltration and patient sedated Left, radial was placed Catheter size: 20 G Hand hygiene performed , maximum sterile barriers used  and Seldinger technique used Allen's test indicative of satisfactory collateral circulation Attempts: 2 Procedure performed without using ultrasound guided technique. Ultrasound Notes:anatomy identified Following insertion, Biopatch. Post procedure assessment: normal  Patient tolerated the procedure well with no immediate complications.

## 2018-05-15 NOTE — Anesthesia Procedure Notes (Signed)
Procedure Name: MAC Date/Time: 05/15/2018 10:50 AM Performed by: Teressa Lower., CRNA Pre-anesthesia Checklist: Patient identified, Emergency Drugs available, Suction available, Patient being monitored and Timeout performed Patient Re-evaluated:Patient Re-evaluated prior to induction Oxygen Delivery Method: Nasal cannula

## 2018-05-15 NOTE — Procedures (Signed)
S/P L:t common carotid arteriogram followed by stent assisted angioplasty with distal protection of  prox ICA stenosis associated with an ulcerated plaque

## 2018-05-15 NOTE — Progress Notes (Signed)
ANTICOAGULATION CONSULT NOTE   Pharmacy Consult for Heparin Indication: post neuro-interventional radiology procedure  Allergies  Allergen Reactions  . Clams [Shellfish Allergy] Swelling    SWELLING REACTION UNSPECIFIED   . Augmentin [Amoxicillin-Pot Clavulanate] Nausea And Vomiting    Has patient had a PCN reaction causing immediate rash, facial/tongue/throat swelling, SOB or lightheadedness with hypotension: No Has patient had a PCN reaction causing severe rash involving mucus membranes or skin necrosis: No Has patient had a PCN reaction that required hospitalization: No Has patient had a PCN reaction occurring within the last 10 years: Yes If all of the above answers are "NO", then may proceed with Cephalosporin use.   . Biaxin [Clarithromycin] Nausea And Vomiting    Patient Measurements: Height: 5\' 8"  (172.7 cm) Weight: 193 lb 5.5 oz (87.7 kg) IBW/kg (Calculated) : 63.9 Weight: 83.7 kg  Vital Signs: Temp: 97.9 F (36.6 C) (08/20 1800) Temp Source: Oral (08/20 1800) BP: 107/72 (08/20 1800) Pulse Rate: 70 (08/20 1800)  Labs: Recent Labs    05/15/18 0707 05/15/18 1940  HGB 13.8  --   HCT 42.1  --   PLT 211  --   LABPROT 13.6  --   INR 1.05  --   HEPARINUNFRC  --  0.17*  CREATININE 1.24*  --     Estimated Creatinine Clearance: 52.4 mL/min (A) (by C-G formula based on SCr of 1.24 mg/dL (H)).   Medical History: Past Medical History:  Diagnosis Date  . Allergic rhinitis   . Anterior communicating artery aneurysm   . Anxiety   . Carpal tunnel syndrome   . Cyst of kidney, acquired   . Esophageal reflux   . Fibromyalgia   . GERD (gastroesophageal reflux disease)   . Headache   . HLD (hyperlipidemia)   . Hypertension   . Hypothyroidism   . Insomnia   . PONV (postoperative nausea and vomiting)     Assessment: 67 yo female s/p carotid angioplasty with stent. Heparin drip started post intervention to run until 0800 tomorrow am.  Initial heparin level came  back within goal range at 0.17, on 500 units/hr. CBC stable this morning. No s/sx of bleeding or infusion issues per nursing.   Goal of Therapy:  Heparin level 0.1-0.25 units/ml Monitor platelets by anticoagulation protocol: Yes   Plan:  Continue Heparin drip at 500 units/hr  Monitor CBC Stop time updated on heparin infusion order  Doylene Canard, PharmD Clinical Pharmacist  Pager: [phone] Phone: [phone] Please see AMION for all Pharmacists' Contact Phone Numbers 05/15/2018, 8:01 PM

## 2018-05-15 NOTE — Progress Notes (Signed)
Patient's order for Amanda Fry was under "read only" medication, so it was not showing up on RNs worklist or as a scheduled medication. Patient is calm and resting. Will continue to monitor.

## 2018-05-15 NOTE — Transfer of Care (Signed)
Immediate Anesthesia Transfer of Care Note  Patient: Amanda Fry  Procedure(s) Performed: Angioplasty with stenting (N/A )  Patient Location: PACU  Anesthesia Type:MAC  Level of Consciousness: awake, alert  and oriented  Airway & Oxygen Therapy: Patient Spontanous Breathing and Patient connected to nasal cannula oxygen  Post-op Assessment: Report given to RN and Post -op Vital signs reviewed and stable  Post vital signs: Reviewed and stable  Last Vitals:  Vitals Value Taken Time  BP 97/65 05/15/2018 12:48 PM  Temp    Pulse 57 05/15/2018 12:49 PM  Resp 13 05/15/2018 12:49 PM  SpO2 100 % 05/15/2018 12:49 PM  Vitals shown include unvalidated device data.  Last Pain:  Vitals:   05/15/18 0734  TempSrc:   PainSc: 3       Patients Stated Pain Goal: 4 (84/03/75 4360)  Complications: No apparent anesthesia complications

## 2018-05-15 NOTE — Progress Notes (Addendum)
Saw patient in neuro ICU post-procedure. Patient underwent an image-guided cerebral angiogram with proximal left ICA stenosis associated with an ulcerated plaque s/p revascularization using stent assisted angioplasty this AM with Dr. Estanislado Pandy.  Patient awake and alert laying in bed with no complaints at this time. Accompanied by 4 family members. Denies headache, weakness, numbness/tingling, dizziness, vision changes, hearing changes, tinnitus, or speech difficulty.  Alert, awake, and oriented x3. Speech and comprehension intact. PERRL bilaterally. EOMs intact bilaterally without nystagmus or subjective diplopia. Visual fields not assessed. No facial asymmetry. Tongue midline. Motor power symmetric proportional to effort. No pronator drift. Fine motor and coordination intact and symmetric. Gait not assessed. Romberg not assessed. Heel to toe not assessed. Distal pulses 1+ on right, 2+ on left. Right groin incision soft without active bleeding or hematoma.  Plan to stay in neuro ICU for overnight observation. Can raise HOB to 30 degrees at 1700. Advance diet to liquids. Continue taking Plavix 75 mg once daily and Aspirin 325 mg once daily. IR to follow.  Amanda Graff Louk, PA-C 05/15/2018, 3:02 PM

## 2018-05-15 NOTE — Anesthesia Postprocedure Evaluation (Signed)
Anesthesia Post Note  Patient: Amanda Fry  Procedure(s) Performed: Angioplasty with stenting (N/A )     Patient location during evaluation: PACU Anesthesia Type: MAC Level of consciousness: awake and alert Pain management: pain level controlled Vital Signs Assessment: post-procedure vital signs reviewed and stable Respiratory status: spontaneous breathing, nonlabored ventilation and respiratory function stable Cardiovascular status: stable and blood pressure returned to baseline Anesthetic complications: no    Last Vitals:  Vitals:   05/15/18 1330 05/15/18 1335  BP:  (!) 92/59  Pulse: (!) 57 60  Resp: 16 16  Temp:    SpO2: 100% 99%    Last Pain:  Vitals:   05/15/18 1330  TempSrc:   PainSc: 0-No pain                 Audry Pili

## 2018-05-15 NOTE — Anesthesia Preprocedure Evaluation (Addendum)
Anesthesia Evaluation  Patient identified by MRN, date of birth, ID band Patient awake    Reviewed: Allergy & Precautions, NPO status , Patient's Chart, lab work & pertinent test results  History of Anesthesia Complications (+) PONV and history of anesthetic complications  Airway Mallampati: II  TM Distance: >3 FB Neck ROM: Full    Dental  (+) Dental Advisory Given, Chipped,    Pulmonary former smoker,    breath sounds clear to auscultation       Cardiovascular hypertension, Pt. on medications + Peripheral Vascular Disease   Rhythm:Regular Rate:Normal     Neuro/Psych  Headaches, Anxiety  Insomnia  Anterior communicating artery aneurysm     GI/Hepatic Neg liver ROS, GERD  Medicated and Controlled,  Endo/Other  Hypothyroidism  Obesity   Renal/GU negative Renal ROS  negative genitourinary   Musculoskeletal  (+) Fibromyalgia -  Abdominal   Peds  Hematology negative hematology ROS (+)   Anesthesia Other Findings   Reproductive/Obstetrics                          Anesthesia Physical Anesthesia Plan  ASA: III  Anesthesia Plan: General and MAC   Post-op Pain Management:    Induction: Intravenous  PONV Risk Score and Plan: 4 or greater and Treatment may vary due to age or medical condition, Ondansetron, Dexamethasone and Propofol infusion  Airway Management Planned: Oral ETT  Additional Equipment: Arterial line  Intra-op Plan:   Post-operative Plan: Extubation in OR  Informed Consent: I have reviewed the patients History and Physical, chart, labs and discussed the procedure including the risks, benefits and alternatives for the proposed anesthesia with the patient or authorized representative who has indicated his/her understanding and acceptance.   Dental advisory given  Plan Discussed with: CRNA and Anesthesiologist  Anesthesia Plan Comments: (May begin case as MAC and  convert to GETA as indicated by procedure/surgeon)       Anesthesia Quick Evaluation

## 2018-05-15 NOTE — Progress Notes (Signed)
ANTICOAGULATION CONSULT NOTE - Initial Consult  Pharmacy Consult for Heparin Indication: post neuro-interventional radiology procedure  Allergies  Allergen Reactions  . Clams [Shellfish Allergy] Swelling    SWELLING REACTION UNSPECIFIED   . Augmentin [Amoxicillin-Pot Clavulanate] Nausea And Vomiting    Has patient had a PCN reaction causing immediate rash, facial/tongue/throat swelling, SOB or lightheadedness with hypotension: No Has patient had a PCN reaction causing severe rash involving mucus membranes or skin necrosis: No Has patient had a PCN reaction that required hospitalization: No Has patient had a PCN reaction occurring within the last 10 years: Yes If all of the above answers are "NO", then may proceed with Cephalosporin use.   . Biaxin [Clarithromycin] Nausea And Vomiting    Patient Measurements:   Weight: 83.7 kg  Vital Signs: Temp: 97.7 F (36.5 C) (08/20 1300) Temp Source: Oral (08/20 0724) BP: 92/59 (08/20 1335) Pulse Rate: 60 (08/20 1335)  Labs: Recent Labs    05/15/18 0707  HGB 13.8  HCT 42.1  PLT 211  LABPROT 13.6  INR 1.05  CREATININE 1.24*    Estimated Creatinine Clearance: 51.8 mL/min (A) (by C-G formula based on SCr of 1.24 mg/dL (H)).   Medical History: Past Medical History:  Diagnosis Date  . Allergic rhinitis   . Anterior communicating artery aneurysm   . Anxiety   . Carpal tunnel syndrome   . Cyst of kidney, acquired   . Esophageal reflux   . Fibromyalgia   . GERD (gastroesophageal reflux disease)   . Headache   . HLD (hyperlipidemia)   . Hypertension   . Hypothyroidism   . Insomnia   . PONV (postoperative nausea and vomiting)     Assessment: 67 yo female s/p carotid angioplasty with stent. Heparin drip started post intervention to run until 0800 tomorrow am.  Goal of Therapy:  Heparin level 0.1-0.25 units/ml Monitor platelets by anticoagulation protocol: Yes   Plan:  Continue Heparin drip at 500 units/hr  Check HL 6  hours after drip was started Monitor CBC  Alanda Slim, PharmD, [facility] Clinical Pharmacist Please see AMION for all Pharmacists' Contact Phone Numbers 05/15/2018, 2:03 PM

## 2018-05-15 NOTE — H&P (Signed)
Chief Complaint: Patient was seen in consultation today for left internal carotid artery stent placement at the request of Dr Alfonso Patten Tat   Supervising Physician: Luanne Bras  Patient Status: [facility] - Out-pt  History of Present Illness: BASIA MCGINTY is a 67 y.o. 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 Approximately 65% stenosis of the left internal carotid artery proximally associated with a saccular entity along its medial wall most likely representing an ulcerated plaque, measuring 5.8 mm x 4 mm. Approximately 30% stenosis of the right internal carotid artery proximally by the NASCET criteria. Approximately 3 mm wide-based aneurysmal outpouching arising from the inferior aspect of the petrous cavernous right ICA.  Procedure was aborted Now scheduled for Left Internal Carotid Artery stent placement Plan for ACOM aneurysm embolization at later date  Pt has known Dye allergy Pt has been pre medicated with Prednisone 50 mg #3 and Benadryl 50 mg #1  Past Medical History:  Diagnosis Date  . Allergic rhinitis   . Anterior communicating artery aneurysm   . Anxiety   . Carpal tunnel syndrome   . Cyst of kidney, acquired   . Esophageal reflux   . Fibromyalgia   . GERD (gastroesophageal reflux disease)   . Headache   . HLD (hyperlipidemia)   . Hypertension   . Hypothyroidism   . Insomnia   . PONV (postoperative nausea and vomiting)     Past Surgical History:  Procedure Laterality Date  . APPENDECTOMY  2001  . BREAST LUMPECTOMY Left 1994  . CESAREAN SECTION    . IR ANGIO INTRA EXTRACRAN SEL COM CAROTID INNOMINATE BILAT MOD SED  05/10/2018  . IR ANGIO VERTEBRAL SEL VERTEBRAL BILAT MOD SED  05/10/2018  . PARTIAL HYSTERECTOMY  1994  . RADIOLOGY WITH  ANESTHESIA N/A 05/10/2018   Procedure: EMBOLIZATION;  Surgeon: Luanne Bras, MD;  Location: [facility];  Service: Radiology;  Laterality: N/A;  . TONSILLECTOMY      Allergies: Clams [shellfish allergy]; Augmentin [amoxicillin-pot clavulanate]; and Biaxin [clarithromycin]  Medications: Prior to Admission medications   Medication Sig Start Date End Date Taking? Authorizing Provider  ARTIFICIAL TEAR SOLUTION OP Place 1 drop into both eyes daily as needed (dry eyes).   Yes [provider]  aspirin EC 325 MG tablet Take 325 mg by mouth at bedtime.   Yes [provider]  atorvastatin (LIPITOR) 40 MG tablet Take 1 tablet (40 mg total) by mouth daily. Patient taking differently: Take 40 mg by mouth at bedtime.  08/15/17 05/15/18 Yes Josue Hector, MD  Calcium Carb-Cholecalciferol (CALCIUM 600+D) 600-800 MG-UNIT TABS Take 1 tablet by mouth at bedtime.   Yes [provider]  cetirizine (ZYRTEC) 10 MG tablet Take 10 mg by mouth at bedtime.    Yes [provider]  cholecalciferol (VITAMIN D) 1000 units tablet Take 1,000 Units by mouth 2 (two) times daily.   Yes [provider]  clopidogrel (PLAVIX) 75 MG tablet Take 75 mg by mouth at bedtime.   Yes [provider]  diphenhydrAMINE (BENADRYL) 25 mg capsule TAKE 2 CAPS BY MOUTH ON AUG 15 @7 :30AM 04/19/18  Yes [provider]  Glucos-Chond-Hyal Ac-Ca Fructo (MOVE FREE JOINT HEALTH ADVANCE PO) Take 1 tablet by mouth at bedtime.    Yes [provider]  Magnesium 500 MG CAPS Take 500 mg by mouth at  bedtime.    Yes [provider]  Melatonin 5 MG CAPS Take 5 mg by mouth at bedtime.   Yes [provider]  predniSONE (DELTASONE) 50 MG tablet Take 50 mg by mouth daily with breakfast. Take 8:30 PM night before surgery, 1:45AM, and 8:30AM day of surgery.   Yes [provider]  propranolol ER (INDERAL LA) 80 MG 24 hr capsule Take 1 capsule (80 mg total) by mouth daily.  01/24/17  Yes Tat, Eustace Quail, DO  ranitidine (ZANTAC) 150 MG tablet Take 150 mg by mouth at bedtime.    Yes [provider]  sodium chloride (OCEAN) 0.65 % SOLN nasal spray Place 1 spray into both nostrils as needed for congestion.   Yes [provider]  SUMAtriptan (IMITREX) 100 MG tablet Take 1 tablet (100 mg total) by mouth once. May repeat in 2 hours if headache persists or recurs. Not to exceed 2 per day. 03/16/16  Yes Tat, Eustace Quail, DO  SYNTHROID 112 MCG tablet Take 112 mcg by mouth daily. 01/18/17  Yes [provider]  topiramate (TOPAMAX) 50 MG tablet Take 1 tablet (50 mg total) by mouth daily. 12/14/17  Yes Tat, Eustace Quail, DO  zolpidem (AMBIEN) 5 MG tablet Take 5 mg at bedtime by mouth.   Yes [provider]     Family History  Problem Relation Age of Onset  . CAD Father 40       CABG  . Cancer Mother        BREAST  . Healthy Brother   . Healthy Brother   . Diabetes Sister   . Cancer Sister        BREAST  . Healthy Son   . Healthy Daughter   . Heart disease Maternal Grandmother   . Heart disease Maternal Grandfather   . Emphysema Paternal Grandfather     Social History   Socioeconomic History  . Marital status: Married    Spouse name: Not on file  . Number of children: Not on file  . Years of education: Not on file  . Highest education level: Not on file  Occupational History  . Not on file  Social Needs  . Financial resource strain: Not on file  . Food insecurity:    Worry: Not on file    Inability: Not on file  . Transportation needs:    Medical: Not on file    Non-medical: Not on file  Tobacco Use  . Smoking status: Former Smoker    Packs/day: 0.50    Years: 10.00    Pack years: 5.00    Types: Cigarettes    Last attempt to quit: 09/26/2009    Years since quitting: 8.6  . Smokeless tobacco: Never Used  Substance and Sexual Activity  . Alcohol use: Yes    Alcohol/week: 7.0 standard drinks    Types: 7 Glasses of wine per  week  . Drug use: No  . Sexual activity: Not on file  Lifestyle  . Physical activity:    Days per week: Not on file    Minutes per session: Not on file  . Stress: Not on file  Relationships  . Social connections:    Talks on phone: Not on file    Gets together: Not on file    Attends religious service: Not on file    Active member of club or organization: Not on file    Attends meetings of clubs or organizations: Not on file  Relationship status: Not on file  Other Topics Concern  . Not on file  Social History Narrative  . Not on file    Review of Systems: A 12 point ROS discussed and pertinent positives are indicated in the HPI above.  All other systems are negative.  Review of Systems  Constitutional: Negative for activity change, appetite change, fatigue, fever and unexpected weight change.  HENT: Negative for hearing loss and tinnitus.   Eyes: Negative for visual disturbance.  Respiratory: Negative for cough and shortness of breath.   Cardiovascular: Negative for chest pain.  Gastrointestinal: Negative for abdominal pain.  Musculoskeletal: Negative for back pain.  Neurological: Negative for dizziness, tremors, seizures, syncope, facial asymmetry, speech difficulty, weakness, light-headedness, numbness and headaches.  Psychiatric/Behavioral: Negative for behavioral problems and confusion.    Vital Signs: BP (!) 140/94   Pulse 67   Temp 98.7 F (37.1 C) (Oral)   Resp 18   SpO2 100%   Physical Exam  Constitutional: She is oriented to person, place, and time. She appears well-nourished.  HENT:  Head: Atraumatic.  Eyes: EOM are normal.  Neck: Neck supple.  Cardiovascular: Normal rate, regular rhythm and normal heart sounds.  Pulmonary/Chest: Effort normal and breath sounds normal. She has no wheezes.  Abdominal: Soft. Bowel sounds are normal. There is no tenderness.  Musculoskeletal: Normal range of motion.  Neurological: She is alert and oriented to person,  place, and time.  Skin: Skin is warm and dry.  Psychiatric: She has a normal mood and affect. Her behavior is normal. Judgment and thought content normal.  Nursing note and vitals reviewed.   Imaging: Ir Angio Intra Extracran Sel Com Carotid Innominate Bilat Mod Sed  Result Date: 05/14/2018 CLINICAL DATA:  History of headaches. Discovery of intracranial aneurysm. EXAM: BILATERAL COMMON CAROTID AND INNOMINATE ANGIOGRAPHY; IR ANGIO VERTEBRAL SEL VERTEBRAL BILAT MOD SED COMPARISON:  Recent MRI and MRA of the brain performed at an outside institution. MEDICATIONS: Heparin 1000 units IV; vancomycin 1 g IV antibiotic was administered within 1 hour of the procedure. ANESTHESIA/SEDATION: Mac anesthesia as per the Department of Anesthesiology at [place]:  Isovue 300 approximately 65 cc. FLUOROSCOPY TIME:  Fluoroscopy Time: 14 minutes 18 seconds (090 mGy). COMPLICATIONS: None immediate. TECHNIQUE: Informed written consent was obtained from the patient after a thorough discussion of the procedural risks, benefits and alternatives. All questions were addressed. Maximal Sterile Barrier Technique was utilized including caps, mask, sterile gowns, sterile gloves, sterile drape, hand hygiene and skin antiseptic. A timeout was performed prior to the initiation of the procedure. The right groin was prepped and draped in the usual sterile fashion. Thereafter using modified Seldinger technique, transfemoral access into the right common femoral artery was obtained without difficulty. Over a 0.035 inch guidewire, a 5 French Pinnacle sheath was inserted. Through this, and also over 0.035 inch guidewire, a 5 [place] JB 1 catheter was advanced to the aortic arch region and selectively positioned in the right common carotid artery, the right vertebral artery, the left common carotid artery and the left vertebral artery. FINDINGS: The right vertebral artery origin is widely patent. The vessel is seen to opacify  normally to the cranial skull base. Wide patency is seen of the right vertebrobasilar junction and the right posterior-inferior cerebellar artery. The opacified portion of the basilar artery, the left posterior cerebral artery, the superior cerebellar arteries and the anterior-inferior cerebellar arteries is normal into the capillary and venous phases. Inflow of unopacified blood is seen in the  basilar artery from the contralateral vertebral artery. Nonvisualization of the right posterior cerebral artery is secondary to the dominant right posterior communicating artery opacifying from the right internal carotid artery as described below. The right common carotid arteriogram demonstrates the right external carotid artery and its major branches to be widely patent. The right internal carotid artery just distal to the bulb has approximately 30% stenosis as per NASCET criteria. No acute ulcerations or filling defects are seen. More distally, the right internal carotid artery is seen to opacify normally to the cranial skull base. The petrous, cavernous and the supraclinoid segments are widely patent. Rising from the petrous cavernous junction of the right internal carotid artery is a wide neck saccular outpouching measuring approximately 3 mm. The right posterior communicating artery is seen opacifying the right posterior cerebral artery distribution. The right middle cerebral artery opacifies normally into the capillary and venous phases. There is nonvisualization of the right anterior cerebral artery A1 segment. The origin of the left vertebral artery is widely patent. The vessel is seen to opacify normally to the cranial skull base. Wide patency is seen of the left posterior-inferior cerebellar artery and the left vertebrobasilar junction. The basilar artery, the left posterior cerebral artery, the superior cerebellar arteries and the anterior-inferior cerebellar arteries opacify into the capillary and venous phases.  A mild focal area of stenosis is seen of the proximal basilar artery just distal to the confluence of the vertebrobasilar junctions. The left common carotid arteriogram demonstrates the left external carotid artery and its major branches to be widely patent. The left internal carotid artery just distal to the bulb has an irregular plaque associated with an approximately 65% stenosis distally. Medial to this is an approximately 5.8 mm x 4 mm saccular entity probably representing an ulcerated plaque. Distal to this the left internal carotid artery is seen to opacify widely to the cranial skull base. The petrous, cavernous and the supraclinoid segments are widely patent. The left middle cerebral artery and the left anterior cerebral artery opacify into the capillary and venous phases. Cross filling via the anterior communicating artery of the right anterior cerebral artery A2 segment and distally is noted. Arising in the anterior communicating artery region and projecting anterior inferiorly is a saccular aneurysm measuring approximately 3.1 mm x 2.8 mm. 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 Approximately 65% stenosis of the left internal carotid artery proximally associated with a saccular entity along its medial wall most likely representing an ulcerated plaque, measuring 5.8 mm x 4 mm. Approximately 30% stenosis of the right internal carotid artery proximally by the NASCET criteria. Approximately 3 mm wide-based aneurysmal outpouching arising from the inferior aspect of the petrous cavernous right ICA. PLAN: Findings of the angiogram were reviewed with patient and the patient's husband and daughter. Given the ulcerated lesion associated with an approximately 65% stenosis of the left internal carotid proximally, endovascular treatment of the anterior communicating artery region aneurysm through this lesion could result in  intracranial complications of thromboembolic stroke. Therefore, prior to the endovascular treatment of the anterior communicating artery region aneurysm, revascularization with stability at the site of the stenosis of the left internal carotid artery due to the ulcerated plaque would require treatment. This was explained to the patient and the family. The patient will be left on aspirin 325 mg, and Plavix 75 mg a day. Endovascular treatment of the left internal carotid artery 65% stenosis with ulcerated plaque will be  scheduled in the next few days prior to endovascular treatment of the intracranial anterior communicating artery region aneurysm. Questions were answered to their satisfaction. The patient would like to proceed with the above management plan. Electronically Signed   By: Luanne Bras M.D.   On: 05/10/2018 11:10   Ir Angio Vertebral Sel Vertebral Bilat Mod Sed  Result Date: 05/14/2018 CLINICAL DATA:  History of headaches. Discovery of intracranial aneurysm. EXAM: BILATERAL COMMON CAROTID AND INNOMINATE ANGIOGRAPHY; IR ANGIO VERTEBRAL SEL VERTEBRAL BILAT MOD SED COMPARISON:  Recent MRI and MRA of the brain performed at an outside institution. MEDICATIONS: Heparin 1000 units IV; vancomycin 1 g IV antibiotic was administered within 1 hour of the procedure. ANESTHESIA/SEDATION: Mac anesthesia as per the Department of Anesthesiology at [place]:  Isovue 300 approximately 65 cc. FLUOROSCOPY TIME:  Fluoroscopy Time: 14 minutes 18 seconds (090 mGy). COMPLICATIONS: None immediate. TECHNIQUE: Informed written consent was obtained from the patient after a thorough discussion of the procedural risks, benefits and alternatives. All questions were addressed. Maximal Sterile Barrier Technique was utilized including caps, mask, sterile gowns, sterile gloves, sterile drape, hand hygiene and skin antiseptic. A timeout was performed prior to the initiation of the procedure. The right groin  was prepped and draped in the usual sterile fashion. Thereafter using modified Seldinger technique, transfemoral access into the right common femoral artery was obtained without difficulty. Over a 0.035 inch guidewire, a 5 French Pinnacle sheath was inserted. Through this, and also over 0.035 inch guidewire, a 5 [place] JB 1 catheter was advanced to the aortic arch region and selectively positioned in the right common carotid artery, the right vertebral artery, the left common carotid artery and the left vertebral artery. FINDINGS: The right vertebral artery origin is widely patent. The vessel is seen to opacify normally to the cranial skull base. Wide patency is seen of the right vertebrobasilar junction and the right posterior-inferior cerebellar artery. The opacified portion of the basilar artery, the left posterior cerebral artery, the superior cerebellar arteries and the anterior-inferior cerebellar arteries is normal into the capillary and venous phases. Inflow of unopacified blood is seen in the basilar artery from the contralateral vertebral artery. Nonvisualization of the right posterior cerebral artery is secondary to the dominant right posterior communicating artery opacifying from the right internal carotid artery as described below. The right common carotid arteriogram demonstrates the right external carotid artery and its major branches to be widely patent. The right internal carotid artery just distal to the bulb has approximately 30% stenosis as per NASCET criteria. No acute ulcerations or filling defects are seen. More distally, the right internal carotid artery is seen to opacify normally to the cranial skull base. The petrous, cavernous and the supraclinoid segments are widely patent. Rising from the petrous cavernous junction of the right internal carotid artery is a wide neck saccular outpouching measuring approximately 3 mm. The right posterior communicating artery is seen opacifying the right  posterior cerebral artery distribution. The right middle cerebral artery opacifies normally into the capillary and venous phases. There is nonvisualization of the right anterior cerebral artery A1 segment. The origin of the left vertebral artery is widely patent. The vessel is seen to opacify normally to the cranial skull base. Wide patency is seen of the left posterior-inferior cerebellar artery and the left vertebrobasilar junction. The basilar artery, the left posterior cerebral artery, the superior cerebellar arteries and the anterior-inferior cerebellar arteries opacify into the capillary and venous phases. A mild focal area of  stenosis is seen of the proximal basilar artery just distal to the confluence of the vertebrobasilar junctions. The left common carotid arteriogram demonstrates the left external carotid artery and its major branches to be widely patent. The left internal carotid artery just distal to the bulb has an irregular plaque associated with an approximately 65% stenosis distally. Medial to this is an approximately 5.8 mm x 4 mm saccular entity probably representing an ulcerated plaque. Distal to this the left internal carotid artery is seen to opacify widely to the cranial skull base. The petrous, cavernous and the supraclinoid segments are widely patent. The left middle cerebral artery and the left anterior cerebral artery opacify into the capillary and venous phases. Cross filling via the anterior communicating artery of the right anterior cerebral artery A2 segment and distally is noted. Arising in the anterior communicating artery region and projecting anterior inferiorly is a saccular aneurysm measuring approximately 3.1 mm x 2.8 mm. 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 Approximately 65% stenosis of the left internal carotid artery proximally associated with a saccular entity along its medial wall  most likely representing an ulcerated plaque, measuring 5.8 mm x 4 mm. Approximately 30% stenosis of the right internal carotid artery proximally by the NASCET criteria. Approximately 3 mm wide-based aneurysmal outpouching arising from the inferior aspect of the petrous cavernous right ICA. PLAN: Findings of the angiogram were reviewed with patient and the patient's husband and daughter. Given the ulcerated lesion associated with an approximately 65% stenosis of the left internal carotid proximally, endovascular treatment of the anterior communicating artery region aneurysm through this lesion could result in intracranial complications of thromboembolic stroke. Therefore, prior to the endovascular treatment of the anterior communicating artery region aneurysm, revascularization with stability at the site of the stenosis of the left internal carotid artery due to the ulcerated plaque would require treatment. This was explained to the patient and the family. The patient will be left on aspirin 325 mg, and Plavix 75 mg a day. 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 Questions were answered to their satisfaction. The patient would like to proceed with the above management plan. Electronically Signed   By: Luanne Bras M.D.   On: 05/10/2018 11:10    Labs:  CBC: Recent Labs    05/07/18 1100 05/15/18 0707  WBC 5.0 6.0  HGB 13.3 13.8  HCT 42.1 42.1  PLT 211 211    COAGS: Recent Labs    05/07/18 1100 05/15/18 0707  INR 1.06 1.05    BMP: Recent Labs    05/07/18 1100 05/15/18 0707  NA 143 139  K 4.2 4.1  CL 108 111  CO2 27 20*  GLUCOSE 104* 147*  BUN 15 22  CALCIUM 9.5 9.3  CREATININE 1.05* 1.24*  GFRNONAA 55* 45*  GFRAA >60 52*    LIVER FUNCTION TESTS: Recent Labs    10/17/17 0808    BILITOT 0.2  AST 27  ALT 36*  ALKPHOS 59  PROT 6.2  ALBUMIN 4.3    TUMOR MARKERS: No results for input(s): AFPTM, CEA, CA199, CHROMGRNA in the last 8760 hours.  Assessment and Plan:  Known ACOM aneurysm -- was scheduled for aneurysm embolization 8/15 but cerebral arteriogram revealed L ICA ulcerated plaque Rescheduled for Left internal artery stent placement today  and plan for ACOM aneurysm embolization at later date.  Risks and benefits of cerebral angiogram with intervention were discussed with the patient including, but not limited to bleeding, infection, vascular injury, contrast induced renal failure, stroke or even death. This interventional procedure involves the use of X-rays and because of the nature of the planned procedure, it is possible that we will have prolonged use of X-ray fluoroscopy. Potential radiation risks to you include (but are not limited to) the following: - A slightly elevated risk for cancer  several years later in life. This risk is typically less than 0.5% percent. This risk is low in comparison to the normal incidence of human cancer, which is 33% for women and 50% for men according to the [place]. - Radiation induced injury can include skin redness, resembling a rash, tissue breakdown / ulcers and hair loss (which can be temporary or permanent).  The likelihood of either of these occurring depends on the difficulty of the procedure and whether you are sensitive to radiation due to previous procedures, disease, or genetic conditions.   IF your procedure requires a prolonged use of radiation, you will be notified and given written instructions for further action.  It is your responsibility to monitor the irradiated area for the 2 weeks following the procedure and to notify your physician if you are concerned that you have suffered a radiation induced injury.    All of the patient's questions were answered, patient is agreeable to  proceed.  Consent signed and in chart. Pt is aware if intervention is performed; she will be admitted to Neuro ICU overnight and plan for discharge to home tomorrow Agreeable to proceed   Thank you for this interesting consult.  I greatly enjoyed meeting JAYLIAH BENETT and look forward to participating in their care.  A copy of this report was sent to the requesting provider on this date.  Electronically Signed: Lavonia Drafts, PA-C 05/15/2018, 8:15 AM   I spent a total of    25 Minutes in face to face in clinical consultation, greater than 50% of which was counseling/coordinating care for L ICA stent placement

## 2018-05-16 ENCOUNTER — Encounter (HOSPITAL_COMMUNITY): Payer: Self-pay | Admitting: Interventional Radiology

## 2018-05-16 LAB — BASIC METABOLIC PANEL
Anion gap: 5 (ref 5–15)
BUN: 18 mg/dL (ref 8–23)
CHLORIDE: 115 mmol/L — AB (ref 98–111)
CO2: 21 mmol/L — ABNORMAL LOW (ref 22–32)
CREATININE: 0.99 mg/dL (ref 0.44–1.00)
Calcium: 8.5 mg/dL — ABNORMAL LOW (ref 8.9–10.3)
GFR calc non Af Amer: 59 mL/min — ABNORMAL LOW (ref >60)
Glucose, Bld: 98 mg/dL (ref 70–99)
Potassium: 3.8 mmol/L (ref 3.5–5.1)
SODIUM: 141 mmol/L (ref 135–145)

## 2018-05-16 LAB — CBC WITH DIFFERENTIAL/PLATELET
Abs Immature Granulocytes: 0 10*3/uL (ref 0.0–0.1)
BASOS ABS: 0 10*3/uL (ref 0.0–0.1)
BASOS PCT: 0 %
EOS ABS: 0 10*3/uL (ref 0.0–0.7)
Eosinophils Relative: 0 %
HCT: 32.7 % — ABNORMAL LOW (ref 36.0–46.0)
Hemoglobin: 10.5 g/dL — ABNORMAL LOW (ref 12.0–15.0)
IMMATURE GRANULOCYTES: 0 %
Lymphocytes Relative: 18 %
Lymphs Abs: 1.7 10*3/uL (ref 0.7–4.0)
MCH: 30.7 pg (ref 26.0–34.0)
MCHC: 32.1 g/dL (ref 30.0–36.0)
MCV: 95.6 fL (ref 78.0–100.0)
Monocytes Absolute: 0.9 10*3/uL (ref 0.1–1.0)
Monocytes Relative: 10 %
NEUTROS PCT: 72 %
Neutro Abs: 6.8 10*3/uL (ref 1.7–7.7)
PLATELETS: 174 10*3/uL (ref 150–400)
RBC: 3.42 MIL/uL — AB (ref 3.87–5.11)
RDW: 12.3 % (ref 11.5–15.5)
WBC: 9.5 10*3/uL (ref 4.0–10.5)

## 2018-05-16 NOTE — Plan of Care (Signed)
Pt progressing overnight, will continue to progress patient today in preparation for discharge this afternoon.

## 2018-05-16 NOTE — Discharge Summary (Signed)
Patient ID: [name] MRN: [id number] DOB/AGE: [birth year] 67 y.o.  Admit date: 05/15/2018 Discharge date: 05/16/2018  Supervising Physician: Luanne Bras  Patient Status: [facility] - In-pt  Admission Diagnoses: Carotid stenosis, asymptomatic, left side  Discharge Diagnoses:  Active Problems:   Carotid stenosis, asymptomatic, unspecified laterality   Discharged Condition: stable  Hospital Course:  Patient presented to [facility] 05/15/2018 for an image-guided cerebral angiogram with revascularization of her left carotid stenosis using stent assisted angioplasty with Dr. Estanislado Pandy. Procedure occurred without major complications and patient was transferred to PACU, VSS, neurologically intact, right groin incision stable. She was then transferred to neuro ICU for overnight observation. No major events occurred overnight.  Patient awake and alert laying in bed with no complaints at this time. Denies headache, weakness, numbness/tingling, vision changes, hearing changes, tinnitus, or speech difficulty. Plan to discharge home today and follow-up with Dr. Estanislado Pandy in clinic 2 weeks after discharge.  Consults: None  Significant Diagnostic Studies: Ir Angio Intra Extracran Sel Com Carotid Innominate Bilat Mod Sed  Result Date: 05/14/2018 CLINICAL DATA:  History of headaches. Discovery of intracranial aneurysm. EXAM: BILATERAL COMMON CAROTID AND INNOMINATE ANGIOGRAPHY; IR ANGIO VERTEBRAL SEL VERTEBRAL BILAT MOD SED COMPARISON:  Recent MRI and MRA of the brain performed at an outside institution. MEDICATIONS: Heparin 1000 units IV; vancomycin 1 g IV antibiotic was administered within 1 hour of the procedure. ANESTHESIA/SEDATION: Mac anesthesia as per the Department of Anesthesiology at [place]:  Isovue 300 approximately 65 cc. FLUOROSCOPY TIME:  Fluoroscopy Time: 14 minutes 18 seconds (090 mGy). COMPLICATIONS: None immediate. TECHNIQUE: Informed written consent was  obtained from the patient after a thorough discussion of the procedural risks, benefits and alternatives. All questions were addressed. Maximal Sterile Barrier Technique was utilized including caps, mask, sterile gowns, sterile gloves, sterile drape, hand hygiene and skin antiseptic. A timeout was performed prior to the initiation of the procedure. The right groin was prepped and draped in the usual sterile fashion. Thereafter using modified Seldinger technique, transfemoral access into the right common femoral artery was obtained without difficulty. Over a 0.035 inch guidewire, a 5 French Pinnacle sheath was inserted. Through this, and also over 0.035 inch guidewire, a 5 [place] JB 1 catheter was advanced to the aortic arch region and selectively positioned in the right common carotid artery, the right vertebral artery, the left common carotid artery and the left vertebral artery. FINDINGS: The right vertebral artery origin is widely patent. The vessel is seen to opacify normally to the cranial skull base. Wide patency is seen of the right vertebrobasilar junction and the right posterior-inferior cerebellar artery. The opacified portion of the basilar artery, the left posterior cerebral artery, the superior cerebellar arteries and the anterior-inferior cerebellar arteries is normal into the capillary and venous phases. Inflow of unopacified blood is seen in the basilar artery from the contralateral vertebral artery. Nonvisualization of the right posterior cerebral artery is secondary to the dominant right posterior communicating artery opacifying from the right internal carotid artery as described below. The right common carotid arteriogram demonstrates the right external carotid artery and its major branches to be widely patent. The right internal carotid artery just distal to the bulb has approximately 30% stenosis as per NASCET criteria. No acute ulcerations or filling defects are seen. More distally, the right  internal carotid artery is seen to opacify normally to the cranial skull base. The petrous, cavernous and the supraclinoid segments are widely patent. Rising from the petrous cavernous junction of the  right internal carotid artery is a wide neck saccular outpouching measuring approximately 3 mm. The right posterior communicating artery is seen opacifying the right posterior cerebral artery distribution. The right middle cerebral artery opacifies normally into the capillary and venous phases. There is nonvisualization of the right anterior cerebral artery A1 segment. The origin of the left vertebral artery is widely patent. The vessel is seen to opacify normally to the cranial skull base. Wide patency is seen of the left posterior-inferior cerebellar artery and the left vertebrobasilar junction. The basilar artery, the left posterior cerebral artery, the superior cerebellar arteries and the anterior-inferior cerebellar arteries opacify into the capillary and venous phases. A mild focal area of stenosis is seen of the proximal basilar artery just distal to the confluence of the vertebrobasilar junctions. The left common carotid arteriogram demonstrates the left external carotid artery and its major branches to be widely patent. The left internal carotid artery just distal to the bulb has an irregular plaque associated with an approximately 65% stenosis distally. Medial to this is an approximately 5.8 mm x 4 mm saccular entity probably representing an ulcerated plaque. Distal to this the left internal carotid artery is seen to opacify widely to the cranial skull base. The petrous, cavernous and the supraclinoid segments are widely patent. The left middle cerebral artery and the left anterior cerebral artery opacify into the capillary and venous phases. Cross filling via the anterior communicating artery of the right anterior cerebral artery A2 segment and distally is noted. Arising in the anterior communicating artery  region and projecting anterior inferiorly is a saccular aneurysm measuring approximately 3.1 mm x 2.8 mm. 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 Approximately 65% stenosis of the left internal carotid artery proximally associated with a saccular entity along its medial wall most likely representing an ulcerated plaque, measuring 5.8 mm x 4 mm. Approximately 30% stenosis of the right internal carotid artery proximally by the NASCET criteria. Approximately 3 mm wide-based aneurysmal outpouching arising from the inferior aspect of the petrous cavernous right ICA. PLAN: Findings of the angiogram were reviewed with patient and the patient's husband and daughter. Given the ulcerated lesion associated with an approximately 65% stenosis of the left internal carotid proximally, endovascular treatment of the anterior communicating artery region aneurysm through this lesion could result in intracranial complications of thromboembolic stroke. Therefore, prior to the endovascular treatment of the anterior communicating artery region aneurysm, revascularization with stability at the site of the stenosis of the left internal carotid artery due to the ulcerated plaque would require treatment. This was explained to the patient and the family. The patient will be left on aspirin 325 mg, and Plavix 75 mg a day. 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 Questions were answered to their satisfaction. The patient would like to proceed with the above management plan. Electronically Signed   By: Luanne Bras M.D.   On: 05/10/2018 11:10   Ir Angio Vertebral Sel Vertebral Bilat Mod Sed  Result Date: 05/14/2018 CLINICAL DATA:  History of headaches. Discovery  of intracranial aneurysm. EXAM: BILATERAL COMMON CAROTID AND INNOMINATE ANGIOGRAPHY; IR ANGIO VERTEBRAL SEL VERTEBRAL BILAT MOD SED COMPARISON:  Recent MRI and MRA of the brain performed at an outside institution. MEDICATIONS: Heparin 1000 units IV; vancomycin 1 g IV antibiotic was  administered within 1 hour of the procedure. ANESTHESIA/SEDATION: Mac anesthesia as per the Department of Anesthesiology at [place]:  Isovue 300 approximately 65 cc. FLUOROSCOPY TIME:  Fluoroscopy Time: 14 minutes 18 seconds (090 mGy). COMPLICATIONS: None immediate. TECHNIQUE: Informed written consent was obtained from the patient after a thorough discussion of the procedural risks, benefits and alternatives. All questions were addressed. Maximal Sterile Barrier Technique was utilized including caps, mask, sterile gowns, sterile gloves, sterile drape, hand hygiene and skin antiseptic. A timeout was performed prior to the initiation of the procedure. The right groin was prepped and draped in the usual sterile fashion. Thereafter using modified Seldinger technique, transfemoral access into the right common femoral artery was obtained without difficulty. Over a 0.035 inch guidewire, a 5 French Pinnacle sheath was inserted. Through this, and also over 0.035 inch guidewire, a 5 [place] JB 1 catheter was advanced to the aortic arch region and selectively positioned in the right common carotid artery, the right vertebral artery, the left common carotid artery and the left vertebral artery. FINDINGS: The right vertebral artery origin is widely patent. The vessel is seen to opacify normally to the cranial skull base. Wide patency is seen of the right vertebrobasilar junction and the right posterior-inferior cerebellar artery. The opacified portion of the basilar artery, the left posterior cerebral artery, the superior cerebellar arteries and the anterior-inferior cerebellar arteries is normal into the capillary and venous  phases. Inflow of unopacified blood is seen in the basilar artery from the contralateral vertebral artery. Nonvisualization of the right posterior cerebral artery is secondary to the dominant right posterior communicating artery opacifying from the right internal carotid artery as described below. The right common carotid arteriogram demonstrates the right external carotid artery and its major branches to be widely patent. The right internal carotid artery just distal to the bulb has approximately 30% stenosis as per NASCET criteria. No acute ulcerations or filling defects are seen. More distally, the right internal carotid artery is seen to opacify normally to the cranial skull base. The petrous, cavernous and the supraclinoid segments are widely patent. Rising from the petrous cavernous junction of the right internal carotid artery is a wide neck saccular outpouching measuring approximately 3 mm. The right posterior communicating artery is seen opacifying the right posterior cerebral artery distribution. The right middle cerebral artery opacifies normally into the capillary and venous phases. There is nonvisualization of the right anterior cerebral artery A1 segment. The origin of the left vertebral artery is widely patent. The vessel is seen to opacify normally to the cranial skull base. Wide patency is seen of the left posterior-inferior cerebellar artery and the left vertebrobasilar junction. The basilar artery, the left posterior cerebral artery, the superior cerebellar arteries and the anterior-inferior cerebellar arteries opacify into the capillary and venous phases. A mild focal area of stenosis is seen of the proximal basilar artery just distal to the confluence of the vertebrobasilar junctions. The left common carotid arteriogram demonstrates the left external carotid artery and its major branches to be widely patent. The left internal carotid artery just distal to the bulb has an irregular plaque  associated with an approximately 65% stenosis distally. Medial to this is an approximately 5.8 mm x 4 mm saccular entity probably representing an ulcerated plaque. Distal to this the left internal carotid artery is seen to opacify widely to the cranial skull base. The petrous, cavernous and the supraclinoid segments are widely patent. The left middle cerebral artery and the left anterior cerebral  artery opacify into the capillary and venous phases. Cross filling via the anterior communicating artery of the right anterior cerebral artery A2 segment and distally is noted. Arising in the anterior communicating artery region and projecting anterior inferiorly is a saccular aneurysm measuring approximately 3.1 mm x 2.8 mm. 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 Approximately 65% stenosis of the left internal carotid artery proximally associated with a saccular entity along its medial wall most likely representing an ulcerated plaque, measuring 5.8 mm x 4 mm. Approximately 30% stenosis of the right internal carotid artery proximally by the NASCET criteria. Approximately 3 mm wide-based aneurysmal outpouching arising from the inferior aspect of the petrous cavernous right ICA. PLAN: Findings of the angiogram were reviewed with patient and the patient's husband and daughter. Given the ulcerated lesion associated with an approximately 65% stenosis of the left internal carotid proximally, endovascular treatment of the anterior communicating artery region aneurysm through this lesion could result in intracranial complications of thromboembolic stroke. Therefore, prior to the endovascular treatment of the anterior communicating artery region aneurysm, revascularization with stability at the site of the stenosis of the left internal carotid artery due to the ulcerated plaque would require treatment. This was explained to the patient and the  family. The patient will be left on aspirin 325 mg, and Plavix 75 mg a day. 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 Questions were answered to their satisfaction. The patient would like to proceed with the above management plan. Electronically Signed   By: Luanne Bras M.D.   On: 05/10/2018 11:10    Treatments: Procedures: Left common carotid arteriogram followed by stent assisted angioplasty with distal protection of proximal ICA stenosis associated with an ulcerated plaque.  Discharge Exam: Blood pressure 117/70, pulse (!) 54, temperature 97.8 F (36.6 C), temperature source Oral, resp. rate 16, height _0  (1.727 m), weight 193 lb 5.5 oz (87.7 kg), SpO2 100 %. Physical Exam  Constitutional: She appears well-developed and well-nourished. No distress.  Cardiovascular: Normal rate, regular rhythm and normal heart sounds.  No murmur heard. Pulmonary/Chest: Effort normal and breath sounds normal. No respiratory distress. She has no wheezes.  Neurological:  Alert, awake, and oriented x3. Speech and comprehension intact. PERRL bilaterally. EOMs intact bilaterally without nystagmus or subjective diplopia. Visual fields not assessed. No facial asymmetry. Tongue midline. Motor power symmetric proportional to effort. No pronator drift. Fine motor and coordination intact and symmetric. Gait not assessed. Romberg not assessed. Heel to toe not assessed. Distal pulses 2+ bilaterally.  Skin: Skin is warm and dry.  Right groin incision soft without active bleeding or hematoma.  Psychiatric: She has a normal mood and affect. Her behavior is normal. Judgment and thought content normal.  Nursing note and vitals reviewed.   Disposition: Discharge disposition: 01-Home or Self Care       Discharge  Instructions    Call MD for:  difficulty breathing, headache or visual disturbances   Complete by:  As directed    Call MD for:  extreme fatigue   Complete by:  As directed    Call MD for:  hives   Complete by:  As directed    Call MD for:  persistant dizziness or light-headedness   Complete by:  As directed    Call MD for:  persistant nausea and vomiting   Complete by:  As directed    Call MD for:  redness, tenderness, or signs of infection (pain, swelling, redness, odor or green/yellow discharge around incision site)   Complete by:  As directed    Call MD for:  severe uncontrolled pain   Complete by:  As directed    Call MD for:  temperature >100.4   Complete by:  As directed    Diet - low sodium heart healthy   Complete by:  As directed    Discharge instructions   Complete by:  As directed    Continue taking Plavix 75 mg once daily. Continue taking Aspirin 325 mg once daily. No bending, stooping, or lifting more than 10 pounds for 2 weeks. No driving self for 2 weeks. Stay hydrated by drinking plenty of water.   Increase activity slowly   Complete by:  As directed      Allergies as of 05/16/2018      Reactions   Clams [shellfish Allergy] Swelling   SWELLING REACTION UNSPECIFIED    Augmentin [amoxicillin-pot Clavulanate] Nausea And Vomiting   Has patient had a PCN reaction causing immediate rash, facial/tongue/throat swelling, SOB or lightheadedness with hypotension: No Has patient had a PCN reaction causing severe rash involving mucus membranes or skin necrosis: No Has patient had a PCN reaction that required hospitalization: No Has patient had a PCN reaction occurring within the last 10 years: Yes If all of the above answers are "NO", then may proceed with Cephalosporin use.   Biaxin [clarithromycin] Nausea And Vomiting      Medication List    STOP taking these medications   topiramate 50 MG tablet Commonly known as:  TOPAMAX     TAKE these medications     ARTIFICIAL TEAR SOLUTION OP Place 1 drop into both eyes daily as needed (dry eyes).   aspirin EC 325 MG tablet Take 325 mg by mouth at bedtime.   atorvastatin 40 MG tablet Commonly known as:  LIPITOR Take 1 tablet (40 mg total) by mouth daily. What changed:  when to take this   CALCIUM 600+D 600-800 MG-UNIT Tabs Generic drug:  Calcium Carb-Cholecalciferol Take 1 tablet by mouth at bedtime.   cetirizine 10 MG tablet Commonly known as:  ZYRTEC Take 10 mg by mouth at bedtime.   cholecalciferol 1000 units tablet Commonly known as:  VITAMIN D Take 1,000 Units by mouth 2 (two) times daily.   clopidogrel 75 MG tablet Commonly known as:  PLAVIX Take 75 mg by mouth at bedtime.   Magnesium 500 MG Caps Take 500 mg by mouth at bedtime.   Melatonin 5 MG Caps Take 5 mg by mouth at bedtime.   MOVE FREE JOINT HEALTH ADVANCE PO Take 1 tablet by mouth at bedtime.   propranolol ER 80 MG 24 hr capsule Commonly known as:  INDERAL LA Take 1 capsule (80 mg total) by mouth daily.   ranitidine 150 MG tablet Commonly known as:  ZANTAC Take 150 mg by mouth at bedtime.   sodium chloride 0.65 % Soln nasal spray Commonly known as:  OCEAN Place 1 spray into both nostrils as needed for congestion.   SUMAtriptan 100 MG tablet Commonly known as:  IMITREX Take 1 tablet (100 mg total) by mouth once. May repeat in 2 hours if headache persists or recurs. Not to exceed 2 per day.   SYNTHROID 112 MCG tablet Generic drug:  levothyroxine Take 112 mcg by mouth daily.   zolpidem  5 MG tablet Commonly known as:  AMBIEN Take 5 mg at bedtime by mouth.      Follow-up Information    Luanne Bras, MD Follow up in 2 week(s).   Specialties:  Interventional Radiology, Radiology Why:  Please follow-up with Dr. Estanislado Pandy in clinic 2 weeks after discharge. Contact information: [place] [address] [phone]            Electronically Signed: Earley Abide,  PA-C 05/16/2018, 8:58 AM   I have spent Less Than 30 Minutes discharging Amanda Fry.

## 2018-05-16 NOTE — Discharge Instructions (Signed)
Carotid Angioplasty With Stent, Care After Refer to this sheet in the next few weeks. These instructions provide you with information about caring for yourself after your procedure. Your health care provider may also give you more specific instructions. Your treatment has been planned according to current medical practices, but problems sometimes occur. Call your health care provider if you have any problems or questions after your procedure. What can I expect after the procedure? After the procedure, it is common to have:  Bruising at the catheter site. This usually fades within 1-2 weeks.  A small amount of blood or clear fluid coming from your surgical cut (incision).  Blood collecting underneath your skin (hematoma) around the catheter site. This may form a lump that you can see and feel. The lump may be sore and tender. This usually lasts for 1-2 weeks.  Follow these instructions at home: Medicines  Take over-the-counter and prescription medicines only as told by your health care provider.  If you were prescribed an antibiotic medicine, take it as told by your health care provider. Do not stop taking the antibiotic even if you start to feel better. Incision care   Keep your incision area clean and dry.  Follow instructions from your health care provider about how to take care of your incision. Make sure you: ? Wash your hands with soap and water before you change your bandage (dressing). If soap and water are not available, use hand sanitizer. ? Change your dressing as told by your health care provider. ? Leave stitches (sutures), skin glue, or adhesive strips in place. These skin closures may need to stay in place for 2 weeks or longer. If adhesive strip edges start to loosen and curl up, you may trim the loose edges. Do not remove adhesive strips completely unless your health care provider tells you to do that.  Check your incision area every day for signs of infection. Check  for: ? More redness, swelling, or pain. ? More fluid or blood. ? Warmth. ? Pus or a bad smell. Activity  Return to your normal activities as told by your health care provider. Ask your health care provider what activities are safe for you.  Do not lift anything that is heavier than 10 lb (4.5 kg) until your health care provider says that you can do this.  Avoid sexual activity until your health care provider says that this is safe for you.  Exercise regularly, as told by your health care provider. Ask your health care provider what types of exercise are safe for you. Eating and drinking   Follow instructions from your health care provider about eating or drinking restrictions.  Drink enough fluid to keep your urine clear or pale yellow.  Eat a heart-healthy diet. This should include plenty of fresh fruits and vegetables. Meat should be lean cuts. Avoid foods that are: ? High in salt, saturated fat, or sugar. ? Canned or highly processed. ? Fried. Lifestyle  Limit alcohol intake to no more than 1 drink per day for nonpregnant women and 2 drinks per day for men. One drink equals 12 oz of beer, 5 oz of wine, or 1 oz of hard liquor.  Do not use any tobacco products, such as cigarettes, chewing tobacco, or e-cigarettes. If you need help quitting, ask your health care provider.  Work with your health care provider to keep your blood pressure under control.  Maintain a healthy weight. General instructions  Do not drive or operate heavy machinery while  taking prescription pain medicine.  Do not take baths, swim, or use a hot tub until your health care provider approves.  Tell all health care providers who care for you that you have a stent.  Keep all follow-up visits as told by your health care provider. This is important. Contact a health care provider if:  You have more redness, swelling, or pain around your incision.  You have more fluid or blood coming from your  incision.  Your incision area feels warm to the touch.  You have pus or a bad smell coming from your incision.  You have a lump caused by a hematoma that does not go away after 2 weeks.  You have a fever. Get help right away if:  You have vision changes or loss of vision.  You have numbness or weakness on one side of your body.  You have difficulty talking.  You have slurred speech or you cannot speak (aphasia).  You suddenly feel very confused.  You notice a hematoma that is quickly getting larger (expanding).  You suddenly develop pain in the area where your stent was placed.  Your incision begins to bleed and does not stop after you hold pressure on it for 30 minutes. These symptoms may be an emergency. Do not wait to see if the symptoms will go away. Get medical help right away. Call your local emergency services (911 in the [place] Do not drive yourself to the hospital. This information is not intended to replace advice given to you by your health care provider. Make sure you discuss any questions you have with your health care provider. Document Released: 12/02/2005 Document Revised: 02/18/2016 Document Reviewed: 06/07/2015 [employer] Interactive Patient Education  [employer].

## 2018-05-16 NOTE — Plan of Care (Signed)
Pt adequate for d/c, pt walked around unit, IV removed, pt/family given AVS summary and discharge instructions. Pt understands limitations and care for groin incision.

## 2018-05-17 ENCOUNTER — Other Ambulatory Visit (HOSPITAL_COMMUNITY): Payer: Self-pay | Admitting: Interventional Radiology

## 2018-05-17 DIAGNOSIS — I6522 Occlusion and stenosis of left carotid artery: Secondary | ICD-10-CM

## 2018-05-18 ENCOUNTER — Encounter (HOSPITAL_COMMUNITY): Payer: Self-pay | Admitting: Interventional Radiology

## 2018-05-30 ENCOUNTER — Telehealth (HOSPITAL_COMMUNITY): Payer: Self-pay

## 2018-05-30 ENCOUNTER — Ambulatory Visit (HOSPITAL_COMMUNITY)
Admission: RE | Admit: 2018-05-30 | Discharge: 2018-05-30 | Disposition: A | Discharge status: Home / Self Care | Payer: Medicare Other | Source: Ambulatory Visit | Attending: Interventional Radiology | Admitting: Interventional Radiology

## 2018-05-30 ENCOUNTER — Other Ambulatory Visit (HOSPITAL_COMMUNITY): Payer: Self-pay | Admitting: Interventional Radiology

## 2018-05-30 DIAGNOSIS — I6523 Occlusion and stenosis of bilateral carotid arteries: Secondary | ICD-10-CM | POA: Insufficient documentation

## 2018-05-30 DIAGNOSIS — M797 Fibromyalgia: Secondary | ICD-10-CM | POA: Diagnosis not present

## 2018-05-30 DIAGNOSIS — Z87891 Personal history of nicotine dependence: Secondary | ICD-10-CM | POA: Insufficient documentation

## 2018-05-30 DIAGNOSIS — K219 Gastro-esophageal reflux disease without esophagitis: Secondary | ICD-10-CM | POA: Insufficient documentation

## 2018-05-30 DIAGNOSIS — I6522 Occlusion and stenosis of left carotid artery: Secondary | ICD-10-CM

## 2018-05-30 DIAGNOSIS — I729 Aneurysm of unspecified site: Secondary | ICD-10-CM

## 2018-05-30 DIAGNOSIS — Z880 Allergy status to penicillin: Secondary | ICD-10-CM | POA: Diagnosis not present

## 2018-05-30 DIAGNOSIS — Z7902 Long term (current) use of antithrombotics/antiplatelets: Secondary | ICD-10-CM | POA: Insufficient documentation

## 2018-05-30 DIAGNOSIS — G4700 Insomnia, unspecified: Secondary | ICD-10-CM | POA: Diagnosis not present

## 2018-05-30 DIAGNOSIS — Z09 Encounter for follow-up examination after completed treatment for conditions other than malignant neoplasm: Secondary | ICD-10-CM | POA: Insufficient documentation

## 2018-05-30 DIAGNOSIS — I6529 Occlusion and stenosis of unspecified carotid artery: Secondary | ICD-10-CM | POA: Diagnosis not present

## 2018-05-30 DIAGNOSIS — F419 Anxiety disorder, unspecified: Secondary | ICD-10-CM | POA: Diagnosis not present

## 2018-05-30 DIAGNOSIS — E785 Hyperlipidemia, unspecified: Secondary | ICD-10-CM | POA: Insufficient documentation

## 2018-05-30 DIAGNOSIS — Z8774 Personal history of (corrected) congenital malformations of heart and circulatory system: Secondary | ICD-10-CM | POA: Insufficient documentation

## 2018-05-30 DIAGNOSIS — I10 Essential (primary) hypertension: Secondary | ICD-10-CM | POA: Diagnosis not present

## 2018-05-30 DIAGNOSIS — E039 Hypothyroidism, unspecified: Secondary | ICD-10-CM | POA: Insufficient documentation

## 2018-05-30 DIAGNOSIS — Z7982 Long term (current) use of aspirin: Secondary | ICD-10-CM | POA: Insufficient documentation

## 2018-05-30 DIAGNOSIS — Z8249 Family history of ischemic heart disease and other diseases of the circulatory system: Secondary | ICD-10-CM | POA: Diagnosis not present

## 2018-05-30 LAB — PLATELET INHIBITION P2Y12: PLATELET FUNCTION P2Y12: 161 [PRU] — AB (ref 194–418)

## 2018-05-30 NOTE — Telephone Encounter (Signed)
Called pt to inform her that Dr. Estanislado Pandy and Radonna Ricker reviewed her p2y12 results which were good and there was no need to make any changes. 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 AW

## 2018-05-30 NOTE — Progress Notes (Signed)
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  Referring Physician(s): Tat, Eustace Quail  Supervising Physician: Luanne Bras  Patient Status: [facility] - Out-pt  History of Present Illness: Amanda Fry is a 67 y.o. female with a past medical history of hypertension, hyperlipidemia, hypothyroidism, GERD, kidney cyst, fibromyalgia, carpal tunnel syndrome, allergic rhinitis, insomnia, and anxiety. She was referred to Dr. Estanislado Pandy by Dr. Carles Collet for management of an incidental finding of an ACOM aneurysm. She underwent a diagnostic cerebral angiogram with Dr. Estanislado Pandy 05/10/2018. At that time, it was discovered that she had proximal left ICA stenosis. She decided to move forward with treatment and underwent a cerebral angiogram with revascularization of her proximal left ICA stenosis using stent assisted angioplasty 05/15/2018 with Dr. Estanislado Pandy. She was discharged home on stable condition on 05/16/2018.  Patient presents today for follow-up regarding her recent procedure 05/16/2018 and to discuss management for her ACOM aneurysm. Patient awake and alert sitting in chair with no complaints at this time. Accompanied by husband. Denies headache, weakness, numbness/tingling, dizziness, vision changes, hearing changes, tinnitus, or speech difficulty.  Patient is currently taking Plavix 75 mg once daily and Aspirin 325 mg once daily.   Past Medical History:  Diagnosis Date  . Allergic rhinitis   . Anterior communicating artery aneurysm   . Anxiety   . Carpal tunnel syndrome   . Cyst of kidney, acquired   . Esophageal reflux   . Fibromyalgia   . GERD (gastroesophageal reflux disease)   . Headache   . HLD (hyperlipidemia)   . Hypertension   . Hypothyroidism   . Insomnia   . PONV (postoperative nausea and vomiting)     Past Surgical History:  Procedure Laterality Date  . APPENDECTOMY  2001  . BREAST  LUMPECTOMY Left 1994  . CESAREAN SECTION    . IR ANGIO INTRA EXTRACRAN SEL COM CAROTID INNOMINATE BILAT MOD SED  05/10/2018  . IR ANGIO VERTEBRAL SEL VERTEBRAL BILAT MOD SED  05/10/2018  . IR INTRAVSC STENT CERV CAROTID W/EMB-PROT MOD SED INCL ANGIO  05/15/2018  . PARTIAL HYSTERECTOMY  1994  . RADIOLOGY WITH ANESTHESIA N/A 05/10/2018   Procedure: EMBOLIZATION;  Surgeon: Luanne Bras, MD;  Location: [facility];  Service: Radiology;  Laterality: N/A;  . RADIOLOGY WITH ANESTHESIA N/A 05/15/2018   Procedure: Angioplasty with stenting;  Surgeon: Luanne Bras, MD;  Location: [facility];  Service: Radiology;  Laterality: N/A;  . TONSILLECTOMY      Allergies: Clams [shellfish allergy]; Augmentin [amoxicillin-pot clavulanate]; and Biaxin [clarithromycin]  Medications: Prior to Admission medications   Medication Sig Start Date End Date Taking? Authorizing Provider  ARTIFICIAL TEAR SOLUTION OP Place 1 drop into both eyes daily as needed (dry eyes).    [provider]  aspirin EC 325 MG tablet Take 325 mg by mouth at bedtime.    [provider]  atorvastatin (LIPITOR) 40 MG tablet Take 1 tablet (40 mg total) by mouth daily. Patient taking differently: Take 40 mg by mouth at bedtime.  08/15/17 05/15/18  Josue Hector, MD  Calcium Carb-Cholecalciferol (CALCIUM 600+D) 600-800 MG-UNIT TABS Take 1 tablet by mouth at bedtime.    [provider]  cetirizine (ZYRTEC) 10 MG tablet Take 10 mg by mouth at bedtime.     [provider]  cholecalciferol (VITAMIN D) 1000 units tablet Take 1,000 Units by mouth 2 (two) times daily.    [provider]  clopidogrel (PLAVIX) 75  MG tablet Take 75 mg by mouth at bedtime.    [provider]  Glucos-Chond-Hyal Ac-Ca Fructo (MOVE FREE JOINT HEALTH ADVANCE PO) Take 1 tablet by mouth at bedtime.     [provider]  Magnesium 500 MG CAPS Take 500 mg by mouth at bedtime.     [provider]  Melatonin 5 MG  CAPS Take 5 mg by mouth at bedtime.    [provider]  propranolol ER (INDERAL LA) 80 MG 24 hr capsule Take 1 capsule (80 mg total) by mouth daily. 01/24/17   Tat, Eustace Quail, DO  ranitidine (ZANTAC) 150 MG tablet Take 150 mg by mouth at bedtime.     [provider]  sodium chloride (OCEAN) 0.65 % SOLN nasal spray Place 1 spray into both nostrils as needed for congestion.    [provider]  SUMAtriptan (IMITREX) 100 MG tablet Take 1 tablet (100 mg total) by mouth once. May repeat in 2 hours if headache persists or recurs. Not to exceed 2 per day. 03/16/16   Tat, Eustace Quail, DO  SYNTHROID 112 MCG tablet Take 112 mcg by mouth daily. 01/18/17   [provider]  zolpidem (AMBIEN) 5 MG tablet Take 5 mg at bedtime by mouth.    [provider]     Family History  Problem Relation Age of Onset  . CAD Father 54       CABG  . Cancer Mother        BREAST  . Healthy Brother   . Healthy Brother   . Diabetes Sister   . Cancer Sister        BREAST  . Healthy Son   . Healthy Daughter   . Heart disease Maternal Grandmother   . Heart disease Maternal Grandfather   . Emphysema Paternal Grandfather     Social History   Socioeconomic History  . Marital status: Married    Spouse name: Not on file  . Number of children: Not on file  . Years of education: Not on file  . Highest education level: Not on file  Occupational History  . Not on file  Social Needs  . Financial resource strain: Not on file  . Food insecurity:    Worry: Not on file    Inability: Not on file  . Transportation needs:    Medical: Not on file    Non-medical: Not on file  Tobacco Use  . Smoking status: Former Smoker    Packs/day: 0.50    Years: 10.00    Pack years: 5.00    Types: Cigarettes    Last attempt to quit: 09/26/2009    Years since quitting: 8.6  . Smokeless tobacco: Never Used  Substance and Sexual Activity  . Alcohol use: Yes    Alcohol/week: 7.0 standard drinks     Types: 7 Glasses of wine per week  . Drug use: No  . Sexual activity: Not on file  Lifestyle  . Physical activity:    Days per week: Not on file    Minutes per session: Not on file  . Stress: Not on file  Relationships  . Social connections:    Talks on phone: Not on file    Gets together: Not on file    Attends religious service: Not on file    Active member of club or organization: Not on file    Attends meetings of clubs or organizations: Not on file    Relationship status: Not on  file  Other Topics Concern  . Not on file  Social History Narrative  . Not on file     Review of Systems: A 12 point ROS discussed and pertinent positives are indicated in the HPI above.  All other systems are negative.  Review of Systems  Constitutional: Negative for chills and fever.  HENT: Negative for hearing loss and tinnitus.   Eyes: Negative for visual disturbance.  Respiratory: Negative for shortness of breath and wheezing.   Cardiovascular: Negative for chest pain and palpitations.  Neurological: Negative for dizziness, speech difficulty, weakness, numbness and headaches.  Psychiatric/Behavioral: Negative for behavioral problems and confusion.    Vital Signs: There were no vitals taken for this visit.  Physical Exam  Constitutional: She is oriented to person, place, and time. She appears well-developed and well-nourished. No distress.  Cardiovascular: Normal rate, regular rhythm and normal heart sounds.  No murmur heard. Pulmonary/Chest: Effort normal and breath sounds normal. No respiratory distress. She has no wheezes.  Neurological: She is alert and oriented to person, place, and time.  Skin: Skin is warm and dry.  Psychiatric: She has a normal mood and affect. Her behavior is normal. Judgment and thought content normal.  Nursing note and vitals reviewed.    Imaging: Ir Sherre Lain Stent Cerv Carotid W/emb-prot Mod Sed  Result Date: 05/18/2018 CLINICAL DATA:  Patient with  anterior communicating region aneurysm. Discovery of ulcerated left internal carotid artery proximal ulcerated plaque associated with a 65% stenosis by the NASCET criteria. Endovascular revascularization of the high grade stenosis of the left internal carotid artery prior to endovascular treatment of the anterior communicating artery region aneurysm. EXAM: INTRAVSC STENT CERV CAROTID W/ EMB-PROT COMPARISON:  Diagnostic catheter arteriogram 05/10/2018. MEDICATIONS: Heparin 1000 units IV; no antibiotic was administered within 1 hour of the procedure. ANESTHESIA/SEDATION: Mac anesthesia as per the Department of Anesthesiology at [facility]:  Isovue 300 approximately 60 mL FLUOROSCOPY TIME:  Fluoroscopy Time: 27 minutes 48 seconds (1284 mGy). COMPLICATIONS: None immediate. TECHNIQUE: Informed written consent was obtained from the patient after a thorough discussion of the procedural risks, benefits and alternatives. All questions were addressed. Maximal Sterile Barrier Technique was utilized including caps, mask, sterile gowns, sterile gloves, sterile drape, hand hygiene and skin antiseptic. A timeout was performed prior to the initiation of the procedure. The right groin was prepped and draped in the usual sterile fashion. Thereafter using modified Seldinger technique, transfemoral access into the right common femoral artery was obtained without difficulty. Over a 0.035 inch guidewire, a 5 French Pinnacle sheath was inserted. Through this, and also over 0.035 inch guidewire, a 5 French Simmons 2 catheter was advanced to the aortic arch region and selectively positioned in the left common carotid artery. FINDINGS: The left common carotid arteriogram again demonstrates the left external carotid artery and its major branches to be widely patent. The left internal carotid artery proximally just distal to the bulb again demonstrates a high-grade stenosis associated with a prominent ulcerated plaque. More  distally, the left internal carotid artery is seen to opacify widely to the cranial skull base. The petrous, cavernous and supraclinoid segments are widely patent. The left middle cerebral artery and the left anterior cerebral artery opacify into the capillary and venous phases. Again noted is prompt opacification via the anterior communicating artery of the right anterior cerebral A2 segment and distally. Arising in the anterior communicating artery along the inferior aspect again noted is an approximately 3 mm saccular aneurysm projecting inferiorly and slightly  anteriorly. ENDOVASCULAR REVASCULARIZATION OF THE LEFT INTERNAL CAROTID ARTERY PROXIMALLY The diagnostic Simmons 2 guide catheter in the left common carotid artery was exchanged over a 0.035 inch 300 cm Rosen exchange guidewire for a 6 French 80 cm Cook shuttle sheath using biplane roadmap technique and constant fluoroscopic guidance. Good aspiration obtained from the hub of the [facility] shuttle sheath. A gentle contrast injection demonstrated no evidence of spasms, dissections or of intraluminal filling defects. A control arteriogram performed centered intracranially demonstrated no change in the intracranial circulation. No evidence of filling defects or of intracranial stenosis was noted. Measurements were then performed of the left internal carotid artery just distal to the high-grade stenosis associated with an ulcerated plaque, and also the distal left common carotid artery. The landing zones of the stent were then defined. At this time, a 4/7 mm Emboshield NAV device was prepped and purged with heparinized saline infusion in its housing. After having cleared the bubbles from the region of the filter, this was captured into the filter delivery device system. The combination of the 0.014 inch guidewire, with the filter delivery device was then retrieved. A J configuration was then given to the distal wire. Thereafter using biplane roadmap technique and  constant fluoroscopic guidance, in a coaxial manner and with constant heparinized saline infusion, the combination was navigated without difficulty using the rapid exchange technique to just distal to the [employer] sheath. Thereafter using biplane roadmap technique and constant fluoroscopic guidance, and a torque device, the 0.014 inch micro guidewire was then advanced without difficulty through the high-grade stenosis followed by the filter delivery microcatheter. This combination was then advanced to the cervical petrous junction. Thereafter, the filter device was then deployed by retrieving the filter delivery microcatheter under constant observation. With the filter deployed, the delivery microcatheter was then gently retrieved under constant fluoroscopic guidance maintaining safe positioning of the distal filter. A control arteriogram performed through the [facility] shuttle sheath in the left common carotid artery demonstrated no change in the intracranial circulation. No spasm was noted in the left internal carotid artery at the cranial skull base. At this time, an Xact 9/7 mm x 40 mm stent system was retrogradely flushed with heparinized saline infusion. Thereafter using the rapid exchange technique, this delivery system of the stent was advanced and positioned across the high-grade stenosis in the left internal carotid artery proximally. The proximal and landing zones were then defined. Thereafter, the stent was deployed in the usual manner without any difficulty. The stent delivery catheter was then retrieved and removed ensuring no change in the position of the filter device at the cranial skull base. A control arteriogram performed through the [facility] shuttle sheath demonstrated excellent apposition proximally and distally with no opacification of the ulcerated plaque. There was significantly improved caliber. However, there remained a waist at the site of the previously noted high-grade stenosis. This  prompted a post stent placement angioplasty using a 5 mm x 30 mm Viatrac 014 balloon system which was advanced again in a coaxial manner with constant heparinized saline infusion using biplane roadmap technique and constant fluoroscopic guidance using the rapid exchange technique. The balloon was then gently inflated to its normal pressure of 8 atmospheres maintained for approximately 20 seconds. During this time, no evidence of bradycardia or of hemodynamic changes were seen. The balloon was then deflated and retrieved and removed. A control arteriogram performed immediately demonstrated significantly improved caliber and flow through the stented segment, and also intracranially. No evidence of intraluminal filling defects  or of occlusion was seen. Over the filter wire, a filter capture device which had been prepped retrogradely with heparinized saline infusion was advanced again using the rapid exchange technique to the proximal portion of the filter device. The filter device was then captured by pulling on the filter micro guidewire and capturing it within the capture device. This combination was then gently retrieved and removed. Control arteriograms were then performed immediately, and at 15 minutes, and approximately 40 minutes post deployment of the device. These continued to demonstrate excellent flow through the stented segment and also intracranially. No angiographic evidence of intra stent filling defects or of irregularities or spasm was seen extra cranially. At the end of the 4 minutes, the 6 [place] Cook shuttle sheath was then retrieved into the abdominal aorta and exchanged over a J-tip guidewire for a 6 [place] Pinnacle sheath. This in turn was then successfully removed with hemostasis achieved manually at the right groin puncture site. No evidence of hematoma, or of bleeding was seen. Distal pulses remained palpable in the dorsalis pedis arteries bilaterally, and Dopplerable in the posterior tibial  regions unchanged bilaterally. During the procedure, the patient's neurological status remained completely stable. The patient did not complain of any speech difficulties, or of right-sided weakness on examination. The patient's ACT was maintained in the region of approximately 200 seconds throughout the procedure. The patient was given approximately 5 mg of protamine sulfate prior to removal of the sheath to partially reverse the effects of IV heparin. The patient was then transferred to the neuro ICU for overnight observations on IV heparin, and also close neurologic observations, including her blood pressure. The overnight stay was uneventful. The patient was able to tolerate clear liquids toward the end of the evening. The following morning, the patient's IV heparin was stopped and the patient was switched to aspirin 325 mg a day, and Plavix 75 mg a day. Neurologically she remained stable without any focal deficits. She was able to ambulate without difficulty, and tolerate normal meals prior to discharge home under the care of her family. She was advised to refrain from stooping, bending or lifting weights above 10 pounds for 2 weeks. She was advised to maintain adequate hydration by drinking tap water. Additionally she was advised to refrain from driving for at least a couple of weeks. Should she develop any stroke-like symptoms she was asked to call 911. Questions were answered to her satisfaction. She leaves with good understanding and agreement with the above management plan. The patient will be followed in the clinic in about 2 weeks time. IMPRESSION: Status post endovascular revascularization of high-grade stenosis of the left internal carotid artery proximally associated with large ulcerated plaque, with distal protection. PLAN: Return to clinic in 2 weeks time to discuss management of intracranial aneurysm. Electronically Signed   By: Luanne Bras M.D.   On: 05/16/2018 09:26   Ir Angio Intra  Extracran Sel Com Carotid Innominate Bilat Mod Sed  Result Date: 05/14/2018 CLINICAL DATA:  History of headaches. Discovery of intracranial aneurysm. EXAM: BILATERAL COMMON CAROTID AND INNOMINATE ANGIOGRAPHY; IR ANGIO VERTEBRAL SEL VERTEBRAL BILAT MOD SED COMPARISON:  Recent MRI and MRA of the brain performed at an outside institution. MEDICATIONS: Heparin 1000 units IV; vancomycin 1 g IV antibiotic was administered within 1 hour of the procedure. ANESTHESIA/SEDATION: Mac anesthesia as per the Department of Anesthesiology at [place]:  Isovue 300 approximately 65 cc. FLUOROSCOPY TIME:  Fluoroscopy Time: 14 minutes 18 seconds (090 mGy). COMPLICATIONS: None  immediate. TECHNIQUE: Informed written consent was obtained from the patient after a thorough discussion of the procedural risks, benefits and alternatives. All questions were addressed. Maximal Sterile Barrier Technique was utilized including caps, mask, sterile gowns, sterile gloves, sterile drape, hand hygiene and skin antiseptic. A timeout was performed prior to the initiation of the procedure. The right groin was prepped and draped in the usual sterile fashion. Thereafter using modified Seldinger technique, transfemoral access into the right common femoral artery was obtained without difficulty. Over a 0.035 inch guidewire, a 5 French Pinnacle sheath was inserted. Through this, and also over 0.035 inch guidewire, a 5 [place] JB 1 catheter was advanced to the aortic arch region and selectively positioned in the right common carotid artery, the right vertebral artery, the left common carotid artery and the left vertebral artery. FINDINGS: The right vertebral artery origin is widely patent. The vessel is seen to opacify normally to the cranial skull base. Wide patency is seen of the right vertebrobasilar junction and the right posterior-inferior cerebellar artery. The opacified portion of the basilar artery, the left posterior cerebral  artery, the superior cerebellar arteries and the anterior-inferior cerebellar arteries is normal into the capillary and venous phases. Inflow of unopacified blood is seen in the basilar artery from the contralateral vertebral artery. Nonvisualization of the right posterior cerebral artery is secondary to the dominant right posterior communicating artery opacifying from the right internal carotid artery as described below. The right common carotid arteriogram demonstrates the right external carotid artery and its major branches to be widely patent. The right internal carotid artery just distal to the bulb has approximately 30% stenosis as per NASCET criteria. No acute ulcerations or filling defects are seen. More distally, the right internal carotid artery is seen to opacify normally to the cranial skull base. The petrous, cavernous and the supraclinoid segments are widely patent. Rising from the petrous cavernous junction of the right internal carotid artery is a wide neck saccular outpouching measuring approximately 3 mm. The right posterior communicating artery is seen opacifying the right posterior cerebral artery distribution. The right middle cerebral artery opacifies normally into the capillary and venous phases. There is nonvisualization of the right anterior cerebral artery A1 segment. The origin of the left vertebral artery is widely patent. The vessel is seen to opacify normally to the cranial skull base. Wide patency is seen of the left posterior-inferior cerebellar artery and the left vertebrobasilar junction. The basilar artery, the left posterior cerebral artery, the superior cerebellar arteries and the anterior-inferior cerebellar arteries opacify into the capillary and venous phases. A mild focal area of stenosis is seen of the proximal basilar artery just distal to the confluence of the vertebrobasilar junctions. The left common carotid arteriogram demonstrates the left external carotid artery and  its major branches to be widely patent. The left internal carotid artery just distal to the bulb has an irregular plaque associated with an approximately 65% stenosis distally. Medial to this is an approximately 5.8 mm x 4 mm saccular entity probably representing an ulcerated plaque. Distal to this the left internal carotid artery is seen to opacify widely to the cranial skull base. The petrous, cavernous and the supraclinoid segments are widely patent. The left middle cerebral artery and the left anterior cerebral artery opacify into the capillary and venous phases. Cross filling via the anterior communicating artery of the right anterior cerebral artery A2 segment and distally is noted. Arising in the anterior communicating artery region and projecting anterior inferiorly is a  saccular aneurysm measuring approximately 3.1 mm x 2.8 mm. 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 Approximately 65% stenosis of the left internal carotid artery proximally associated with a saccular entity along its medial wall most likely representing an ulcerated plaque, measuring 5.8 mm x 4 mm. Approximately 30% stenosis of the right internal carotid artery proximally by the NASCET criteria. Approximately 3 mm wide-based aneurysmal outpouching arising from the inferior aspect of the petrous cavernous right ICA. PLAN: Findings of the angiogram were reviewed with patient and the patient's husband and daughter. Given the ulcerated lesion associated with an approximately 65% stenosis of the left internal carotid proximally, endovascular treatment of the anterior communicating artery region aneurysm through this lesion could result in intracranial complications of thromboembolic stroke. Therefore, prior to the endovascular treatment of the anterior communicating artery region aneurysm, revascularization with stability at the site of the stenosis of the  left internal carotid artery due to the ulcerated plaque would require treatment. This was explained to the patient and the family. The patient will be left on aspirin 325 mg, and Plavix 75 mg a day. 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 Questions were answered to their satisfaction. The patient would like to proceed with the above management plan. Electronically Signed   By: Luanne Bras M.D.   On: 05/10/2018 11:10   Ir Angio Vertebral Sel Vertebral Bilat Mod Sed  Result Date: 05/14/2018 CLINICAL DATA:  History of headaches. Discovery of intracranial aneurysm. EXAM: BILATERAL COMMON CAROTID AND INNOMINATE ANGIOGRAPHY; IR ANGIO VERTEBRAL SEL VERTEBRAL BILAT MOD SED COMPARISON:  Recent MRI and MRA of the brain performed at an outside institution. MEDICATIONS: Heparin 1000 units IV; vancomycin 1 g IV antibiotic was administered within 1 hour of the procedure. ANESTHESIA/SEDATION: Mac anesthesia as per the Department of Anesthesiology at [place]:  Isovue 300 approximately 65 cc. FLUOROSCOPY TIME:  Fluoroscopy Time: 14 minutes 18 seconds (090 mGy). COMPLICATIONS: None immediate. TECHNIQUE: Informed written consent was obtained from the patient after a thorough discussion of the procedural risks, benefits and alternatives. All questions were addressed. Maximal Sterile Barrier Technique was utilized including caps, mask, sterile gowns, sterile gloves, sterile drape, hand hygiene and skin antiseptic. A timeout was performed prior to the initiation of the procedure. The right groin was prepped and draped in the usual sterile fashion. Thereafter using modified Seldinger technique, transfemoral access into the right common femoral artery was obtained without difficulty. Over a 0.035 inch guidewire, a 5  French Pinnacle sheath was inserted. Through this, and also over 0.035 inch guidewire, a 5 [place] JB 1 catheter was advanced to the aortic arch region and selectively positioned in the right common carotid artery, the right vertebral artery, the left common carotid artery and the left vertebral artery. FINDINGS: The right vertebral artery origin is widely patent. The vessel is seen to opacify normally to the cranial skull base. Wide patency is seen of the right vertebrobasilar junction and the right posterior-inferior cerebellar artery. The opacified portion of the basilar artery, the left posterior cerebral artery, the superior cerebellar arteries and the anterior-inferior cerebellar arteries is normal into the capillary and venous phases. Inflow of unopacified blood is seen in the basilar artery from the contralateral vertebral artery. Nonvisualization of the right posterior cerebral artery is secondary to  the dominant right posterior communicating artery opacifying from the right internal carotid artery as described below. The right common carotid arteriogram demonstrates the right external carotid artery and its major branches to be widely patent. The right internal carotid artery just distal to the bulb has approximately 30% stenosis as per NASCET criteria. No acute ulcerations or filling defects are seen. More distally, the right internal carotid artery is seen to opacify normally to the cranial skull base. The petrous, cavernous and the supraclinoid segments are widely patent. Rising from the petrous cavernous junction of the right internal carotid artery is a wide neck saccular outpouching measuring approximately 3 mm. The right posterior communicating artery is seen opacifying the right posterior cerebral artery distribution. The right middle cerebral artery opacifies normally into the capillary and venous phases. There is nonvisualization of the right anterior cerebral artery A1 segment. The origin of the  left vertebral artery is widely patent. The vessel is seen to opacify normally to the cranial skull base. Wide patency is seen of the left posterior-inferior cerebellar artery and the left vertebrobasilar junction. The basilar artery, the left posterior cerebral artery, the superior cerebellar arteries and the anterior-inferior cerebellar arteries opacify into the capillary and venous phases. A mild focal area of stenosis is seen of the proximal basilar artery just distal to the confluence of the vertebrobasilar junctions. The left common carotid arteriogram demonstrates the left external carotid artery and its major branches to be widely patent. The left internal carotid artery just distal to the bulb has an irregular plaque associated with an approximately 65% stenosis distally. Medial to this is an approximately 5.8 mm x 4 mm saccular entity probably representing an ulcerated plaque. Distal to this the left internal carotid artery is seen to opacify widely to the cranial skull base. The petrous, cavernous and the supraclinoid segments are widely patent. The left middle cerebral artery and the left anterior cerebral artery opacify into the capillary and venous phases. Cross filling via the anterior communicating artery of the right anterior cerebral artery A2 segment and distally is noted. Arising in the anterior communicating artery region and projecting anterior inferiorly is a saccular aneurysm measuring approximately 3.1 mm x 2.8 mm. 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 Approximately 65% stenosis of the left internal carotid artery proximally associated with a saccular entity along its medial wall most likely representing an ulcerated plaque, measuring 5.8 mm x 4 mm. Approximately 30% stenosis of the right internal carotid artery proximally by the NASCET criteria. Approximately 3 mm wide-based aneurysmal outpouching  arising from the inferior aspect of the petrous cavernous right ICA. PLAN: Findings of the angiogram were reviewed with patient and the patient's husband and daughter. Given the ulcerated lesion associated with an approximately 65% stenosis of the left internal carotid proximally, endovascular treatment of the anterior communicating artery region aneurysm through this lesion could result in intracranial complications of thromboembolic stroke. Therefore, prior to the endovascular treatment of the anterior communicating artery region aneurysm, revascularization with stability at the site of the stenosis of the left internal carotid artery due to the ulcerated plaque would require treatment. This was explained to the patient and the family. The patient will be left on aspirin 325 mg, and Plavix 75 mg a day. 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 Questions were answered to their satisfaction. The patient would like to proceed with the above management plan. Electronically Signed   By: Luanne Bras M.D.   On: 05/10/2018 11:10    Labs:  CBC: Recent Labs    05/07/18 1100 05/15/18 0707 05/16/18 1059  WBC 5.0 6.0 9.5  HGB 13.3 13.8 10.5*  HCT 42.1 42.1 32.7*  PLT 211 211 174    COAGS: Recent Labs    05/07/18 1100 05/15/18 0707  INR 1.06 1.05    BMP: Recent Labs    05/07/18 1100 05/15/18 0707 05/16/18 1059  NA 143 139 141  K 4.2 4.1 3.8  CL 108 111 115*  CO2 27 20* 21*  GLUCOSE 104* 147* 98  BUN _0 CALCIUM 9.5 9.3 8.5*  CREATININE 1.05* 1.24* 0.99  GFRNONAA 55* 45* 59*  GFRAA >60 52* >60    LIVER FUNCTION TESTS: Recent Labs    10/17/17 0808  BILITOT 0.2  AST 27  ALT 36*  ALKPHOS 59  PROT 6.2  ALBUMIN 4.3    TUMOR MARKERS: No results for input(s): AFPTM, CEA, CA199,  CHROMGRNA in the last 8760 hours.  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 Estanislado Pandy. ACOM aneurysm. Reviewed imaging with patient and husband. Brought to their attention was her proximal left ICA stenosis, both prior to and following intervention. Discussed patient's post-op. She states that she has been doing well since procedure with no complaints at this time. 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 Since patient's proximal left ICA stenosis has been treated, we can now focus on her ACOM aneurysm. Re-explained procedure, including risks and benefits. Patient still expresses desire to move forward with intervention for her ACOM aneurysm.  Plan for follow-up with an image-guided cerebral angiogram with possible embolization of ACOM aneurysm ASAP. Informed patient that our schedulers will call her to set up this procedure. Will order P2Y12 today to check effectiveness of Plavix use. Instructed patient to continue taking Plavix 75 mg once daily and Aspirin 325 mg once daily.  All questions answered and concerns addressed. Patient and husband convey understanding and agree with plan.  Thank you for this interesting consult.  I greatly enjoyed meeting KEYUNNA COCO and look forward to participating in their care.  A copy of this report was sent to the requesting provider on this date.  Electronically Signed: Earley Abide, PA-C 05/30/2018, 9:09 AM   I spent a total of 25 Minutes in face to face in clinical consultation, greater than 50% of which was counseling/coordinating care for proximal left ICA stenosis s/p revascularization AND ACOM aneurysm.

## 2018-06-05 ENCOUNTER — Other Ambulatory Visit: Payer: Self-pay | Admitting: Neurology

## 2018-06-20 ENCOUNTER — Other Ambulatory Visit: Payer: Self-pay | Admitting: Student

## 2018-07-02 DIAGNOSIS — B353 Tinea pedis: Secondary | ICD-10-CM | POA: Diagnosis not present

## 2018-07-02 DIAGNOSIS — M722 Plantar fascial fibromatosis: Secondary | ICD-10-CM | POA: Diagnosis not present

## 2018-07-02 DIAGNOSIS — M79672 Pain in left foot: Secondary | ICD-10-CM | POA: Diagnosis not present

## 2018-07-02 DIAGNOSIS — L602 Onychogryphosis: Secondary | ICD-10-CM | POA: Diagnosis not present

## 2018-07-02 DIAGNOSIS — B351 Tinea unguium: Secondary | ICD-10-CM | POA: Diagnosis not present

## 2018-07-02 NOTE — Pre-Procedure Instructions (Signed)
Brigett Estell Hunn  07/02/2018      [id number] - [address], [place] - [place] [address] Phone: [phone] Fax: [phone]    Your procedure is scheduled on October 16th.  Report to [facility] Admitting at [place].M.  Call this number if you have problems the morning of surgery:  [phone]   Remember:  Do not eat or drink after midnight.    Take these medicines the morning of surgery with A SIP OF WATER   Eye drops (if needed)  Zyrtec (if needed)  Synthroid   Keep taking you aspirin and plavix.  7 days prior to surgery STOP taking any Aleve, Naproxen, Ibuprofen, Motrin, Advil, Goody's, BC's, all herbal medications, fish oil, and all vitamins     Do not wear jewelry, make-up or nail polish.  Do not wear lotions, powders, or perfumes, or deodorant.  Do not shave 48 hours prior to surgery.  Men may shave face and neck.  Do not bring valuables to the hospital.  [facility] is not responsible for any belongings or valuables.  Contacts, dentures or bridgework may not be worn into surgery.  Leave your suitcase in the car.  After surgery it may be brought to your room.  For patients admitted to the hospital, discharge time will be determined by your treatment team.  Patients discharged the day of surgery will not be allowed to drive home.    [facility]- Preparing For Surgery  Before surgery, you can play an important role. Because skin is not sterile, your skin needs to be as free of germs as possible. You can reduce the number of germs on your skin by washing with CHG (chlorahexidine gluconate) Soap before surgery.  CHG is an antiseptic cleaner which kills germs and bonds with the skin to continue killing germs even after washing.    Oral Hygiene is also important to reduce your risk of infection.  Remember - BRUSH YOUR TEETH THE MORNING OF SURGERY WITH YOUR REGULAR TOOTHPASTE  Please do not use if you have  an allergy to CHG or antibacterial soaps. If your skin becomes reddened/irritated stop using the CHG.  Do not shave (including legs and underarms) for at least 48 hours prior to first CHG shower. It is OK to shave your face.  Please follow these instructions carefully.   1. Shower the NIGHT BEFORE SURGERY and the MORNING OF SURGERY with CHG.   2. If you chose to wash your hair, wash your hair first as usual with your normal shampoo.  3. After you shampoo, rinse your hair and body thoroughly to remove the shampoo.  4. Use CHG as you would any other liquid soap. You can apply CHG directly to the skin and wash gently with a scrungie or a clean washcloth.   5. Apply the CHG Soap to your body ONLY FROM THE NECK DOWN.  Do not use on open wounds or open sores. Avoid contact with your eyes, ears, mouth and genitals (private parts). Wash Face and genitals (private parts)  with your normal soap.  6. Wash thoroughly, paying special attention to the area where your surgery will be performed.  7. Thoroughly rinse your body with warm water from the neck down.  8. DO NOT shower/wash with your normal soap after using and rinsing off the CHG Soap.  9. Pat yourself dry with a CLEAN TOWEL.  10. Wear CLEAN PAJAMAS to bed the night before surgery,  wear comfortable clothes the morning of surgery  11. Place CLEAN SHEETS on your bed the night of your first shower and DO NOT SLEEP WITH PETS.    Day of Surgery:  Do not apply any deodorants/lotions.  Please wear clean clothes to the hospital/surgery center.   Remember to brush your teeth WITH YOUR REGULAR TOOTHPASTE.    Please read over the following fact sheets that you were given.

## 2018-07-03 ENCOUNTER — Encounter (HOSPITAL_COMMUNITY): Payer: Self-pay

## 2018-07-03 ENCOUNTER — Other Ambulatory Visit: Payer: Self-pay

## 2018-07-03 ENCOUNTER — Encounter (HOSPITAL_COMMUNITY)
Admission: RE | Admit: 2018-07-03 | Discharge: 2018-07-03 | Disposition: A | Discharge status: Home / Self Care | Payer: Medicare Other | Source: Ambulatory Visit | Attending: Interventional Radiology | Admitting: Interventional Radiology

## 2018-07-03 DIAGNOSIS — Z01812 Encounter for preprocedural laboratory examination: Secondary | ICD-10-CM | POA: Insufficient documentation

## 2018-07-03 HISTORY — DX: Family history of other specified conditions: Z84.89

## 2018-07-03 HISTORY — DX: Plantar fascial fibromatosis: M72.2

## 2018-07-03 LAB — CBC WITH DIFFERENTIAL/PLATELET
Abs Immature Granulocytes: 0.08 K/uL — ABNORMAL HIGH (ref 0.00–0.07)
Basophils Absolute: 0 K/uL (ref 0.0–0.1)
Basophils Relative: 0 %
Eosinophils Absolute: 0 K/uL (ref 0.0–0.5)
Eosinophils Relative: 0 %
HCT: 41.6 % (ref 36.0–46.0)
Hemoglobin: 13 g/dL (ref 12.0–15.0)
Immature Granulocytes: 1 %
Lymphocytes Relative: 6 %
Lymphs Abs: 0.8 K/uL (ref 0.7–4.0)
MCH: 29 pg (ref 26.0–34.0)
MCHC: 31.3 g/dL (ref 30.0–36.0)
MCV: 92.7 fL (ref 80.0–100.0)
Monocytes Absolute: 0.6 K/uL (ref 0.1–1.0)
Monocytes Relative: 4 %
Neutro Abs: 11.7 K/uL — ABNORMAL HIGH (ref 1.7–7.7)
Neutrophils Relative %: 89 %
Platelets: 259 K/uL (ref 150–400)
RBC: 4.49 MIL/uL (ref 3.87–5.11)
RDW: 11.8 % (ref 11.5–15.5)
WBC: 13.2 K/uL — ABNORMAL HIGH (ref 4.0–10.5)
nRBC: 0 % (ref 0.0–0.2)

## 2018-07-03 LAB — BASIC METABOLIC PANEL WITH GFR
Anion gap: 11 (ref 5–15)
BUN: 18 mg/dL (ref 8–23)
CO2: 19 mmol/L — ABNORMAL LOW (ref 22–32)
Calcium: 10 mg/dL (ref 8.9–10.3)
Chloride: 111 mmol/L (ref 98–111)
Creatinine, Ser: 0.95 mg/dL (ref 0.44–1.00)
GFR calc Af Amer: >60 mL/min (ref >60)
GFR calc non Af Amer: >60 mL/min (ref >60)
Glucose, Bld: 112 mg/dL — ABNORMAL HIGH (ref 70–99)
Potassium: 4.1 mmol/L (ref 3.5–5.1)
Sodium: 141 mmol/L (ref 135–145)

## 2018-07-03 LAB — PROTIME-INR
INR: 1.07
PROTHROMBIN TIME: 13.8 s (ref 11.4–15.2)

## 2018-07-03 LAB — APTT: aPTT: 29 s (ref 24–36)

## 2018-07-03 LAB — PLATELET INHIBITION P2Y12: Platelet Function  P2Y12: 161 [PRU] — ABNORMAL LOW (ref 194–418)

## 2018-07-03 NOTE — Progress Notes (Signed)
Inboxed MD of P2Y12 161

## 2018-07-03 NOTE — Progress Notes (Signed)
PCP - [facility] Cardiologist - Dr Johnsie Cancel  EKG - 08/15/17 Stress Test - 08/29/17  Pt to continue taking ASA and plavix, take normal dose Monday, take in Afternoon Tuesday and in AM prior to surgery on Wednesday. Pt will also be taking prednisone and Benadryl prior to surgery d/t allergy to shellfish. These instructions were given to pt per Dr. Estanislado Pandy.   Anesthesia review: anesthesia consult ordered by Dr. Estanislado Pandy.   Patient denies shortness of breath, fever, cough and chest pain at PAT appointment   Patient verbalized understanding of instructions that were given to them at the PAT appointment. Patient was also instructed that they will need to review over the PAT instructions again at home before surgery.

## 2018-07-04 ENCOUNTER — Telehealth: Payer: Self-pay | Admitting: Student

## 2018-07-04 NOTE — Progress Notes (Signed)
Received patient's P2Y12 result from 07/03/2018- 161 PRU. Patient is currently taking Plavix 75 mg once daily and Aspirin 325 mg once daily. Patient is scheduled for an image-guided cerebral angiogram with possible ACOM aneurysm embolization tentatively for 07/11/2018 with Dr. Estanislado Pandy.  Discussed case with Dr. Estanislado Pandy.  Per Dr. Estanislado Pandy- no indication to alter medications based on P2Y12 result. Patient to continue taking Plavix 75 mg once daily and Aspirin 325 mg once daily.  Bea Graff Louk, PA-C 07/04/2018, 11:20 AM

## 2018-07-09 DIAGNOSIS — B351 Tinea unguium: Secondary | ICD-10-CM | POA: Diagnosis not present

## 2018-07-10 ENCOUNTER — Other Ambulatory Visit: Payer: Self-pay | Admitting: Student

## 2018-07-11 ENCOUNTER — Ambulatory Visit (HOSPITAL_COMMUNITY)
Admission: RE | Admit: 2018-07-11 | Discharge: 2018-07-11 | Disposition: A | Discharge status: Home / Self Care | Payer: Medicare Other | Source: Ambulatory Visit | Attending: Interventional Radiology | Admitting: Interventional Radiology

## 2018-07-11 ENCOUNTER — Ambulatory Visit (HOSPITAL_COMMUNITY): Payer: Medicare Other | Admitting: Physician Assistant

## 2018-07-11 ENCOUNTER — Ambulatory Visit (HOSPITAL_COMMUNITY): Payer: Medicare Other

## 2018-07-11 ENCOUNTER — Inpatient Hospital Stay (HOSPITAL_COMMUNITY)
Admission: RE | Admit: 2018-07-11 | Discharge: 2018-07-12 | DRG: 027 | Disposition: A | Discharge status: Home / Self Care | Payer: Medicare Other | Attending: Interventional Radiology | Admitting: Interventional Radiology

## 2018-07-11 ENCOUNTER — Encounter (HOSPITAL_COMMUNITY)
Admission: RE | Disposition: A | Discharge status: Home / Self Care | Payer: Self-pay | Source: Home / Self Care | Attending: Interventional Radiology

## 2018-07-11 ENCOUNTER — Encounter (HOSPITAL_COMMUNITY): Payer: Self-pay

## 2018-07-11 ENCOUNTER — Other Ambulatory Visit: Payer: Self-pay

## 2018-07-11 DIAGNOSIS — Z79899 Other long term (current) drug therapy: Secondary | ICD-10-CM

## 2018-07-11 DIAGNOSIS — Z8249 Family history of ischemic heart disease and other diseases of the circulatory system: Secondary | ICD-10-CM

## 2018-07-11 DIAGNOSIS — Z87891 Personal history of nicotine dependence: Secondary | ICD-10-CM

## 2018-07-11 DIAGNOSIS — Z7982 Long term (current) use of aspirin: Secondary | ICD-10-CM

## 2018-07-11 DIAGNOSIS — I6502 Occlusion and stenosis of left vertebral artery: Secondary | ICD-10-CM | POA: Diagnosis present

## 2018-07-11 DIAGNOSIS — I729 Aneurysm of unspecified site: Secondary | ICD-10-CM

## 2018-07-11 DIAGNOSIS — Z91013 Allergy to seafood: Secondary | ICD-10-CM

## 2018-07-11 DIAGNOSIS — M797 Fibromyalgia: Secondary | ICD-10-CM | POA: Diagnosis present

## 2018-07-11 DIAGNOSIS — I671 Cerebral aneurysm, nonruptured: Principal | ICD-10-CM | POA: Diagnosis present

## 2018-07-11 DIAGNOSIS — M722 Plantar fascial fibromatosis: Secondary | ICD-10-CM | POA: Diagnosis present

## 2018-07-11 DIAGNOSIS — Z90711 Acquired absence of uterus with remaining cervical stump: Secondary | ICD-10-CM

## 2018-07-11 DIAGNOSIS — F419 Anxiety disorder, unspecified: Secondary | ICD-10-CM | POA: Diagnosis present

## 2018-07-11 DIAGNOSIS — E785 Hyperlipidemia, unspecified: Secondary | ICD-10-CM | POA: Diagnosis present

## 2018-07-11 DIAGNOSIS — R51 Headache: Secondary | ICD-10-CM | POA: Diagnosis not present

## 2018-07-11 DIAGNOSIS — Z7902 Long term (current) use of antithrombotics/antiplatelets: Secondary | ICD-10-CM | POA: Diagnosis not present

## 2018-07-11 DIAGNOSIS — G4700 Insomnia, unspecified: Secondary | ICD-10-CM | POA: Diagnosis present

## 2018-07-11 DIAGNOSIS — Z881 Allergy status to other antibiotic agents status: Secondary | ICD-10-CM | POA: Diagnosis not present

## 2018-07-11 DIAGNOSIS — I6523 Occlusion and stenosis of bilateral carotid arteries: Secondary | ICD-10-CM | POA: Diagnosis present

## 2018-07-11 DIAGNOSIS — J309 Allergic rhinitis, unspecified: Secondary | ICD-10-CM | POA: Diagnosis present

## 2018-07-11 DIAGNOSIS — Z880 Allergy status to penicillin: Secondary | ICD-10-CM | POA: Diagnosis not present

## 2018-07-11 DIAGNOSIS — I10 Essential (primary) hypertension: Secondary | ICD-10-CM | POA: Diagnosis present

## 2018-07-11 DIAGNOSIS — Z79890 Hormone replacement therapy (postmenopausal): Secondary | ICD-10-CM | POA: Diagnosis not present

## 2018-07-11 DIAGNOSIS — H538 Other visual disturbances: Secondary | ICD-10-CM | POA: Diagnosis present

## 2018-07-11 DIAGNOSIS — G5600 Carpal tunnel syndrome, unspecified upper limb: Secondary | ICD-10-CM | POA: Diagnosis present

## 2018-07-11 DIAGNOSIS — K219 Gastro-esophageal reflux disease without esophagitis: Secondary | ICD-10-CM | POA: Diagnosis present

## 2018-07-11 DIAGNOSIS — Z23 Encounter for immunization: Secondary | ICD-10-CM | POA: Diagnosis not present

## 2018-07-11 DIAGNOSIS — I602 Nontraumatic subarachnoid hemorrhage from anterior communicating artery: Secondary | ICD-10-CM | POA: Diagnosis not present

## 2018-07-11 DIAGNOSIS — E039 Hypothyroidism, unspecified: Secondary | ICD-10-CM | POA: Diagnosis present

## 2018-07-11 HISTORY — PX: IR ANGIOGRAM FOLLOW UP STUDY: IMG697

## 2018-07-11 HISTORY — PX: IR ANGIO INTRA EXTRACRAN SEL INTERNAL CAROTID UNI L MOD SED: IMG5361

## 2018-07-11 HISTORY — PX: RADIOLOGY WITH ANESTHESIA: SHX6223

## 2018-07-11 HISTORY — PX: IR ANGIO EXTRACRAN SEL COM CAROTID INNOMINATE UNI R MOD SED: IMG5356

## 2018-07-11 HISTORY — PX: IR ANGIO VERTEBRAL SEL VERTEBRAL UNI L MOD SED: IMG5367

## 2018-07-11 HISTORY — PX: IR NEURO EACH ADD'L AFTER BASIC UNI LEFT (MS): IMG5373

## 2018-07-11 HISTORY — PX: IR ANGIO VERTEBRAL SEL SUBCLAVIAN INNOMINATE UNI R MOD SED: IMG5365

## 2018-07-11 HISTORY — PX: IR TRANSCATH/EMBOLIZ: IMG695

## 2018-07-11 LAB — CBC WITH DIFFERENTIAL/PLATELET
Abs Immature Granulocytes: 0.03 10*3/uL (ref 0.00–0.07)
Basophils Absolute: 0 10*3/uL (ref 0.0–0.1)
Basophils Relative: 0 %
EOS PCT: 0 %
Eosinophils Absolute: 0 10*3/uL (ref 0.0–0.5)
HEMATOCRIT: 40.1 % (ref 36.0–46.0)
HEMOGLOBIN: 12.8 g/dL (ref 12.0–15.0)
Immature Granulocytes: 1 %
LYMPHS ABS: 0.6 10*3/uL — AB (ref 0.7–4.0)
LYMPHS PCT: 12 %
MCH: 30.1 pg (ref 26.0–34.0)
MCHC: 31.9 g/dL (ref 30.0–36.0)
MCV: 94.4 fL (ref 80.0–100.0)
MONOS PCT: 1 %
Monocytes Absolute: 0.1 10*3/uL (ref 0.1–1.0)
Neutro Abs: 4.2 10*3/uL (ref 1.7–7.7)
Neutrophils Relative %: 86 %
Platelets: 229 10*3/uL (ref 150–400)
RBC: 4.25 MIL/uL (ref 3.87–5.11)
RDW: 12 % (ref 11.5–15.5)
WBC: 4.9 10*3/uL (ref 4.0–10.5)
nRBC: 0 % (ref 0.0–0.2)

## 2018-07-11 LAB — BASIC METABOLIC PANEL
Anion gap: 13 (ref 5–15)
BUN: 14 mg/dL (ref 8–23)
CHLORIDE: 107 mmol/L (ref 98–111)
CO2: 20 mmol/L — AB (ref 22–32)
CREATININE: 1.02 mg/dL — AB (ref 0.44–1.00)
Calcium: 9.9 mg/dL (ref 8.9–10.3)
GFR calc Af Amer: >60 mL/min (ref >60)
GFR calc non Af Amer: 56 mL/min — ABNORMAL LOW (ref >60)
Glucose, Bld: 154 mg/dL — ABNORMAL HIGH (ref 70–99)
Potassium: 3.9 mmol/L (ref 3.5–5.1)
Sodium: 140 mmol/L (ref 135–145)

## 2018-07-11 LAB — ABO/RH: ABO/RH(D): O NEG

## 2018-07-11 LAB — PROTIME-INR
INR: 1.08
Prothrombin Time: 13.9 seconds (ref 11.4–15.2)

## 2018-07-11 LAB — PLATELET INHIBITION P2Y12: PLATELET FUNCTION P2Y12: 132 [PRU] — AB (ref 194–418)

## 2018-07-11 LAB — PREPARE PLATELET PHERESIS: UNIT DIVISION: 0

## 2018-07-11 LAB — BPAM PLATELET PHERESIS
BLOOD PRODUCT EXPIRATION DATE: 201910182359
ISSUE DATE / TIME: 201910161040
UNIT TYPE AND RH: 5100

## 2018-07-11 LAB — MRSA PCR SCREENING: MRSA by PCR: NEGATIVE

## 2018-07-11 LAB — POCT ACTIVATED CLOTTING TIME
Activated Clotting Time: 180 seconds
Activated Clotting Time: 202 seconds

## 2018-07-11 LAB — APTT: aPTT: 31 seconds (ref 24–36)

## 2018-07-11 SURGERY — IR WITH ANESTHESIA
Anesthesia: General

## 2018-07-11 MED ORDER — LIDOCAINE 2% (20 MG/ML) 5 ML SYRINGE
INTRAMUSCULAR | Status: DC | PRN
  Administered 2018-07-11: 60 mg via INTRAVENOUS

## 2018-07-11 MED ORDER — ROCURONIUM BROMIDE 10 MG/ML (PF) SYRINGE: PREFILLED_SYRINGE | INTRAVENOUS | Status: DC | PRN

## 2018-07-11 MED ORDER — SUMATRIPTAN SUCCINATE 100 MG PO TABS
100.0000 mg | ORAL_TABLET | Freq: Once | ORAL | Status: DC
  Filled 2018-07-11: qty 1

## 2018-07-11 MED ORDER — FAMOTIDINE 20 MG PO TABS: 20.0000 mg | ORAL_TABLET | Freq: Every day | ORAL | Status: DC

## 2018-07-11 MED ORDER — SODIUM CHLORIDE 0.9 % IV SOLN
INTRAVENOUS | Status: DC | PRN
  Administered 2018-07-11: 25 ug/min via INTRAVENOUS

## 2018-07-11 MED ORDER — FAMOTIDINE 20 MG PO TABS
20.0000 mg | ORAL_TABLET | Freq: Every day | ORAL | Status: DC
  Administered 2018-07-11: 20 mg via ORAL
  Filled 2018-07-11: qty 1

## 2018-07-11 MED ORDER — CLOPIDOGREL BISULFATE 75 MG PO TABS
75.0000 mg | ORAL_TABLET | Freq: Every day | ORAL | Status: DC
  Administered 2018-07-12: 75 mg via ORAL
  Filled 2018-07-11: qty 1

## 2018-07-11 MED ORDER — ASPIRIN EC 325 MG PO TBEC: 325.0000 mg | DELAYED_RELEASE_TABLET | ORAL | Status: DC

## 2018-07-11 MED ORDER — FENTANYL CITRATE (PF) 100 MCG/2ML IJ SOLN: 25.0000 ug | INTRAMUSCULAR | Status: DC | PRN

## 2018-07-11 MED ORDER — CLEVIDIPINE BUTYRATE 0.5 MG/ML IV EMUL
INTRAVENOUS | Status: AC
  Filled 2018-07-11: qty 50

## 2018-07-11 MED ORDER — DEXAMETHASONE SODIUM PHOSPHATE 4 MG/ML IJ SOLN
INTRAMUSCULAR | Status: DC | PRN
  Administered 2018-07-11: 10 mg via INTRAVENOUS

## 2018-07-11 MED ORDER — CLOPIDOGREL BISULFATE 75 MG PO TABS
ORAL_TABLET | ORAL | Status: AC
  Administered 2018-07-11: 150 mg via ORAL
  Filled 2018-07-11: qty 2

## 2018-07-11 MED ORDER — IOHEXOL 300 MG/ML  SOLN
150.0000 mL | Freq: Once | INTRAMUSCULAR | Status: AC | PRN
  Administered 2018-07-11: 60 mL via INTRAVENOUS

## 2018-07-11 MED ORDER — PROPRANOLOL HCL ER 80 MG PO CP24: 80.0000 mg | ORAL_CAPSULE | Freq: Every day | ORAL | Status: DC

## 2018-07-11 MED ORDER — ASPIRIN 81 MG PO CHEW: 324.0000 mg | CHEWABLE_TABLET | Freq: Every day | ORAL | Status: DC

## 2018-07-11 MED ORDER — CLOPIDOGREL BISULFATE 75 MG PO TABS
150.0000 mg | ORAL_TABLET | ORAL | Status: AC
  Administered 2018-07-11: 150 mg via ORAL
  Filled 2018-07-11: qty 2

## 2018-07-11 MED ORDER — LEVOTHYROXINE SODIUM 112 MCG PO TABS: 112.0000 ug | ORAL_TABLET | Freq: Every day | ORAL | Status: DC

## 2018-07-11 MED ORDER — PROPOFOL 10 MG/ML IV BOLUS
INTRAVENOUS | Status: DC | PRN
  Administered 2018-07-11: 150 mg via INTRAVENOUS

## 2018-07-11 MED ORDER — EPTIFIBATIDE 20 MG/10ML IV SOLN
INTRAVENOUS | Status: AC
  Filled 2018-07-11: qty 10

## 2018-07-11 MED ORDER — ACETAMINOPHEN 160 MG/5ML PO SOLN: 650.0000 mg | ORAL | Status: DC | PRN

## 2018-07-11 MED ORDER — ACETAMINOPHEN 325 MG PO TABS
650.0000 mg | ORAL_TABLET | ORAL | Status: DC | PRN
  Administered 2018-07-11 – 2018-07-12 (×2): 650 mg via ORAL
  Filled 2018-07-11 (×2): qty 2

## 2018-07-11 MED ORDER — VANCOMYCIN HCL 1000 MG IV SOLR
1000.0000 mg | INTRAVENOUS | Status: DC
  Filled 2018-07-11: qty 1000

## 2018-07-11 MED ORDER — HEPARIN SODIUM (PORCINE) 1000 UNIT/ML IJ SOLN
INTRAMUSCULAR | Status: AC
  Filled 2018-07-11: qty 1

## 2018-07-11 MED ORDER — HEPARIN (PORCINE) IN NACL 100-0.45 UNIT/ML-% IJ SOLN
500.0000 [IU]/h | INTRAMUSCULAR | Status: DC
  Administered 2018-07-11: 500 [IU]/h via INTRAVENOUS
  Filled 2018-07-11: qty 250

## 2018-07-11 MED ORDER — SODIUM CHLORIDE 0.9 % IV SOLN
INTRAVENOUS | Status: DC
  Administered 2018-07-11 – 2018-07-12 (×2): via INTRAVENOUS

## 2018-07-11 MED ORDER — VANCOMYCIN HCL IN DEXTROSE 1-5 GM/200ML-% IV SOLN
1000.0000 mg | Freq: Once | INTRAVENOUS | Status: AC
  Administered 2018-07-11: 1000 mg via INTRAVENOUS

## 2018-07-11 MED ORDER — SUMATRIPTAN SUCCINATE 100 MG PO TABS: 100.0000 mg | ORAL_TABLET | Freq: Once | ORAL | Status: DC

## 2018-07-11 MED ORDER — CLEVIDIPINE BUTYRATE 0.5 MG/ML IV EMUL
INTRAVENOUS | Status: DC | PRN
  Administered 2018-07-11: 1 mg/h via INTRAVENOUS

## 2018-07-11 MED ORDER — NIMODIPINE 30 MG PO CAPS: 0.0000 mg | ORAL_CAPSULE | ORAL | Status: DC

## 2018-07-11 MED ORDER — ZOLPIDEM TARTRATE 5 MG PO TABS: 5.0000 mg | ORAL_TABLET | Freq: Every day | ORAL | Status: DC

## 2018-07-11 MED ORDER — LORATADINE 10 MG PO TABS: 10.0000 mg | ORAL_TABLET | Freq: Every day | ORAL | Status: DC

## 2018-07-11 MED ORDER — HEPARIN SODIUM (PORCINE) 1000 UNIT/ML IJ SOLN
INTRAMUSCULAR | Status: DC | PRN
  Administered 2018-07-11: 500 [IU] via INTRAVENOUS
  Administered 2018-07-11: 3000 [IU] via INTRAVENOUS
  Administered 2018-07-11: 500 [IU] via INTRAVENOUS

## 2018-07-11 MED ORDER — SUGAMMADEX SODIUM 200 MG/2ML IV SOLN
INTRAVENOUS | Status: DC | PRN
  Administered 2018-07-11: 175 mg via INTRAVENOUS

## 2018-07-11 MED ORDER — SODIUM CHLORIDE 0.9 % IV SOLN
INTRAVENOUS | Status: DC
  Administered 2018-07-11 (×2): via INTRAVENOUS

## 2018-07-11 MED ORDER — NITROGLYCERIN 1 MG/10 ML FOR IR/CATH LAB
INTRA_ARTERIAL | Status: AC
  Filled 2018-07-11: qty 10

## 2018-07-11 MED ORDER — IOHEXOL 300 MG/ML  SOLN
150.0000 mL | Freq: Once | INTRAMUSCULAR | Status: AC | PRN
  Administered 2018-07-11: 15 mL via INTRAVENOUS

## 2018-07-11 MED ORDER — ASPIRIN 325 MG PO TABS
325.0000 mg | ORAL_TABLET | Freq: Every day | ORAL | Status: DC
  Administered 2018-07-12: 325 mg via ORAL
  Filled 2018-07-11: qty 1

## 2018-07-11 MED ORDER — VANCOMYCIN HCL IN DEXTROSE 1-5 GM/200ML-% IV SOLN
INTRAVENOUS | Status: AC
  Filled 2018-07-11: qty 200

## 2018-07-11 MED ORDER — PROPRANOLOL HCL ER 80 MG PO CP24
80.0000 mg | ORAL_CAPSULE | Freq: Every day | ORAL | Status: DC
  Administered 2018-07-12: 80 mg via ORAL
  Filled 2018-07-11: qty 1

## 2018-07-11 MED ORDER — CLEVIDIPINE BUTYRATE 0.5 MG/ML IV EMUL
0.0000 mg/h | INTRAVENOUS | Status: AC
  Administered 2018-07-11 (×2): 4 mg/h via INTRAVENOUS
  Filled 2018-07-11 (×2): qty 50

## 2018-07-11 MED ORDER — ROCURONIUM BROMIDE 50 MG/5ML IV SOSY
PREFILLED_SYRINGE | INTRAVENOUS | Status: DC | PRN
  Administered 2018-07-11: 20 mg via INTRAVENOUS
  Administered 2018-07-11: 50 mg via INTRAVENOUS

## 2018-07-11 MED ORDER — ZOLPIDEM TARTRATE 5 MG PO TABS
5.0000 mg | ORAL_TABLET | Freq: Every day | ORAL | Status: DC
  Administered 2018-07-11: 5 mg via ORAL
  Filled 2018-07-11: qty 1

## 2018-07-11 MED ORDER — ONDANSETRON HCL 4 MG/2ML IJ SOLN
INTRAMUSCULAR | Status: DC | PRN
  Administered 2018-07-11: 4 mg via INTRAVENOUS

## 2018-07-11 MED ORDER — LORATADINE 10 MG PO TABS
10.0000 mg | ORAL_TABLET | Freq: Every day | ORAL | Status: DC
  Administered 2018-07-11: 10 mg via ORAL
  Filled 2018-07-11: qty 1

## 2018-07-11 MED ORDER — CLOPIDOGREL BISULFATE 75 MG PO TABS: 75.0000 mg | ORAL_TABLET | ORAL | Status: DC

## 2018-07-11 MED ORDER — INFLUENZA VAC SPLIT HIGH-DOSE 0.5 ML IM SUSY
0.5000 mL | PREFILLED_SYRINGE | INTRAMUSCULAR | Status: AC
  Administered 2018-07-12: 0.5 mL via INTRAMUSCULAR
  Filled 2018-07-11: qty 0.5

## 2018-07-11 MED ORDER — FENTANYL CITRATE (PF) 100 MCG/2ML IJ SOLN
INTRAMUSCULAR | Status: DC | PRN
  Administered 2018-07-11 (×2): 50 ug via INTRAVENOUS

## 2018-07-11 MED ORDER — LEVOTHYROXINE SODIUM 112 MCG PO TABS
112.0000 ug | ORAL_TABLET | Freq: Every day | ORAL | Status: DC
  Administered 2018-07-12: 112 ug via ORAL
  Filled 2018-07-11: qty 1

## 2018-07-11 MED ORDER — ACETAMINOPHEN 650 MG RE SUPP: 650.0000 mg | RECTAL | Status: DC | PRN

## 2018-07-11 MED ORDER — HEPARIN (PORCINE) IN NACL 100-0.45 UNIT/ML-% IJ SOLN
INTRAMUSCULAR | Status: AC
  Filled 2018-07-11: qty 250

## 2018-07-11 MED ORDER — CLOPIDOGREL BISULFATE 75 MG PO TABS: 75.0000 mg | ORAL_TABLET | Freq: Every day | ORAL | Status: DC

## 2018-07-11 NOTE — Anesthesia Procedure Notes (Signed)
Arterial Line Insertion Start/End10/16/2019 8:30 AM Performed by: Lowella Dell, CRNA, CRNA  Preanesthetic checklist: patient identified, IV checked, site marked, risks and benefits discussed, surgical consent, monitors and equipment checked, pre-op evaluation, timeout performed and anesthesia consent Lidocaine 1% used for infiltration Left, radial was placed Catheter size: 20 G Hand hygiene performed  and maximum sterile barriers used  Allen's test indicative of satisfactory collateral circulation Attempts: 3 (attempted by SRNA x 2) Procedure performed without using ultrasound guided technique. Following insertion, Biopatch and dressing applied. Patient tolerated the procedure well with no immediate complications.

## 2018-07-11 NOTE — Transfer of Care (Signed)
Immediate Anesthesia Transfer of Care Note  Patient: Amanda Fry  Procedure(s) Performed: IR WITH ANESTHESIAANEURYSM/EMBOLIZATION (N/A )  Patient Location: PACU  Anesthesia Type:General  Level of Consciousness: awake, alert , oriented and patient cooperative  Airway & Oxygen Therapy: Patient Spontanous Breathing and Patient connected to nasal cannula oxygen  Post-op Assessment: Report given to RN, Post -op Vital signs reviewed and stable and Patient moving all extremities X 4  Post vital signs: Reviewed and stable  Last Vitals:  Vitals Value Taken Time  BP 124/79 07/11/2018 11:50 AM  Temp    Pulse 83 07/11/2018 11:53 AM  Resp 11 07/11/2018 11:53 AM  SpO2 100 % 07/11/2018 11:53 AM  Vitals shown include unvalidated device data.  Last Pain:  Vitals:   07/11/18 0749  PainSc: 0-No pain         Complications: No apparent anesthesia complications

## 2018-07-11 NOTE — Anesthesia Postprocedure Evaluation (Signed)
Anesthesia Post Note  Patient: Amanda Fry  Procedure(s) Performed: IR WITH ANESTHESIAANEURYSM/EMBOLIZATION (N/A )     Patient location during evaluation: PACU Anesthesia Type: General Level of consciousness: awake and alert Pain management: pain level controlled Vital Signs Assessment: post-procedure vital signs reviewed and stable Respiratory status: spontaneous breathing, nonlabored ventilation, respiratory function stable and patient connected to nasal cannula oxygen Cardiovascular status: blood pressure returned to baseline and stable Postop Assessment: no apparent nausea or vomiting Anesthetic complications: no    Last Vitals:  Vitals:   07/11/18 1220 07/11/18 1235  BP: 107/62 115/69  Pulse: 85 89  Resp: 14 (!) 9  Temp:    SpO2: 100% 98%    Last Pain:  Vitals:   07/11/18 1150  PainSc: 4                  Fatima Fedie,W. EDMOND

## 2018-07-11 NOTE — Progress Notes (Signed)
Patient ID: Amanda Fry, female   DOB: Feb 21, 1951, 67 y.o.   MRN: [id number] INR. Post procedure. Extubated without difficulty. Denies any H/As,N/V or visual or motor symptoms. VS BP 160s/70s HR 80s SR PAO2 > 95 %  Pupils 18mm Rt = LT  . No Facial asymmetry Tongue midline. Motor equal proportional  to effort. RT GRoin  Soft > Distal pulses palpable  Both DPs and PTs unchanged. S.Regis Wiland MD

## 2018-07-11 NOTE — Sedation Documentation (Signed)
Right groin sheath removed. 6Fr. Exoseal and V-pad applied.

## 2018-07-11 NOTE — Anesthesia Preprocedure Evaluation (Signed)
Anesthesia Evaluation  Patient identified by MRN, date of birth, ID band Patient awake    Reviewed: Allergy & Precautions, H&P , NPO status , Patient's Chart, lab work & pertinent test results, reviewed documented beta blocker date and time   History of Anesthesia Complications (+) PONV  Airway Mallampati: III  TM Distance: >3 FB Neck ROM: Full    Dental no notable dental hx. (+) Teeth Intact, Dental Advisory Given   Pulmonary neg pulmonary ROS, former smoker,    Pulmonary exam normal breath sounds clear to auscultation       Cardiovascular hypertension, Pt. on medications and Pt. on home beta blockers  Rhythm:Regular Rate:Normal     Neuro/Psych  Headaches, Anxiety    GI/Hepatic Neg liver ROS, GERD  Medicated and Controlled,  Endo/Other  Hypothyroidism   Renal/GU negative Renal ROS  negative genitourinary   Musculoskeletal  (+) Fibromyalgia -  Abdominal   Peds  Hematology negative hematology ROS (+)   Anesthesia Other Findings   Reproductive/Obstetrics negative OB ROS                             Anesthesia Physical Anesthesia Plan  ASA: III  Anesthesia Plan: General   Post-op Pain Management:    Induction: Intravenous  PONV Risk Score and Plan: 4 or greater and Ondansetron, Dexamethasone and Midazolam  Airway Management Planned: Oral ETT  Additional Equipment: Arterial line  Intra-op Plan:   Post-operative Plan: Extubation in OR  Informed Consent: I have reviewed the patients History and Physical, chart, labs and discussed the procedure including the risks, benefits and alternatives for the proposed anesthesia with the patient or authorized representative who has indicated his/her understanding and acceptance.   Dental advisory given  Plan Discussed with: CRNA  Anesthesia Plan Comments:         Anesthesia Quick Evaluation

## 2018-07-11 NOTE — Progress Notes (Signed)
Saw patient in neuro ICU following procedure. Patient underwent an image-guided cerebral angiogram with stent assisted embolization of ACOM aneurysm.  Patient awake and alert laying in bed. Accompanied by husband and daughter at bedside. Denies headache, weakness, numbness/tingling, dizziness, vision changes, hearing changes, tinnitus, or speech difficulty.  Alert, awake, and oriented x3. Speech and comprehension intact. PERRL bilaterally. EOMs intact bilaterally without nystagmus or subjective diplopia. Visual fields not assessed. No facial asymmetry. Tongue midline. Motor power symmetric proportional to effort. No pronator drift. Fine motor and coordination intact and symmetric. Gait not assessed. Romberg not assessed. Heel to toe not assessed. Distal pulses 2+ bilaterally. Right groin incision stable without active bleeding or hematoma.  Plan to stay in neuro ICU for overnight observation. IR to follow.  Bea Graff Louk, PA-C 07/11/2018, 4:57 PM

## 2018-07-11 NOTE — Procedures (Signed)
S/P Lt common and Lt vertebral arteriograms. RT CFA approach. S/P stent assisted embolization of ACOM aneurysm

## 2018-07-11 NOTE — H&P (Deleted)
  The note originally documented on this encounter has been moved the the encounter in which it belongs.  

## 2018-07-11 NOTE — H&P (Signed)
Chief Complaint: Patient was seen in consultation today for ACOM aneurysm.  Referring Physician(s): Tat, Eustace Quail  Supervising Physician: Luanne Bras  Patient Status: [facility] - Out-pt  History of Present Illness: Amanda Fry is a 67 y.o. female with a past medical history of hypertension, hyperlipidemia, hypothyroidism, GERD, kidney cyst, fibromyalgia, carpal tunnel syndrome, allergic rhinitis, insomnia, and anxiety. She was referred to Dr. Estanislado Pandy by Dr. Carles Collet for management of an incidental finding of an ACOM aneurysm. She underwent a diagnostic cerebral angiogram with Dr. Estanislado Pandy 05/10/2018. At that time, it was discovered that she had proximal left ICA stenosis. She decided to move forward with treatment and underwent a cerebral angiogram with revascularization of her proximal left ICA stenosis using stent assisted angioplasty 05/15/2018 with Dr. Estanislado Pandy. She was discharged home on stable condition on 05/16/2018. She re-consulted with Dr. Estanislado Pandy 05/30/2018 to discuss management of her ACOM aneurysm. At that time, patient decided to pursue endovascular intervention of her ACOM aneurysm.  Patient presents today for possible image-guided cerebral angiogram with possible ACOM aneurysm embolization. Patient awake and alert sitting in bed. Accompanied by husband at bedside. Complains of constant left-sided blurred vision for the past month. Denies fever, chills, chest pain, dyspnea, abdominal pain, headache, dizziness, weakness, numbness/tingling, diplopia, hearing changes, tinnitus, or speech difficulty.  Patient is currently taking Plavix 75 mg once daily and Aspirin 325 mg once daily.   Past Medical History:  Diagnosis Date  . Allergic rhinitis   . Anterior communicating artery aneurysm   . Anxiety   . Carpal tunnel syndrome   . Cyst of kidney, acquired   . Esophageal reflux   . Family history of adverse reaction to anesthesia    sister N/V with anesthesia  .  Fibromyalgia   . GERD (gastroesophageal reflux disease)   . Headache   . HLD (hyperlipidemia)   . Hypertension   . Hypothyroidism   . Insomnia   . Plantar fasciitis    left foot  . PONV (postoperative nausea and vomiting)     Past Surgical History:  Procedure Laterality Date  . APPENDECTOMY  2001  . BREAST LUMPECTOMY Left 1994  . CESAREAN SECTION    . IR ANGIO INTRA EXTRACRAN SEL COM CAROTID INNOMINATE BILAT MOD SED  05/10/2018  . IR ANGIO VERTEBRAL SEL VERTEBRAL BILAT MOD SED  05/10/2018  . IR INTRAVSC STENT CERV CAROTID W/EMB-PROT MOD SED INCL ANGIO  05/15/2018  . PARTIAL HYSTERECTOMY  1994  . RADIOLOGY WITH ANESTHESIA N/A 05/10/2018   Procedure: EMBOLIZATION;  Surgeon: Luanne Bras, MD;  Location: [place];  Service: Radiology;  Laterality: N/A;  . RADIOLOGY WITH ANESTHESIA N/A 05/15/2018   Procedure: Angioplasty with stenting;  Surgeon: Luanne Bras, MD;  Location: [place];  Service: Radiology;  Laterality: N/A;  . TONSILLECTOMY      Allergies: Clams [shellfish allergy]; Augmentin [amoxicillin-pot clavulanate]; and Biaxin [clarithromycin]  Medications: Prior to Admission medications   Medication Sig Start Date End Date Taking? Authorizing Provider  ARTIFICIAL TEAR SOLUTION OP Place 1 drop into both eyes daily as needed (dry eyes).    [provider]  aspirin EC 325 MG tablet Take 325 mg by mouth at bedtime.    [provider]  atorvastatin (LIPITOR) 40 MG tablet Take 1 tablet (40 mg total) by mouth daily. Patient taking differently: Take 40 mg by mouth at bedtime.  08/15/17 06/27/18  Josue Hector, MD  Calcium Carb-Cholecalciferol (CALCIUM 600+D) 600-800 MG-UNIT TABS Take 1 tablet by mouth at bedtime.  [provider]  cetirizine (ZYRTEC) 10 MG tablet Take 10 mg by mouth daily as needed for allergies.     [provider]  cholecalciferol (VITAMIN D) 1000 units tablet Take 1,000 Units by mouth 2 (two) times daily.    [provider]  clopidogrel (PLAVIX) 75 MG tablet Take 75 mg by mouth at bedtime.    [provider]  Glucos-Chond-Hyal Ac-Ca Fructo (MOVE FREE JOINT HEALTH ADVANCE PO) Take 1 tablet by mouth at bedtime.     [provider]  Magnesium 500 MG CAPS Take 500 mg by mouth at bedtime.     [provider]  propranolol ER (INDERAL LA) 80 MG 24 hr capsule Take 1 capsule (80 mg total) by mouth daily. 01/24/17   Tat, Eustace Quail, DO  ranitidine (ZANTAC) 150 MG tablet Take 150 mg by mouth at bedtime.     [provider]  SUMAtriptan (IMITREX) 100 MG tablet Take 1 tablet (100 mg total) by mouth once. May repeat in 2 hours if headache persists or recurs. Not to exceed 2 per day. 03/16/16   Tat, Eustace Quail, DO  SYNTHROID 112 MCG tablet Take 112 mcg by mouth daily. 01/18/17   [provider]  zolpidem (AMBIEN) 5 MG tablet Take 5 mg at bedtime by mouth.    [provider]     Family History  Problem Relation Age of Onset  . CAD Father 66       CABG  . Cancer Mother        BREAST  . Healthy Brother   . Healthy Brother   . Diabetes Sister   . Cancer Sister        BREAST  . Healthy Son   . Healthy Daughter   . Heart disease Maternal Grandmother   . Heart disease Maternal Grandfather   . Emphysema Paternal Grandfather     Social History   Socioeconomic History  . Marital status: Married    Spouse name: Not on file  . Number of children: Not on file  . Years of education: Not on file  . Highest education level: Not on file  Occupational History  . Not on file  Social Needs  . Financial resource strain: Not on file  . Food insecurity:    Worry: Not on file    Inability: Not on file  . Transportation needs:    Medical: Not on file    Non-medical: Not on file  Tobacco Use  . Smoking status: Former Smoker    Packs/day: 0.50    Years: 10.00    Pack years: 5.00    Types: Cigarettes    Last attempt to quit: 09/26/2009    Years since quitting: 8.7    . Smokeless tobacco: Never Used  Substance and Sexual Activity  . Alcohol use: Yes    Alcohol/week: 7.0 standard drinks    Types: 7 Glasses of wine per week    Comment: not since April 30, 2018  . Drug use: No  . Sexual activity: Not on file  Lifestyle  . Physical activity:    Days per week: Not on file    Minutes per session: Not on file  . Stress: Not on file  Relationships  . Social connections:    Talks on phone: Not on file    Gets together: Not on file    Attends religious service: Not on file    Active member of club or organization: Not on file  Attends meetings of clubs or organizations: Not on file    Relationship status: Not on file  Other Topics Concern  . Not on file  Social History Narrative  . Not on file     Review of Systems: A 12 point ROS discussed and pertinent positives are indicated in the HPI above.  All other systems are negative.  Review of Systems  Constitutional: Negative for chills and fever.  HENT: Negative for hearing loss and tinnitus.   Eyes: Positive for visual disturbance.  Respiratory: Negative for shortness of breath and wheezing.   Cardiovascular: Negative for chest pain and palpitations.  Gastrointestinal: Negative for abdominal pain.  Neurological: Negative for dizziness, speech difficulty, weakness, numbness and headaches.  Psychiatric/Behavioral: Negative for behavioral problems and confusion.    Vital Signs: There were no vitals taken for this visit.  Physical Exam  Constitutional: She is oriented to person, place, and time. She appears well-developed and well-nourished. No distress.  Cardiovascular: Normal rate, regular rhythm and normal heart sounds.  No murmur heard. Pulmonary/Chest: Effort normal and breath sounds normal. No respiratory distress. She has no wheezes.  Abdominal: Soft. There is no tenderness.  Neurological: She is alert and oriented to person, place, and time.  Skin: Skin is warm and dry.   Psychiatric: She has a normal mood and affect. Her behavior is normal. Judgment and thought content normal.  Nursing note and vitals reviewed.    MD Evaluation Airway: WNL Heart: WNL Abdomen: WNL Chest/ Lungs: WNL ASA  Classification: 2 Mallampati/Airway Score: One   Imaging: No results found.  Labs:  CBC: Recent Labs    05/15/18 0707 05/16/18 1059 07/03/18 1526 07/11/18 0728  WBC 6.0 9.5 13.2* 4.9  HGB 13.8 10.5* 13.0 12.8  HCT 42.1 32.7* 41.6 40.1  PLT 211 174 259 229    COAGS: Recent Labs    05/07/18 1100 05/15/18 0707 07/03/18 1526 07/11/18 0728  INR 1.06 1.05 1.07 1.08  APTT  --   --  29 31    BMP: Recent Labs    05/15/18 0707 05/16/18 1059 07/03/18 1526 07/11/18 0728  NA 139 141 141 140  K 4.1 3.8 4.1 3.9  CL 111 115* 111 107  CO2 20* 21* 19* 20*  GLUCOSE 147* 98 112* 154*  BUN 22 18 18 14   CALCIUM 9.3 8.5* 10.0 9.9  CREATININE 1.24* 0.99 0.95 1.02*  GFRNONAA 45* 59* >60 56*  GFRAA 52* >60 >60 >60    LIVER FUNCTION TESTS: Recent Labs    10/17/17 0808  BILITOT 0.2  AST 27  ALT 36*  ALKPHOS 59  PROT 6.2  ALBUMIN 4.3    TUMOR MARKERS: No results for input(s): AFPTM, CEA, CA199, CHROMGRNA in the last 8760 hours.  Assessment and Plan:  ACOM aneurysm. Plan for image-guided cerebral angiogram with possible ACOM aneurysm embolization today with Dr. Estanislado Pandy. Patient is NPO. Afebrile and WBCs WNL. INR 1.08 seconds this AM. P2Y12 132 PRU this AM.  Risks and benefits of cerebral angiogram with intervention were discussed with the patient including, but not limited to bleeding, infection, vascular injury, contrast induced renal failure, stroke or even death. This interventional procedure involves the use of X-rays and because of the nature of the planned procedure, it is possible that we will have prolonged use of X-ray fluoroscopy. Potential radiation risks to you include (but are not limited to) the following: - A slightly  elevated risk for cancer  several years later in life. This risk is typically less  than 0.5% percent. This risk is low in comparison to the normal incidence of human cancer, which is 33% for women and 50% for men according to the [place]. - Radiation induced injury can include skin redness, resembling a rash, tissue breakdown / ulcers and hair loss (which can be temporary or permanent).  The likelihood of either of these occurring depends on the difficulty of the procedure and whether you are sensitive to radiation due to previous procedures, disease, or genetic conditions.  IF your procedure requires a prolonged use of radiation, you will be notified and given written instructions for further action.  It is your responsibility to monitor the irradiated area for the 2 weeks following the procedure and to notify your physician if you are concerned that you have suffered a radiation induced injury.   All of the patient's questions were answered, patient is agreeable to proceed. Consent signed and in chart.   Thank you for this interesting consult.  I greatly enjoyed meeting MACKENNA KAMER and look forward to participating in their care.  A copy of this report was sent to the requesting provider on this date.  Electronically Signed: Earley Abide, PA-C 07/11/2018, 8:05 AM   I spent a total of 25 Minutes in face to face in clinical consultation, greater than 50% of which was counseling/coordinating care for ACOM aneurysm.

## 2018-07-11 NOTE — Anesthesia Procedure Notes (Signed)
Procedure Name: Intubation Date/Time: 07/11/2018 9:21 AM Performed by: Lowella Dell, CRNA Pre-anesthesia Checklist: Patient identified, Emergency Drugs available, Suction available and Patient being monitored Patient Re-evaluated:Patient Re-evaluated prior to induction Oxygen Delivery Method: Circle System Utilized Preoxygenation: Pre-oxygenation with 100% oxygen Induction Type: IV induction Ventilation: Mask ventilation without difficulty Laryngoscope Size: Mac and 3 Grade View: Grade II Tube type: Oral Tube size: 7.0 mm Number of attempts: 1 Airway Equipment and Method: Stylet Placement Confirmation: ETT inserted through vocal cords under direct vision,  positive ETCO2 and breath sounds checked- equal and bilateral Secured at: 22 cm Tube secured with: Tape Dental Injury: Teeth and Oropharynx as per pre-operative assessment  Comments: Placed by Junie Bame, SRNA

## 2018-07-12 ENCOUNTER — Encounter (HOSPITAL_COMMUNITY): Payer: Self-pay | Admitting: Interventional Radiology

## 2018-07-12 LAB — BASIC METABOLIC PANEL
Anion gap: 6 (ref 5–15)
BUN: 11 mg/dL (ref 8–23)
CALCIUM: 9 mg/dL (ref 8.9–10.3)
CHLORIDE: 111 mmol/L (ref 98–111)
CO2: 23 mmol/L (ref 22–32)
CREATININE: 0.93 mg/dL (ref 0.44–1.00)
GFR calc non Af Amer: >60 mL/min (ref >60)
Glucose, Bld: 115 mg/dL — ABNORMAL HIGH (ref 70–99)
Potassium: 3.8 mmol/L (ref 3.5–5.1)
Sodium: 140 mmol/L (ref 135–145)

## 2018-07-12 LAB — PLATELET INHIBITION P2Y12: PLATELET FUNCTION P2Y12: 212 [PRU] (ref 194–418)

## 2018-07-12 LAB — CBC WITH DIFFERENTIAL/PLATELET
Abs Immature Granulocytes: 0.09 10*3/uL — ABNORMAL HIGH (ref 0.00–0.07)
BASOS ABS: 0 10*3/uL (ref 0.0–0.1)
Basophils Relative: 0 %
EOS ABS: 0 10*3/uL (ref 0.0–0.5)
EOS PCT: 0 %
HCT: 34.3 % — ABNORMAL LOW (ref 36.0–46.0)
Hemoglobin: 11.2 g/dL — ABNORMAL LOW (ref 12.0–15.0)
Immature Granulocytes: 1 %
LYMPHS PCT: 8 %
Lymphs Abs: 0.9 10*3/uL (ref 0.7–4.0)
MCH: 30.9 pg (ref 26.0–34.0)
MCHC: 32.7 g/dL (ref 30.0–36.0)
MCV: 94.8 fL (ref 80.0–100.0)
Monocytes Absolute: 0.8 10*3/uL (ref 0.1–1.0)
Monocytes Relative: 7 %
NRBC: 0 % (ref 0.0–0.2)
Neutro Abs: 9.7 10*3/uL — ABNORMAL HIGH (ref 1.7–7.7)
Neutrophils Relative %: 84 %
Platelets: 190 10*3/uL (ref 150–400)
RBC: 3.62 MIL/uL — AB (ref 3.87–5.11)
RDW: 12.2 % (ref 11.5–15.5)
WBC: 11.5 10*3/uL — AB (ref 4.0–10.5)

## 2018-07-12 LAB — HEPARIN LEVEL (UNFRACTIONATED): HEPARIN UNFRACTIONATED: 0.12 [IU]/mL — AB (ref 0.30–0.70)

## 2018-07-12 NOTE — Discharge Summary (Signed)
Patient ID: Amanda Fry MRN: [id number] DOB/AGE: December 11, 1950 67 y.o.  Admit date: 07/11/2018 Discharge date: 07/12/2018  Supervising Physician: Amanda Fry  Patient Status: [facility] - In-pt  Admission Diagnoses: ACOM aneurysm  Discharge Diagnoses:  Active Problems:   Brain aneurysm   Discharged Condition: good  Hospital Course: 67 y/o F with PMH significant for hypertension, hyperlipidemia, hypothyroidism, GERD, kidney cyst, fibromyalgia, carpal tunnel syndrome, allergic rhinitis, insomnia, and anxiety. Patient was referred to [facility] originally by Dr. Carles Fry for management of ACOM aneurysm which was found on work up for other issues - she underwent cerebral angiogram on 05/10/18 which also showed proximal left ICA stenosis. She underwent stent assisted angioplasty of proximal left ICA on 05/15/18 and was discharged in stable condition on 05/16/18.   She was seen again on 05/30/18 to discuss management of her ACOM aneurysm, at that time she decided to pursue endovascular intervention. Patient ultimately underwent left common and vertebral arteriograms via right CFA approach with stent assisted embolization of ACOM aneurysm. She was extubated without difficulty, neuro and vascular exam in tact post procedure. No acute events overnight per patient and RN, patient denies complaints today aside from some soreness at groin incision site and sore throat which has improved after drinking apple juice. She is ready to go home and is awaiting the arrival of her husband. She requests foley catheter not be removed until husband's arrival.  Consults: None  Significant Diagnostic Studies: No results found.  Treatments: IV hydration, antibiotics: vancomycin, analgesia: acetaminophen and fentanyl, cardiac meds: propranolol and clevidipine and anticoagulation: heparin  Discharge Exam: Blood pressure 112/80, pulse 91, temperature 97.9 F (36.6 C), temperature source Oral, resp. rate 15, height 5\' 8"   (1.727 m), weight 193 lb (87.5 kg), SpO2 94 %. Physical Exam  Constitutional: She is oriented to person, place, and time. No distress.  Cardiovascular: Normal rate, regular rhythm and normal heart sounds.  Pulmonary/Chest: Effort normal and breath sounds normal.  Abdominal: Soft.  Neurological: She is alert and oriented to person, place, and time.  Speech and comprehension intact. PERRL bilaterally. EOMs intact bilaterally without nystagmus or subjective diplopia. Visual fields not assessed. No facial asymmetry. Tongue midline. Motor power symmetric proportional to effort. No pronator drift. Fine motor and coordination intact and symmetric. Gait not assessed. Romberg not assessed. Heel to toe not assessed. Distal pulses 2+ bilaterally.  Skin: Skin is warm and dry. She is not diaphoretic.  Right groin incision site soft, clean, dry, intact without active bleeding or hematoma noted. Minimal pain on palpation.  Psychiatric: She has a normal mood and affect. Her behavior is normal. Judgment and thought content normal.  Nursing note and vitals reviewed.   Disposition: Discharge disposition: 01-Home or Self Care        Allergies as of 07/12/2018      Reactions   Clams [shellfish Allergy] Swelling   SWELLING REACTION UNSPECIFIED    Augmentin [amoxicillin-pot Clavulanate] Nausea And Vomiting   Has patient had a PCN reaction causing immediate rash, facial/tongue/throat swelling, SOB or lightheadedness with hypotension: No Has patient had a PCN reaction causing severe rash involving mucus membranes or skin necrosis: No Has patient had a PCN reaction that required hospitalization: No Has patient had a PCN reaction occurring within the last 10 years: Yes If all of the above answers are "NO", then may proceed with Cephalosporin use.   Biaxin [clarithromycin] Nausea And Vomiting      Medication List    TAKE these medications   ARTIFICIAL TEAR  SOLUTION OP Place 1 drop into both  eyes daily as needed (dry eyes).   aspirin EC 325 MG tablet Take 325 mg by mouth at bedtime.   atorvastatin 40 MG tablet Commonly known as:  LIPITOR Take 1 tablet (40 mg total) by mouth daily. What changed:  when to take this   CALCIUM 600+D 600-800 MG-UNIT Tabs Generic drug:  Calcium Carb-Cholecalciferol Take 1 tablet by mouth at bedtime.   cetirizine 10 MG tablet Commonly known as:  ZYRTEC Take 10 mg by mouth daily as needed for allergies.   cholecalciferol 1000 units tablet Commonly known as:  VITAMIN D Take 1,000 Units by mouth 2 (two) times daily.   clopidogrel 75 MG tablet Commonly known as:  PLAVIX Take 75 mg by mouth at bedtime.   Magnesium 500 MG Caps Take 500 mg by mouth at bedtime.   MOVE FREE JOINT HEALTH ADVANCE PO Take 1 tablet by mouth at bedtime.   propranolol ER 80 MG 24 hr capsule Commonly known as:  INDERAL LA Take 1 capsule (80 mg total) by mouth daily.   ranitidine 150 MG tablet Commonly known as:  ZANTAC Take 150 mg by mouth at bedtime.   SUMAtriptan 100 MG tablet Commonly known as:  IMITREX Take 1 tablet (100 mg total) by mouth once. May repeat in 2 hours if headache persists or recurs. Not to exceed 2 per day.   SYNTHROID 112 MCG tablet Generic drug:  levothyroxine Take 112 mcg by mouth daily.   zolpidem 5 MG tablet Commonly known as:  AMBIEN Take 5 mg at bedtime by mouth.      Follow-up Information    Amanda Bras, MD Follow up in 2 week(s).   Specialties:  Interventional Radiology, Radiology Why:  Please follow-up with Dr. Estanislado Fry in clinic 2 weeks after discharge. Contact information: [place] [address] [phone]            Electronically Signed: Joaquim Nam, PA-C 07/12/2018, 8:55 AM   I have spent Greater Than 30 Minutes discharging Amanda Fry.

## 2018-07-12 NOTE — Progress Notes (Signed)
Amanda Fry discharged home per MD order. Discharge instructions reviewed and discussed with the patient, all questions and concerns answered. Copy of instructions and scripts given to patient.  Allergies as of 07/12/2018      Reactions   Clams [shellfish Allergy] Swelling   SWELLING REACTION UNSPECIFIED    Augmentin [amoxicillin-pot Clavulanate] Nausea And Vomiting   Has patient had a PCN reaction causing immediate rash, facial/tongue/throat swelling, SOB or lightheadedness with hypotension: No Has patient had a PCN reaction causing severe rash involving mucus membranes or skin necrosis: No Has patient had a PCN reaction that required hospitalization: No Has patient had a PCN reaction occurring within the last 10 years: Yes If all of the above answers are "NO", then may proceed with Cephalosporin use.   Biaxin [clarithromycin] Nausea And Vomiting      Medication List    TAKE these medications   ARTIFICIAL TEAR SOLUTION OP Place 1 drop into both eyes daily as needed (dry eyes).   aspirin EC 325 MG tablet Take 325 mg by mouth at bedtime.   atorvastatin 40 MG tablet Commonly known as:  LIPITOR Take 1 tablet (40 mg total) by mouth daily. What changed:  when to take this   CALCIUM 600+D 600-800 MG-UNIT Tabs Generic drug:  Calcium Carb-Cholecalciferol Take 1 tablet by mouth at bedtime.   cetirizine 10 MG tablet Commonly known as:  ZYRTEC Take 10 mg by mouth daily as needed for allergies.   cholecalciferol 1000 units tablet Commonly known as:  VITAMIN D Take 1,000 Units by mouth 2 (two) times daily.   clopidogrel 75 MG tablet Commonly known as:  PLAVIX Take 75 mg by mouth at bedtime.   Magnesium 500 MG Caps Take 500 mg by mouth at bedtime.   MOVE FREE JOINT HEALTH ADVANCE PO Take 1 tablet by mouth at bedtime.   propranolol ER 80 MG 24 hr capsule Commonly known as:  INDERAL LA Take 1 capsule (80 mg total) by mouth daily.   ranitidine 150 MG tablet Commonly known  as:  ZANTAC Take 150 mg by mouth at bedtime.   SUMAtriptan 100 MG tablet Commonly known as:  IMITREX Take 1 tablet (100 mg total) by mouth once. May repeat in 2 hours if headache persists or recurs. Not to exceed 2 per day.   SYNTHROID 112 MCG tablet Generic drug:  levothyroxine Take 112 mcg by mouth daily.   zolpidem 5 MG tablet Commonly known as:  AMBIEN Take 5 mg at bedtime by mouth.       Patients skin is clean, dry and intact, no evidence of skin break down. Right groin site level 0, Vpad was removed at 1000. Site is clean, dry, and intact with no signs of bleeding or hematoma.  IV and arterial site discontinued and catheter remains intact. Site without signs and symptoms of complications. Dressing and pressure applied.  Patient escorted to car by Lovena Le, RN in a wheelchair,  no distress noted upon discharge.  Amanda Fry 07/12/2018 1:08 PM

## 2018-07-12 NOTE — Progress Notes (Signed)
ANTICOAGULATION CONSULT NOTE - Initial Consult  Pharmacy Consult for Heparin Indication: s/p cerebral aneurysm embolization  Allergies  Allergen Reactions  . Clams [Shellfish Allergy] Swelling    SWELLING REACTION UNSPECIFIED   . Augmentin [Amoxicillin-Pot Clavulanate] Nausea And Vomiting    Has patient had a PCN reaction causing immediate rash, facial/tongue/throat swelling, SOB or lightheadedness with hypotension: No Has patient had a PCN reaction causing severe rash involving mucus membranes or skin necrosis: No Has patient had a PCN reaction that required hospitalization: No Has patient had a PCN reaction occurring within the last 10 years: Yes If all of the above answers are "NO", then may proceed with Cephalosporin use.   . Biaxin [Clarithromycin] Nausea And Vomiting    Patient Measurements: Height: 5\' 8"  (172.7 cm) Weight: 193 lb (87.5 kg) IBW/kg (Calculated) : 63.9  Vital Signs: Temp: 98 F (36.7 C) (10/17 0000) Temp Source: Oral (10/17 0000) BP: 112/77 (10/17 0100) Pulse Rate: 76 (10/17 0100)  Labs: Recent Labs    07/11/18 0728 07/12/18 0111  HGB 12.8  --   HCT 40.1  --   PLT 229  --   APTT 31  --   LABPROT 13.9  --   INR 1.08  --   HEPARINUNFRC  --  0.12*  CREATININE 1.02*  --     Estimated Creatinine Clearance: 63.6 mL/min (A) (by C-G formula based on SCr of 1.02 mg/dL (H)).   Medical History: Past Medical History:  Diagnosis Date  . Allergic rhinitis   . Anterior communicating artery aneurysm   . Anxiety   . Carpal tunnel syndrome   . Cyst of kidney, acquired   . Esophageal reflux   . Family history of adverse reaction to anesthesia    sister N/V with anesthesia  . Fibromyalgia   . GERD (gastroesophageal reflux disease)   . Headache   . HLD (hyperlipidemia)   . Hypertension   . Hypothyroidism   . Insomnia   . Plantar fasciitis    left foot  . PONV (postoperative nausea and vomiting)     Medications:  Medications Prior to Admission   Medication Sig Dispense Refill Last Dose  . ARTIFICIAL TEAR SOLUTION OP Place 1 drop into both eyes daily as needed (dry eyes).   Past Month at Unknown time  . aspirin EC 325 MG tablet Take 325 mg by mouth at bedtime.   07/11/2018 at 0500  . atorvastatin (LIPITOR) 40 MG tablet Take 1 tablet (40 mg total) by mouth daily. (Patient taking differently: Take 40 mg by mouth at bedtime. ) 90 tablet 1 07/10/2018 at Unknown time  . Calcium Carb-Cholecalciferol (CALCIUM 600+D) 600-800 MG-UNIT TABS Take 1 tablet by mouth at bedtime.   Past Week at Unknown time  . cetirizine (ZYRTEC) 10 MG tablet Take 10 mg by mouth daily as needed for allergies.    07/10/2018 at Unknown time  . cholecalciferol (VITAMIN D) 1000 units tablet Take 1,000 Units by mouth 2 (two) times daily.   Past Week at Unknown time  . clopidogrel (PLAVIX) 75 MG tablet Take 75 mg by mouth at bedtime.   07/11/2018 at 0500  . Glucos-Chond-Hyal Ac-Ca Fructo (MOVE FREE JOINT HEALTH ADVANCE PO) Take 1 tablet by mouth at bedtime.    Past Week at Unknown time  . Magnesium 500 MG CAPS Take 500 mg by mouth at bedtime.    Past Week at Unknown time  . propranolol ER (INDERAL LA) 80 MG 24 hr capsule Take 1 capsule (80 mg  total) by mouth daily. 90 capsule 1 07/11/2018 at 0500  . ranitidine (ZANTAC) 150 MG tablet Take 150 mg by mouth at bedtime.    07/10/2018 at Unknown time  . SYNTHROID 112 MCG tablet Take 112 mcg by mouth daily.  0 07/11/2018 at 0500  . zolpidem (AMBIEN) 5 MG tablet Take 5 mg at bedtime by mouth.   07/10/2018 at Unknown time  . SUMAtriptan (IMITREX) 100 MG tablet Take 1 tablet (100 mg total) by mouth once. May repeat in 2 hours if headache persists or recurs. Not to exceed 2 per day. 9 tablet 0 More than a month at Unknown time    Assessment: 68 y.o. female s/p ACom aneurysm embolization for heparin Goal of Therapy:  Heparin level 0.1-0.25 Monitor platelets by anticoagulation protocol: Yes   Plan:  Continue Heparin at current rate    Amanda Fry, Bronson Curb 07/12/2018,1:42 AM

## 2018-07-13 ENCOUNTER — Encounter (HOSPITAL_COMMUNITY): Payer: Self-pay | Admitting: Interventional Radiology

## 2018-07-13 ENCOUNTER — Other Ambulatory Visit (HOSPITAL_COMMUNITY): Payer: Self-pay | Admitting: Interventional Radiology

## 2018-07-13 DIAGNOSIS — I671 Cerebral aneurysm, nonruptured: Secondary | ICD-10-CM

## 2018-07-15 ENCOUNTER — Encounter (HOSPITAL_COMMUNITY): Payer: Self-pay

## 2018-07-15 ENCOUNTER — Other Ambulatory Visit (HOSPITAL_COMMUNITY): Payer: Self-pay | Admitting: Interventional Radiology

## 2018-07-15 DIAGNOSIS — I729 Aneurysm of unspecified site: Secondary | ICD-10-CM

## 2018-07-24 DIAGNOSIS — B351 Tinea unguium: Secondary | ICD-10-CM | POA: Diagnosis not present

## 2018-07-24 DIAGNOSIS — M79672 Pain in left foot: Secondary | ICD-10-CM | POA: Diagnosis not present

## 2018-07-24 DIAGNOSIS — B353 Tinea pedis: Secondary | ICD-10-CM | POA: Diagnosis not present

## 2018-07-24 DIAGNOSIS — M722 Plantar fascial fibromatosis: Secondary | ICD-10-CM | POA: Diagnosis not present

## 2018-07-26 ENCOUNTER — Ambulatory Visit (HOSPITAL_COMMUNITY)
Admission: RE | Admit: 2018-07-26 | Discharge: 2018-07-26 | Disposition: A | Discharge status: Home / Self Care | Payer: Medicare Other | Source: Ambulatory Visit | Attending: Interventional Radiology | Admitting: Interventional Radiology

## 2018-07-26 DIAGNOSIS — I671 Cerebral aneurysm, nonruptured: Secondary | ICD-10-CM

## 2018-07-26 NOTE — Progress Notes (Signed)
Referring Physician(s): Dr. Wells Guiles Tat  Chief Complaint: The patient is seen in follow up today s/p ACOM aneurysm embolization and stenting  History of present illness: Amanda Fry is a 67 y.o. female with a past medical history of hypertension, hyperlipidemia, hypothyroidism, GERD, kidney cyst, fibromyalgia, carpal tunnel syndrome, allergic rhinitis, insomnia, and anxiety who is s/p revascularization of her proximal left ICA stenosis using stent assisted angioplasty 05/15/2018 as well as   ACOM aneurysm embolization and stenting 07/11/18. She returns to clinic today in follow-up.  Patient states she has been doing well at home although was diagnosed yesterday with a tooth abscess by her dentist.   She denies neurologic symptoms such as headache, vision changes, dizziness, tinnitus, difficulty speaking, gait abnormalities.  She has recovered well from her procedure and remains on Plavix 75 mg daily and aspirin 325 mg daily.   She requests clearance to hold her Plavix for dental procedure to be scheduled once medically safe due to recent procedures.   Past Medical History:  Diagnosis Date  . Allergic rhinitis   . Anterior communicating artery aneurysm   . Anxiety   . Carpal tunnel syndrome   . Cyst of kidney, acquired   . Esophageal reflux   . Family history of adverse reaction to anesthesia    sister N/V with anesthesia  . Fibromyalgia   . GERD (gastroesophageal reflux disease)   . Headache   . HLD (hyperlipidemia)   . Hypertension   . Hypothyroidism   . Insomnia   . Plantar fasciitis    left foot  . PONV (postoperative nausea and vomiting)     Past Surgical History:  Procedure Laterality Date  . APPENDECTOMY  2001  . BREAST LUMPECTOMY Left 1994  . CESAREAN SECTION    . IR ANGIO EXTRACRAN SEL COM CAROTID INNOMINATE UNI R MOD SED  07/11/2018  . IR ANGIO INTRA EXTRACRAN SEL COM CAROTID INNOMINATE BILAT MOD SED  05/10/2018  . IR ANGIO INTRA EXTRACRAN SEL INTERNAL  CAROTID UNI L MOD SED  07/11/2018  . IR ANGIO VERTEBRAL SEL SUBCLAVIAN INNOMINATE UNI R MOD SED  07/11/2018  . IR ANGIO VERTEBRAL SEL VERTEBRAL BILAT MOD SED  05/10/2018  . IR ANGIO VERTEBRAL SEL VERTEBRAL UNI L MOD SED  07/11/2018  . IR ANGIOGRAM FOLLOW UP STUDY  07/11/2018  . IR INTRAVSC STENT CERV CAROTID W/EMB-PROT MOD SED INCL ANGIO  05/15/2018  . IR NEURO EACH ADD'L AFTER BASIC UNI LEFT (MS)  07/11/2018  . IR TRANSCATH/EMBOLIZ  07/11/2018  . PARTIAL HYSTERECTOMY  1994  . RADIOLOGY WITH ANESTHESIA N/A 05/10/2018   Procedure: EMBOLIZATION;  Surgeon: Luanne Bras, MD;  Location: [facility];  Service: Radiology;  Laterality: N/A;  . RADIOLOGY WITH ANESTHESIA N/A 05/15/2018   Procedure: Angioplasty with stenting;  Surgeon: Luanne Bras, MD;  Location: [facility] [facility];  Service: Radiology;  Laterality: N/A;  . RADIOLOGY WITH ANESTHESIA N/A 07/11/2018   Procedure: IR WITH ANESTHESIAANEURYSM/EMBOLIZATION;  Surgeon: Luanne Bras, MD;  Location: [facility];  Service: Radiology;  Laterality: N/A;  . TONSILLECTOMY      Allergies: Clams [shellfish allergy]; Augmentin [amoxicillin-pot clavulanate]; and Biaxin [clarithromycin]  Medications: Prior to Admission medications   Medication Sig Start Date End Date Taking? Authorizing Provider  ARTIFICIAL TEAR SOLUTION OP Place 1 drop into both eyes daily as needed (dry eyes).    [provider]  aspirin EC 325 MG tablet Take 325 mg by mouth at bedtime.    [provider]  atorvastatin (LIPITOR) 40 MG  tablet Take 1 tablet (40 mg total) by mouth daily. Patient taking differently: Take 40 mg by mouth at bedtime.  08/15/17 06/27/18  Josue Hector, MD  Calcium Carb-Cholecalciferol (CALCIUM 600+D) 600-800 MG-UNIT TABS Take 1 tablet by mouth at bedtime.    [provider]  cetirizine (ZYRTEC) 10 MG tablet Take 10 mg by mouth daily as needed for allergies.     [provider]  cholecalciferol (VITAMIN D) 1000 units tablet Take  1,000 Units by mouth 2 (two) times daily.    [provider]  clopidogrel (PLAVIX) 75 MG tablet Take 75 mg by mouth at bedtime.    [provider]  Glucos-Chond-Hyal Ac-Ca Fructo (MOVE FREE JOINT HEALTH ADVANCE PO) Take 1 tablet by mouth at bedtime.     [provider]  Magnesium 500 MG CAPS Take 500 mg by mouth at bedtime.     [provider]  propranolol ER (INDERAL LA) 80 MG 24 hr capsule Take 1 capsule (80 mg total) by mouth daily. 01/24/17   Tat, Eustace Quail, DO  ranitidine (ZANTAC) 150 MG tablet Take 150 mg by mouth at bedtime.     [provider]  SUMAtriptan (IMITREX) 100 MG tablet Take 1 tablet (100 mg total) by mouth once. May repeat in 2 hours if headache persists or recurs. Not to exceed 2 per day. 03/16/16   Tat, Eustace Quail, DO  SYNTHROID 112 MCG tablet Take 112 mcg by mouth daily. 01/18/17   [provider]  zolpidem (AMBIEN) 5 MG tablet Take 5 mg at bedtime by mouth.    [provider]     Family History  Problem Relation Age of Onset  . CAD Father 78       CABG  . Cancer Mother        BREAST  . Healthy Brother   . Healthy Brother   . Diabetes Sister   . Cancer Sister        BREAST  . Healthy Son   . Healthy Daughter   . Heart disease Maternal Grandmother   . Heart disease Maternal Grandfather   . Emphysema Paternal Grandfather     Social History   Socioeconomic History  . Marital status: Married    Spouse name: Not on file  . Number of children: Not on file  . Years of education: Not on file  . Highest education level: Not on file  Occupational History  . Not on file  Social Needs  . Financial resource strain: Not on file  . Food insecurity:    Worry: Not on file    Inability: Not on file  . Transportation needs:    Medical: Not on file    Non-medical: Not on file  Tobacco Use  . Smoking status: Former Smoker    Packs/day: 0.50    Years: 10.00    Pack years: 5.00    Types: Cigarettes    Last  attempt to quit: 09/26/2009    Years since quitting: 8.8  . Smokeless tobacco: Never Used  Substance and Sexual Activity  . Alcohol use: Yes    Alcohol/week: 7.0 standard drinks    Types: 7 Glasses of wine per week    Comment: not since April 30, 2018  . Drug use: No  . Sexual activity: Not on file  Lifestyle  . Physical activity:    Days per week: Not on file    Minutes per session: Not on file  . Stress: Not on  file  Relationships  . Social connections:    Talks on phone: Not on file    Gets together: Not on file    Attends religious service: Not on file    Active member of club or organization: Not on file    Attends meetings of clubs or organizations: Not on file    Relationship status: Not on file  Other Topics Concern  . Not on file  Social History Narrative  . Not on file     Vital Signs: There were no vitals taken for this visit.  Physical Exam  NAD, alert, oriented Neuro: No focal deficits noted Skin: warm and dry  Imaging: No results found.  Labs:  CBC: Recent Labs    05/16/18 1059 07/03/18 1526 07/11/18 0728 07/12/18 0449  WBC 9.5 13.2* 4.9 11.5*  HGB 10.5* 13.0 12.8 11.2*  HCT 32.7* 41.6 40.1 34.3*  PLT 174 259 229 190    COAGS: Recent Labs    05/07/18 1100 05/15/18 0707 07/03/18 1526 07/11/18 0728  INR 1.06 1.05 1.07 1.08  APTT  --   --  29 31    BMP: Recent Labs    05/16/18 1059 07/03/18 1526 07/11/18 0728 07/12/18 0449  NA 141 141 140 140  K 3.8 4.1 3.9 3.8  CL 115* 111 107 111  CO2 21* 19* 20* 23  GLUCOSE 98 112* 154* 115*  BUN 18 18 14 11   CALCIUM 8.5* 10.0 9.9 9.0  CREATININE 0.99 0.95 1.02* 0.93  GFRNONAA 59* >60 56* >60  GFRAA >60 >60 >60 >60    LIVER FUNCTION TESTS: Recent Labs    10/17/17 0808  BILITOT 0.2  AST 27  ALT 36*  ALKPHOS 59  PROT 6.2  ALBUMIN 4.3    Assessment: Proximal left ICA stenosis s/p stent-assisted angioplasty 05/15/18 ACOM aneurysm embolization and stenting 07/11/18 Patient is  recovering well after her procedures.  She remains symptom free from a neurologic standpoint.  She continues Plavix 75 mg daily as well as 325 mg aspirin daily.  She unfortunately has developed a tooth abscess and is in need of a root canal.  She is currently on antibiotics. She requests input from Dr. Estanislado Pandy regarding holding her medication for dental procedure which is to be scheduled once medically appropriate.  Dr. Estanislado Pandy would like for the patient to remain on Plavix 75 mg daily and aspirin 325 mg daily for the next 3 weeks if possible.  If symptoms of tooth abscess adequately controlled with antibiotics and pain medication through this time, Plavix only may be held for procedure as early as November 25th. She is to continue aspirin 325 mg-- do not hold, and should resume Plavix after dental procedure. Patient voices understanding.    Signed: Docia Barrier, PA 07/26/2018, 2:06 PM    I spent a total of 25 Minutes in face to face in clinical consultation, greater than 50% of which was counseling/coordinating care for ACOM aneurysm.

## 2018-07-27 ENCOUNTER — Telehealth: Payer: Self-pay | Admitting: Student

## 2018-07-27 NOTE — Telephone Encounter (Signed)
Refill of Plavix 75 mg PO called into [facility] [place] [facility] [place].   Brynda Greathouse, MS RD PA-C 3:34 PM

## 2018-08-06 DIAGNOSIS — Z1231 Encounter for screening mammogram for malignant neoplasm of breast: Secondary | ICD-10-CM | POA: Diagnosis not present

## 2018-08-06 DIAGNOSIS — Z803 Family history of malignant neoplasm of breast: Secondary | ICD-10-CM | POA: Diagnosis not present

## 2018-08-20 ENCOUNTER — Telehealth: Payer: Self-pay | Admitting: Student

## 2018-08-20 NOTE — Progress Notes (Signed)
Prescription for Plavix 75 mg taken once daily (dispense 30 with 3 refills) faxed to [facility] in [place], [place] [phone]) at [phone].  Bea Graff Louay Myrie, PA-C 08/20/2018, 11:48 AM

## 2018-08-21 DIAGNOSIS — Z1389 Encounter for screening for other disorder: Secondary | ICD-10-CM | POA: Diagnosis not present

## 2018-08-21 DIAGNOSIS — Z0000 Encounter for general adult medical examination without abnormal findings: Secondary | ICD-10-CM | POA: Diagnosis not present

## 2018-08-21 DIAGNOSIS — E039 Hypothyroidism, unspecified: Secondary | ICD-10-CM | POA: Diagnosis not present

## 2018-08-21 DIAGNOSIS — G4700 Insomnia, unspecified: Secondary | ICD-10-CM | POA: Diagnosis not present

## 2018-08-21 DIAGNOSIS — I10 Essential (primary) hypertension: Secondary | ICD-10-CM | POA: Diagnosis not present

## 2018-08-21 DIAGNOSIS — R0683 Snoring: Secondary | ICD-10-CM | POA: Diagnosis not present

## 2018-08-21 DIAGNOSIS — E782 Mixed hyperlipidemia: Secondary | ICD-10-CM | POA: Diagnosis not present

## 2018-08-21 DIAGNOSIS — I671 Cerebral aneurysm, nonruptured: Secondary | ICD-10-CM | POA: Diagnosis not present

## 2018-08-21 DIAGNOSIS — Z1211 Encounter for screening for malignant neoplasm of colon: Secondary | ICD-10-CM | POA: Diagnosis not present

## 2018-09-03 ENCOUNTER — Other Ambulatory Visit: Payer: Self-pay | Admitting: Family Medicine

## 2018-09-03 DIAGNOSIS — E2839 Other primary ovarian failure: Secondary | ICD-10-CM

## 2018-09-05 DIAGNOSIS — M722 Plantar fascial fibromatosis: Secondary | ICD-10-CM | POA: Diagnosis not present

## 2018-09-05 DIAGNOSIS — B353 Tinea pedis: Secondary | ICD-10-CM | POA: Diagnosis not present

## 2018-09-05 DIAGNOSIS — B351 Tinea unguium: Secondary | ICD-10-CM | POA: Diagnosis not present

## 2018-09-05 DIAGNOSIS — M79672 Pain in left foot: Secondary | ICD-10-CM | POA: Diagnosis not present

## 2018-09-11 DIAGNOSIS — G4733 Obstructive sleep apnea (adult) (pediatric): Secondary | ICD-10-CM | POA: Diagnosis not present

## 2018-09-13 DIAGNOSIS — M722 Plantar fascial fibromatosis: Secondary | ICD-10-CM | POA: Diagnosis not present

## 2018-09-17 DIAGNOSIS — M722 Plantar fascial fibromatosis: Secondary | ICD-10-CM | POA: Diagnosis not present

## 2018-09-21 DIAGNOSIS — M722 Plantar fascial fibromatosis: Secondary | ICD-10-CM | POA: Diagnosis not present

## 2018-09-24 DIAGNOSIS — M722 Plantar fascial fibromatosis: Secondary | ICD-10-CM | POA: Diagnosis not present

## 2018-09-27 ENCOUNTER — Ambulatory Visit
Admission: RE | Admit: 2018-09-27 | Discharge: 2018-09-27 | Disposition: A | Discharge status: Home / Self Care | Payer: Medicare Other | Source: Ambulatory Visit | Attending: Family Medicine | Admitting: Family Medicine

## 2018-09-27 DIAGNOSIS — Z780 Asymptomatic menopausal state: Secondary | ICD-10-CM | POA: Diagnosis not present

## 2018-09-27 DIAGNOSIS — E2839 Other primary ovarian failure: Secondary | ICD-10-CM

## 2018-09-27 DIAGNOSIS — M722 Plantar fascial fibromatosis: Secondary | ICD-10-CM | POA: Diagnosis not present

## 2018-09-27 DIAGNOSIS — M8589 Other specified disorders of bone density and structure, multiple sites: Secondary | ICD-10-CM | POA: Diagnosis not present

## 2018-10-02 DIAGNOSIS — M722 Plantar fascial fibromatosis: Secondary | ICD-10-CM | POA: Diagnosis not present

## 2018-10-05 DIAGNOSIS — M722 Plantar fascial fibromatosis: Secondary | ICD-10-CM | POA: Diagnosis not present

## 2018-10-09 DIAGNOSIS — M722 Plantar fascial fibromatosis: Secondary | ICD-10-CM | POA: Diagnosis not present

## 2018-10-10 DIAGNOSIS — M8588 Other specified disorders of bone density and structure, other site: Secondary | ICD-10-CM | POA: Diagnosis not present

## 2018-10-10 DIAGNOSIS — G4700 Insomnia, unspecified: Secondary | ICD-10-CM | POA: Diagnosis not present

## 2018-10-16 DIAGNOSIS — M722 Plantar fascial fibromatosis: Secondary | ICD-10-CM | POA: Diagnosis not present

## 2018-10-23 DIAGNOSIS — M79672 Pain in left foot: Secondary | ICD-10-CM | POA: Diagnosis not present

## 2018-10-23 DIAGNOSIS — M722 Plantar fascial fibromatosis: Secondary | ICD-10-CM | POA: Diagnosis not present

## 2018-10-23 DIAGNOSIS — B353 Tinea pedis: Secondary | ICD-10-CM | POA: Diagnosis not present

## 2018-10-23 DIAGNOSIS — B351 Tinea unguium: Secondary | ICD-10-CM | POA: Diagnosis not present

## 2018-10-30 DIAGNOSIS — M722 Plantar fascial fibromatosis: Secondary | ICD-10-CM | POA: Diagnosis not present

## 2018-11-08 DIAGNOSIS — M722 Plantar fascial fibromatosis: Secondary | ICD-10-CM | POA: Diagnosis not present

## 2018-11-13 ENCOUNTER — Telehealth: Payer: Self-pay | Admitting: Student

## 2018-11-13 NOTE — Telephone Encounter (Signed)
Faxed refill request to [facility] [phone]) at [phone]- Plavix 75 mg tablets, take one tablet by mouth once daily, dispense 30 tablets with 3 refills.  Bea Graff Louk, PA-C 11/13/2018, 11:18 AM

## 2018-11-30 DIAGNOSIS — M722 Plantar fascial fibromatosis: Secondary | ICD-10-CM | POA: Diagnosis not present

## 2018-12-03 DIAGNOSIS — B353 Tinea pedis: Secondary | ICD-10-CM | POA: Diagnosis not present

## 2018-12-03 DIAGNOSIS — M722 Plantar fascial fibromatosis: Secondary | ICD-10-CM | POA: Diagnosis not present

## 2018-12-03 DIAGNOSIS — B351 Tinea unguium: Secondary | ICD-10-CM | POA: Diagnosis not present

## 2018-12-03 DIAGNOSIS — G4733 Obstructive sleep apnea (adult) (pediatric): Secondary | ICD-10-CM | POA: Diagnosis not present

## 2018-12-13 ENCOUNTER — Telehealth: Payer: Self-pay | Admitting: Student

## 2018-12-13 NOTE — Telephone Encounter (Signed)
Refill of Plavix 75 mg PO daily called into preferred pharmacy.   Brynda Greathouse, MS RD PA-C

## 2019-01-11 ENCOUNTER — Telehealth: Payer: Self-pay | Admitting: Student

## 2019-01-11 NOTE — Telephone Encounter (Signed)
Refill for Plavix 75 mg PO daily called into [facility].   Brynda Greathouse, MS RD PA-C

## 2019-01-16 ENCOUNTER — Telehealth (HOSPITAL_COMMUNITY): Payer: Self-pay | Admitting: Radiology

## 2019-01-16 NOTE — Telephone Encounter (Signed)
Patient called and wants to know when and if she can stop Plavix. I told her that decision would be made after she has her follow-up MRI/MRA. She will call us to schedule on or after June 1st. Patient agrees with this plan. JM

## 2019-01-17 ENCOUNTER — Telehealth: Payer: Self-pay | Admitting: Radiology

## 2019-01-17 NOTE — Telephone Encounter (Signed)
1 refill for Plavix 75 mg p.o., once daily, #30 [facility]

## 2019-02-08 ENCOUNTER — Telehealth: Payer: Self-pay | Admitting: Student

## 2019-02-08 NOTE — Progress Notes (Signed)
Request for refill of Plavix received.  Confirmed with pharmacy, patient has active prescription with refill remaining to used at this time.    Brynda Greathouse, MS RD PA-C

## 2019-02-19 DIAGNOSIS — B351 Tinea unguium: Secondary | ICD-10-CM | POA: Diagnosis not present

## 2019-02-19 DIAGNOSIS — M722 Plantar fascial fibromatosis: Secondary | ICD-10-CM | POA: Diagnosis not present

## 2019-02-19 DIAGNOSIS — B353 Tinea pedis: Secondary | ICD-10-CM | POA: Diagnosis not present

## 2019-03-21 DIAGNOSIS — G4700 Insomnia, unspecified: Secondary | ICD-10-CM | POA: Diagnosis not present

## 2019-03-21 DIAGNOSIS — E782 Mixed hyperlipidemia: Secondary | ICD-10-CM | POA: Diagnosis not present

## 2019-03-21 DIAGNOSIS — E039 Hypothyroidism, unspecified: Secondary | ICD-10-CM | POA: Diagnosis not present

## 2019-03-21 DIAGNOSIS — K219 Gastro-esophageal reflux disease without esophagitis: Secondary | ICD-10-CM | POA: Diagnosis not present

## 2019-03-21 DIAGNOSIS — I10 Essential (primary) hypertension: Secondary | ICD-10-CM | POA: Diagnosis not present

## 2019-03-26 ENCOUNTER — Telehealth (HOSPITAL_COMMUNITY): Payer: Self-pay | Admitting: Student

## 2019-03-26 ENCOUNTER — Other Ambulatory Visit (HOSPITAL_COMMUNITY): Payer: Self-pay | Admitting: Student

## 2019-03-26 ENCOUNTER — Other Ambulatory Visit (HOSPITAL_COMMUNITY): Payer: Self-pay | Admitting: Interventional Radiology

## 2019-03-26 DIAGNOSIS — I729 Aneurysm of unspecified site: Secondary | ICD-10-CM

## 2019-03-26 MED ORDER — DIAZEPAM 5 MG PO TABS: 5.0000 mg | ORAL_TABLET | ORAL | 0 refills | Status: DC | PRN

## 2019-03-26 NOTE — Telephone Encounter (Signed)
Received request for anxiety premedications for MRI due to claustrophobia.  E-prescribed Valium 5 mg tablets (take one tablet by mouth 30 minutes prior to MRI, take an additional tablet by mouth 1 hour after prior if desired effects are not achieved, dispense 2 tablets with 0 refills) to patient pharmacy.  Bea Graff Sharice Harriss, PA-C 03/26/2019, 10:34 AM

## 2019-03-27 DIAGNOSIS — I10 Essential (primary) hypertension: Secondary | ICD-10-CM | POA: Diagnosis not present

## 2019-03-27 DIAGNOSIS — Z23 Encounter for immunization: Secondary | ICD-10-CM | POA: Diagnosis not present

## 2019-04-02 DIAGNOSIS — M722 Plantar fascial fibromatosis: Secondary | ICD-10-CM | POA: Diagnosis not present

## 2019-04-02 DIAGNOSIS — B353 Tinea pedis: Secondary | ICD-10-CM | POA: Diagnosis not present

## 2019-04-02 DIAGNOSIS — B351 Tinea unguium: Secondary | ICD-10-CM | POA: Diagnosis not present

## 2019-04-17 ENCOUNTER — Ambulatory Visit (HOSPITAL_COMMUNITY)
Admission: RE | Admit: 2019-04-17 | Discharge: 2019-04-17 | Disposition: A | Discharge status: Home / Self Care | Payer: Medicare Other | Source: Ambulatory Visit | Attending: Interventional Radiology | Admitting: Interventional Radiology

## 2019-04-17 ENCOUNTER — Other Ambulatory Visit: Payer: Self-pay

## 2019-04-17 DIAGNOSIS — I729 Aneurysm of unspecified site: Secondary | ICD-10-CM

## 2019-04-17 DIAGNOSIS — I671 Cerebral aneurysm, nonruptured: Secondary | ICD-10-CM | POA: Insufficient documentation

## 2019-04-22 ENCOUNTER — Telehealth (HOSPITAL_COMMUNITY): Payer: Self-pay

## 2019-04-22 ENCOUNTER — Telehealth: Payer: Self-pay | Admitting: Student

## 2019-04-22 NOTE — Telephone Encounter (Signed)
Pt agreed to f/u in 6 months with angiogram. She wants to know if she can d/c Plavix. I will forward message to our PA and get back with her. Pt agreed. AW

## 2019-04-22 NOTE — Telephone Encounter (Signed)
Left message to f/u in 6 months and Dr. Estanislado Pandy will discuss stopping blood thinner during next f/u. Call with any questions. AW

## 2019-04-22 NOTE — Telephone Encounter (Signed)
Received prescription refill request from patient. She is currently taking Plavix 75 mg once daily.  [facility]ed [facility] in [place], [place] [phone]) at [phone] to fill prescription- Plavix 75 mg tablets, take one tablet by mouth once daily, dispense 30 tablets with 3 refills.   Bea Graff Heela Heishman, PA-C 04/22/2019, 11:20 AM

## 2019-06-04 DIAGNOSIS — B353 Tinea pedis: Secondary | ICD-10-CM | POA: Diagnosis not present

## 2019-06-04 DIAGNOSIS — M722 Plantar fascial fibromatosis: Secondary | ICD-10-CM | POA: Diagnosis not present

## 2019-06-04 DIAGNOSIS — B351 Tinea unguium: Secondary | ICD-10-CM | POA: Diagnosis not present

## 2019-07-15 DIAGNOSIS — E039 Hypothyroidism, unspecified: Secondary | ICD-10-CM | POA: Diagnosis not present

## 2019-07-15 DIAGNOSIS — Z23 Encounter for immunization: Secondary | ICD-10-CM | POA: Diagnosis not present

## 2019-07-17 ENCOUNTER — Telehealth: Payer: Self-pay | Admitting: Student

## 2019-07-17 NOTE — Telephone Encounter (Signed)
NIR.  Received fax prescription refill request for Plavix. Faxed filled [facility] to [facility] in [place], [place] [phone]) at [phone]- Plavix 75 mg tablets, take one tablet by mouth once daily, dispense 30 tablets with 3 refills.   Bea Graff Louk, PA-C 07/17/2019, 10:23 AM

## 2019-07-23 DIAGNOSIS — E039 Hypothyroidism, unspecified: Secondary | ICD-10-CM | POA: Diagnosis not present

## 2019-07-23 DIAGNOSIS — E782 Mixed hyperlipidemia: Secondary | ICD-10-CM | POA: Diagnosis not present

## 2019-07-23 DIAGNOSIS — I10 Essential (primary) hypertension: Secondary | ICD-10-CM | POA: Diagnosis not present

## 2019-07-24 ENCOUNTER — Telehealth: Payer: Self-pay | Admitting: Student

## 2019-07-24 NOTE — Telephone Encounter (Signed)
Received 2nd refill request for Plavix 75mg .   Spoke with pharmacist, faxed refill 10/21 was not received.  Refill of Plavix 75mg  daily, #30 with 3 refills called into pharmacy.   Brynda Greathouse, MS RD PA-C

## 2019-08-08 DIAGNOSIS — Z1231 Encounter for screening mammogram for malignant neoplasm of breast: Secondary | ICD-10-CM | POA: Diagnosis not present

## 2019-08-08 DIAGNOSIS — Z803 Family history of malignant neoplasm of breast: Secondary | ICD-10-CM | POA: Diagnosis not present

## 2019-09-23 ENCOUNTER — Other Ambulatory Visit: Payer: Self-pay

## 2019-09-23 ENCOUNTER — Encounter: Payer: Self-pay | Admitting: Family Medicine

## 2019-09-23 ENCOUNTER — Ambulatory Visit (INDEPENDENT_AMBULATORY_CARE_PROVIDER_SITE_OTHER): Payer: Medicare Other | Admitting: Family Medicine

## 2019-09-23 VITALS — BP 124/82 | HR 77 | Ht 68.0 in | Wt 228.0 lb

## 2019-09-23 DIAGNOSIS — M79604 Pain in right leg: Secondary | ICD-10-CM

## 2019-09-23 DIAGNOSIS — M5416 Radiculopathy, lumbar region: Secondary | ICD-10-CM | POA: Diagnosis not present

## 2019-09-23 MED ORDER — GABAPENTIN 300 MG PO CAPS: 300.0000 mg | ORAL_CAPSULE | Freq: Every evening | ORAL | 1 refills | Status: DC | PRN

## 2019-09-23 NOTE — Patient Instructions (Addendum)
Thank you for coming in today. Get xray of your back and leg today.  Attend PT.  Take gabapentin at bedtime for nerve pain.  Ok to also use over the counter voltaren gel for your hand.   Recheck with me in 4 weeks.    Radicular Pain Radicular pain is a type of pain that spreads from your back or neck along a spinal nerve. Spinal nerves are nerves that leave the spinal cord and go to the muscles. Radicular pain is sometimes called radiculopathy, radiculitis, or a pinched nerve. When you have this type of pain, you may also have weakness, numbness, or tingling in the area of your body that is supplied by the nerve. The pain may feel sharp and burning. Depending on which spinal nerve is affected, the pain may occur in the:  Neck area (cervical radicular pain). You may also feel pain, numbness, weakness, or tingling in the arms.  Mid-spine area (thoracic radicular pain). You would feel this pain in the back and chest. This type is rare.  Lower back area (lumbar radicular pain). You would feel this pain as low back pain. You may feel pain, numbness, weakness, or tingling in the buttocks or legs. Sciatica is a type of lumbar radicular pain that shoots down the back of the leg. Radicular pain occurs when one of the spinal nerves becomes irritated or squeezed (compressed). It is often caused by something pushing on a spinal nerve, such as one of the bones of the spine (vertebrae) or one of the round cushions between vertebrae (intervertebral disks). This can result from:  An injury.  Wear and tear or aging of a disk.  The growth of a bone spur that pushes on the nerve. Radicular pain often goes away when you follow instructions from your health care provider for relieving pain at home. Follow these instructions at home: Managing pain      If directed, put ice on the affected area: ? Put ice in a plastic bag. ? Place a towel between your skin and the bag. ? Leave the ice on for 20 minutes,  2-3 times a day.  If directed, apply heat to the affected area as often as told by your health care provider. Use the heat source that your health care provider recommends, such as a moist heat pack or a heating pad. ? Place a towel between your skin and the heat source. ? Leave the heat on for 20-30 minutes. ? Remove the heat if your skin turns bright red. This is especially important if you are unable to feel pain, heat, or cold. You may have a greater risk of getting burned. Activity   Do not sit or rest in bed for long periods of time.  Try to stay as active as possible. Ask your health care provider what type of exercise or activity is best for you.  Avoid activities that make your pain worse, such as bending and lifting.  Do not lift anything that is heavier than 10 lb (4.5 kg), or the limit that you are told, until your health care provider says that it is safe.  Practice using proper technique when lifting items. Proper lifting technique involves bending your knees and rising up.  Do strength and range-of-motion exercises only as told by your health care provider or physical therapist. General instructions  Take over-the-counter and prescription medicines only as told by your health care provider.  Pay attention to any changes in your symptoms.  Keep all  follow-up visits as told by your health care provider. This is important. ? Your health care provider may send you to a physical therapist to help with this pain. Contact a health care provider if:  Your pain and other symptoms get worse.  Your pain medicine is not helping.  Your pain has not improved after a few weeks of home care.  You have a fever. Get help right away if:  You have severe pain, weakness, or numbness.  You have difficulty with bladder or bowel control. Summary  Radicular pain is a type of pain that spreads from your back or neck along a spinal nerve.  When you have radicular pain, you may also  have weakness, numbness, or tingling in the area of your body that is supplied by the nerve.  The pain may feel sharp or burning.  Radicular pain may be treated with ice, heat, medicines, or physical therapy. This information is not intended to replace advice given to you by your health care provider. Make sure you discuss any questions you have with your health care provider. Document Released: 10/20/2004 Document Revised: 03/27/2018 Document Reviewed: 03/27/2018 Elsevier Patient Education  2020 [employer].

## 2019-09-23 NOTE — Progress Notes (Signed)
Subjective:   I, Amanda Fry, am serving as a scribe for Dr. Lynne Leader.  CC: Left lower leg pain.  HPI: C/O L shin pain that comes and goes X1 year that radiates up her leg. Hurts worse when standing a long time or sitting a long time. Tried tylenol and a topical oil for pain at bedtime, waking up first thing in the morning when stretching legs. States was told by her PCP that it could be shin splints.  Pain located at anterior right shin.  Pain is worse with prolonged standing.  Additionally pain will occur with prolonged sitting and will wake her up from sleep occasionally.  She denies any injury.  She denies any pain into her lower leg or foot.  No fevers chills nausea vomiting or diarrhea.  No pain immediately worse with exertion better with rest.  Additionally she notes some pain and tingling sensation into her right hand.  This typically will occur with sleep as well and is located at her ulnar 3 fingers and sometimes on the dorsal aspect of her hand.  She denies any injury or change with activity with her right hand.   Past medical history, Surgical history, Family history not pertinant except as noted below, Social history, Allergies, and medications have been entered into the medical record, reviewed, and no changes needed.   Review of Systems: No headache, visual changes, nausea, vomiting, diarrhea, constipation, dizziness, abdominal pain, skin rash, fevers, chills, night sweats, weight loss, swollen lymph nodes, body aches, joint swelling, muscle aches, chest pain, shortness of breath, mood changes, visual or auditory hallucinations.   Objective:    Vitals:   09/23/19 0817  BP: 124/82  Pulse: 77  SpO2: 100%   General: Well Developed, well nourished, and in no acute distress.  Neuro/Psych: Alert and oriented x3, extra-ocular muscles intact, able to move all 4 extremities, sensation grossly intact. Skin: Warm and dry, no rashes noted.  Respiratory: Not using  accessory muscles, speaking in full sentences, trachea midline.  Cardiovascular: Pulses palpable, no extremity edema. Abdomen: Does not appear distended. MSK:  C-spine: Nontender to spinal midline normal cervical motion. Right hand normal-appearing normal motion nontender negative Tinel's at wrist and carpal tunnel.  Positive Phalen's test. Grip strength pulses cap refill and sensation are intact distally.  L-spine: Nontender to spinal midline normal lumbar motion. Lower extremity strength is equal normal throughout. Reflexes equal normal throughout bilateral lower extremities. Negative slump test.  Right knee and lower leg normal-appearing nontender normal motion normal strength.  Pulses cap refill and sensation are intact in the foot.  Lab and Radiology Results X-ray L-spine and right tib-fib ordered today.  X-ray currently unavailable will be performed at outside location.  Impression and Recommendations:    Assessment and Plan: 68 y.o. female with right leg pain ongoing for about a year.  Etiology is somewhat unclear however pain is most likely L5 radiculopathy.  Plan to treat with trial of gabapentin and physical therapy.  Will obtain x-rays of L-spine and tib-fib.  Recheck in 4 weeks or so.  Additionally patient has some paresthesias and pain to her right hand.  This does not correspond to a typical specific nerve root or peripheral nerve.  Again plan for trial of gabapentin.  Recheck in 4 to 6 weeks or so.   Orders Placed This Encounter  Procedures  . XR Tib-Fib Right    Standing Status:   Future    Standing Expiration Date:   11/22/2020  Order Specific Question:   Preferred imaging location?    Answer:   [facility]    Order Specific Question:   Reason for exam:    Answer:   Eval right leg pain    Order Specific Question:   Release to patient    Answer:   Immediate  . XR Lumbar Spine    Please include AP, Lateral, obliques, and lumbosacral spot views.     Standing Status:   Future    Standing Expiration Date:   11/22/2020    Order Specific Question:   Preferred imaging location?    Answer:   [facility]    Order Specific Question:   Reason for exam:    Answer:   Please include AP, Lateral, obliques, and lumbosacral spot views.    Comments:   Please include AP, Lateral, obliques, and lumbosacral spot views.  . Ambulatory referral to Physical Therapy    Referral Priority:   Routine    Referral Type:   Physical Medicine    Referral Reason:   Specialty Services Required    Requested Specialty:   Physical Therapy   Meds ordered this encounter  Medications  . gabapentin (NEURONTIN) 300 MG capsule    Sig: Take 1 capsule (300 mg total) by mouth at bedtime as needed. For nerve pain    Dispense:  90 capsule    Refill:  1    Discussed warning signs or symptoms. Please see discharge instructions. Patient expresses understanding.   The above documentation has been reviewed and is accurate and complete Lynne Leader

## 2019-10-15 ENCOUNTER — Ambulatory Visit: Payer: Medicare Other | Attending: Family Medicine | Admitting: Physical Therapy

## 2019-10-15 ENCOUNTER — Other Ambulatory Visit: Payer: Self-pay

## 2019-10-15 ENCOUNTER — Encounter: Payer: Self-pay | Admitting: Physical Therapy

## 2019-10-15 DIAGNOSIS — M25571 Pain in right ankle and joints of right foot: Secondary | ICD-10-CM | POA: Insufficient documentation

## 2019-10-15 DIAGNOSIS — R252 Cramp and spasm: Secondary | ICD-10-CM | POA: Insufficient documentation

## 2019-10-15 DIAGNOSIS — R262 Difficulty in walking, not elsewhere classified: Secondary | ICD-10-CM | POA: Diagnosis not present

## 2019-10-15 NOTE — Therapy (Signed)
[address] [place], [place], [address] Phone: [phone]   Fax:  [phone]  Physical Therapy Evaluation  Patient Details  Name: Amanda Fry MRN: [id number] Date of Birth: [birth year] Referring Provider (PT): Corye   Encounter Date: 10/15/2019  PT End of Session - 10/15/19 1502    Visit Number  1    Date for PT Re-Evaluation  12/13/19    PT Start Time  1430    PT Stop Time  1525    PT Time Calculation (min)  55 min    Activity Tolerance  Patient tolerated treatment well    Behavior During Therapy  [facility] for tasks assessed/performed       Past Medical History:  Diagnosis Date  . Allergic rhinitis   . Anterior communicating artery aneurysm   . Anxiety   . Carpal tunnel syndrome   . Cyst of kidney, acquired   . Esophageal reflux   . Family history of adverse reaction to anesthesia    sister N/V with anesthesia  . Fibromyalgia   . GERD (gastroesophageal reflux disease)   . Headache   . HLD (hyperlipidemia)   . Hypertension   . Hypothyroidism   . Insomnia   . Plantar fasciitis    left foot  . PONV (postoperative nausea and vomiting)     Past Surgical History:  Procedure Laterality Date  . APPENDECTOMY  2001  . BREAST LUMPECTOMY Left 1994  . CESAREAN SECTION    . IR ANGIO EXTRACRAN SEL COM CAROTID INNOMINATE UNI R MOD SED  07/11/2018  . IR ANGIO INTRA EXTRACRAN SEL COM CAROTID INNOMINATE BILAT MOD SED  05/10/2018  . IR ANGIO INTRA EXTRACRAN SEL INTERNAL CAROTID UNI L MOD SED  07/11/2018  . IR ANGIO VERTEBRAL SEL SUBCLAVIAN INNOMINATE UNI R MOD SED  07/11/2018  . IR ANGIO VERTEBRAL SEL VERTEBRAL BILAT MOD SED  05/10/2018  . IR ANGIO VERTEBRAL SEL VERTEBRAL UNI L MOD SED  07/11/2018  . IR ANGIOGRAM FOLLOW UP STUDY  07/11/2018  . IR INTRAVSC STENT CERV CAROTID W/EMB-PROT MOD SED INCL ANGIO  05/15/2018  . IR NEURO EACH ADD'L AFTER BASIC UNI LEFT (MS)  07/11/2018  . IR TRANSCATH/EMBOLIZ   07/11/2018  . PARTIAL HYSTERECTOMY  1994  . RADIOLOGY WITH ANESTHESIA N/A 05/10/2018   Procedure: EMBOLIZATION;  Surgeon: Luanne Bras, MD;  Location: [place];  Service: Radiology;  Laterality: N/A;  . RADIOLOGY WITH ANESTHESIA N/A 05/15/2018   Procedure: Angioplasty with stenting;  Surgeon: Luanne Bras, MD;  Location: [facility];  Service: Radiology;  Laterality: N/A;  . RADIOLOGY WITH ANESTHESIA N/A 07/11/2018   Procedure: IR WITH ANESTHESIAANEURYSM/EMBOLIZATION;  Surgeon: Luanne Bras, MD;  Location: [facility];  Service: Radiology;  Laterality: N/A;  . TONSILLECTOMY      There were no vitals filed for this visit.   Subjective Assessment - 10/15/19 1436    Subjective  Patient reports that over the past year she has had increased right lateral shin pain, also some back pain, she c/o mostly the shin pain, she has been ordered to have x-rays but they have not been performed yet.  She thinks that some of this is due to less activity due to covid.  She does have a history of aneurysm in the brain.    Pertinent History  GERD, aneurysm, fibromyalgia    How long can you walk comfortably?  reports pain with shopping    Patient Stated Goals  have less  pain, sleep better    Currently in Pain?  Yes    Pain Score  6     Pain Location  Leg    Pain Orientation  Right;Lower;Lateral    Pain Descriptors / Indicators  Aching    Pain Type  Acute pain    Pain Onset  More than a month ago    Pain Frequency  Constant    Aggravating Factors   worse at night, worse with shopping, up to 8/10, reports feels very jittery at night    Pain Relieving Factors  essential oils, brace, at its best pain can be a 2/10    Effect of Pain on Daily Activities  really difficulty sleeping         [facility] PT Assessment - 10/15/19 0001      Assessment   Medical Diagnosis  right lower leg pain, back pain    Referring Provider (PT)  Corye    Onset Date/Surgical Date  09/14/19    Prior Therapy  no      Precautions    Precautions  None      Balance Screen   Has the patient fallen in the past 6 months  No    Has the patient had a decrease in activity level because of a fear of falling?   No    Is the patient reluctant to leave their home because of a fear of falling?   No      Home Environment   Additional Comments  does housework and yardwork, no stairs      Prior Function   Level of Independence  Independent    Vocation  Retired    Leisure  no exercise      Posture/Postural Control   Posture Comments  fwd head, rounded shoulders      ROM / Strength   AROM / PROM / Strength  AROM;Strength      AROM   Overall AROM Comments  Lumbar ROM WFL's with some left low back pain, ankle ROM WFL's      Strength   Overall Strength Comments  hips 4-/5, ankles 4-/5 with some c/o pain in the calf and the anterior tibialis mm      Flexibility   Soft Tissue Assessment /Muscle Length  yes    Hamstrings  mild tightness    ITB  tight    Piriformis  mild tightness      Palpation   Palpation comment  very tender and tight in the right anterior tibialis and the peroneal mms, very tender      Ambulation/Gait   Gait Comments  mild antalgic on the right                Objective measurements completed on examination: See above findings.      [facility] Adult PT Treatment/Exercise - 10/15/19 0001      Modalities   Modalities  Electrical Stimulation;Moist Heat      Moist Heat Therapy   Number Minutes Moist Heat  10 Minutes    Moist Heat Location  Ankle      Electrical Stimulation   Electrical Stimulation Location  right anterior tib and peroneal area    Electrical Stimulation Action  IFC    Electrical Stimulation Parameters  supine with leg elevated    Electrical Stimulation Goals  Pain                  PT Long Term Goals - 10/15/19 1505  PT LONG TERM GOAL #1   Title  independent with HEP    Time  8    Period  Weeks    Status  New      PT LONG TERM GOAL #2   Title   report 50% better sleeping    Time  8    Period  Weeks    Status  New      PT LONG TERM GOAL #3   Title  report 50% less pain    Time  8    Period  Weeks    Status  New      PT LONG TERM GOAL #4   Title  walk without limp    Time  8    Period  Weeks    Status  New             Plan - 10/15/19 1502    Clinical Impression Statement  Patient has right shin  and lateral shin pain, she is very tender here, has mild weakness, good ROM, has some issues with the low back, she does report battling left PF for the past year, so am unsure that she may have favored the leg.  Just real tender and c/o mostly at night    Stability/Clinical Decision Making  Evolving/Moderate complexity    Clinical Decision Making  Low    Rehab Potential  Good    PT Frequency  2x / week    PT Duration  8 weeks    PT Treatment/Interventions  ADLs/Self Care Home Management;Cryotherapy;Electrical Stimulation;Iontophoresis 4mg /ml Dexamethasone;Moist Heat;Ultrasound;Traction;Therapeutic activities;Gait training;Therapeutic exercise;Balance training;Neuromuscular re-education;Manual techniques;Dry needling;Patient/family education    PT Next Visit Plan  slowly start some exercises may do some STM    Consulted and Agree with Plan of Care  Patient       Patient will benefit from skilled therapeutic intervention in order to improve the following deficits and impairments:  Abnormal gait, Pain, Increased muscle spasms, Decreased range of motion, Decreased strength, Impaired flexibility, Difficulty walking  Visit Diagnosis: Pain in right ankle and joints of right foot - Plan: PT plan of care cert/re-cert  Cramp and spasm - Plan: PT plan of care cert/re-cert  Difficulty in walking, not elsewhere classified - Plan: PT plan of care cert/re-cert     Problem List Patient Active Problem List   Diagnosis Date Noted  . Brain aneurysm 07/11/2018  . Carotid stenosis, asymptomatic, unspecified laterality 05/15/2018     Sumner Boast., PT 10/15/2019, 3:07 PM  [address] [place], [place], [address] Phone: [phone]   Fax:  [phone]  Name: Amanda Fry MRN: [id number] Date of Birth: 11-30-1950

## 2019-10-17 ENCOUNTER — Other Ambulatory Visit: Payer: Self-pay

## 2019-10-17 ENCOUNTER — Telehealth (HOSPITAL_COMMUNITY): Payer: Self-pay

## 2019-10-17 ENCOUNTER — Ambulatory Visit: Payer: Medicare Other | Admitting: Physical Therapy

## 2019-10-17 ENCOUNTER — Telehealth: Payer: Self-pay | Admitting: Student

## 2019-10-17 DIAGNOSIS — M25571 Pain in right ankle and joints of right foot: Secondary | ICD-10-CM | POA: Diagnosis not present

## 2019-10-17 DIAGNOSIS — R252 Cramp and spasm: Secondary | ICD-10-CM | POA: Diagnosis not present

## 2019-10-17 DIAGNOSIS — R262 Difficulty in walking, not elsewhere classified: Secondary | ICD-10-CM

## 2019-10-17 NOTE — Telephone Encounter (Signed)
Patient refill for Plavix 75mg  called into preferred pharmacy of [place].  Brynda Greathouse, MS RD PA-C 10:26 AM

## 2019-10-17 NOTE — Telephone Encounter (Signed)
Called to schedule f/u angiogram, no answer, left vm. AW 

## 2019-10-17 NOTE — Therapy (Signed)
[place], [place], [address] Phone: [phone]   Fax:  [phone]  Physical Therapy Treatment  Patient Details  Name: Amanda Fry MRN: [id number] Date of Birth: 26-Jul-1951 Referring Provider (PT): Corye   Encounter Date: 10/17/2019  PT End of Session - 10/17/19 1122    Visit Number  2    Date for PT Re-Evaluation  12/13/19    PT Start Time  1050    PT Stop Time  1140    PT Time Calculation (min)  50 min       Past Medical History:  Diagnosis Date  . Allergic rhinitis   . Anterior communicating artery aneurysm   . Anxiety   . Carpal tunnel syndrome   . Cyst of kidney, acquired   . Esophageal reflux   . Family history of adverse reaction to anesthesia    sister N/V with anesthesia  . Fibromyalgia   . GERD (gastroesophageal reflux disease)   . Headache   . HLD (hyperlipidemia)   . Hypertension   . Hypothyroidism   . Insomnia   . Plantar fasciitis    left foot  . PONV (postoperative nausea and vomiting)     Past Surgical History:  Procedure Laterality Date  . APPENDECTOMY  2001  . BREAST LUMPECTOMY Left 1994  . CESAREAN SECTION    . IR ANGIO EXTRACRAN SEL COM CAROTID INNOMINATE UNI R MOD SED  07/11/2018  . IR ANGIO INTRA EXTRACRAN SEL COM CAROTID INNOMINATE BILAT MOD SED  05/10/2018  . IR ANGIO INTRA EXTRACRAN SEL INTERNAL CAROTID UNI L MOD SED  07/11/2018  . IR ANGIO VERTEBRAL SEL SUBCLAVIAN INNOMINATE UNI R MOD SED  07/11/2018  . IR ANGIO VERTEBRAL SEL VERTEBRAL BILAT MOD SED  05/10/2018  . IR ANGIO VERTEBRAL SEL VERTEBRAL UNI L MOD SED  07/11/2018  . IR ANGIOGRAM FOLLOW UP STUDY  07/11/2018  . IR INTRAVSC STENT CERV CAROTID W/EMB-PROT MOD SED INCL ANGIO  05/15/2018  . IR NEURO EACH ADD'L AFTER BASIC UNI LEFT (MS)  07/11/2018  . IR TRANSCATH/EMBOLIZ  07/11/2018  . PARTIAL HYSTERECTOMY  1994  . RADIOLOGY WITH ANESTHESIA N/A 05/10/2018   Procedure: EMBOLIZATION;   Surgeon: Luanne Bras, MD;  Location: [facility];  Service: Radiology;  Laterality: N/A;  . RADIOLOGY WITH ANESTHESIA N/A 05/15/2018   Procedure: Angioplasty with stenting;  Surgeon: Luanne Bras, MD;  Location: [place];  Service: Radiology;  Laterality: N/A;  . RADIOLOGY WITH ANESTHESIA N/A 07/11/2018   Procedure: IR WITH ANESTHESIAANEURYSM/EMBOLIZATION;  Surgeon: Luanne Bras, MD;  Location: [facility];  Service: Radiology;  Laterality: N/A;  . TONSILLECTOMY      There were no vitals filed for this visit.  Subjective Assessment - 10/17/19 1049    Subjective  "i hurt after estim but then pain went away- I think it helped"    Currently in Pain?  No/denies                       [facility] Adult PT Treatment/Exercise - 10/17/19 0001      Exercises   Exercises  Knee/Hip;Ankle      Knee/Hip Exercises: Stretches   Piriformis Stretch  Both;3 reps;10 seconds      Knee/Hip Exercises: Aerobic   Nustep  L 4 5 min   no pian but verb felt "sensation" in RT leg     Knee/Hip Exercises: Standing   Heel Raises  Both;15 reps  black bar   Heel Raises Limitations  toe raises on black bar 15 x    Hip Flexion  Both;Stengthening;10 reps;Knee straight   red tband   Hip ADduction  Strengthening;Both;10 reps   red tband   Hip Extension  Stengthening;Both;10 reps;Knee straight   red tband   Lateral Step Up  Both;10 reps;Hand Hold: 2;Step Height: 6"   opp leg abd   Forward Step Up  Both;10 reps;Hand Hold: 2;Step Height: 6"   opp leg ext     Modalities   Modalities  Electrical Stimulation;Moist Heat      Moist Heat Therapy   Number Minutes Moist Heat  12 Minutes    Moist Heat Location  Ankle      Electrical Stimulation   Electrical Stimulation Location  right anterior tib and peroneal area    Electrical Stimulation Action  IFC    Electrical Stimulation Parameters  supine with leg elevated    Electrical Stimulation Goals  Pain      Ankle Exercises: Supine   Other Supine  Ankle Exercises  red tband 15x ankle 4 way             PT Education - 10/17/19 1119    Education Details  SL KTC, DKTC, trunk rotation, piriformis, ppt    Person(s) Educated  Patient    Methods  Explanation;Demonstration;Verbal cues;Handout    Comprehension  Verbalized understanding;Returned demonstration          PT Long Term Goals - 10/15/19 1505      PT LONG TERM GOAL #1   Title  independent with HEP    Time  8    Period  Weeks    Status  New      PT LONG TERM GOAL #2   Title  report 50% better sleeping    Time  8    Period  Weeks    Status  New      PT LONG TERM GOAL #3   Title  report 50% less pain    Time  8    Period  Weeks    Status  New      PT LONG TERM GOAL #4   Title  walk without limp    Time  8    Period  Weeks    Status  New            Plan - 10/17/19 1123    Clinical Impression Statement  various pain with ex in foot /ankle and hips- tenporary and happenes with mvmt and fatigue. pt very tight thru lumbar and esp piriformis. verb and tactile cuing needed with ther and stretches    PT Treatment/Interventions  ADLs/Self Care Home Management;Cryotherapy;Electrical Stimulation;Iontophoresis 4mg /ml Dexamethasone;Moist Heat;Ultrasound;Traction;Therapeutic activities;Gait training;Therapeutic exercise;Balance training;Neuromuscular re-education;Manual techniques;Dry needling;Patient/family education    PT Next Visit Plan  asses how ex felt and add to HEP, check HEP fof stretches, could try STW       Patient will benefit from skilled therapeutic intervention in order to improve the following deficits and impairments:  Abnormal gait, Pain, Increased muscle spasms, Decreased range of motion, Decreased strength, Impaired flexibility, Difficulty walking  Visit Diagnosis: Pain in right ankle and joints of right foot  Cramp and spasm  Difficulty in walking, not elsewhere classified     Problem List Patient Active Problem List   Diagnosis  Date Noted  . Brain aneurysm 07/11/2018  . Carotid stenosis, asymptomatic, unspecified laterality 05/15/2018    [name],[name] PTA 10/17/2019, 11:25 AM  [address] [place] [address] [place], [place], [address] Phone: [phone]   Fax:  [phone]  Name: Amanda Fry MRN: [id number] Date of Birth: 04-Sep-1951

## 2019-10-21 ENCOUNTER — Ambulatory Visit (INDEPENDENT_AMBULATORY_CARE_PROVIDER_SITE_OTHER): Payer: Medicare Other

## 2019-10-21 ENCOUNTER — Ambulatory Visit (INDEPENDENT_AMBULATORY_CARE_PROVIDER_SITE_OTHER): Payer: Medicare Other | Admitting: Family Medicine

## 2019-10-21 ENCOUNTER — Other Ambulatory Visit: Payer: Self-pay

## 2019-10-21 ENCOUNTER — Encounter: Payer: Self-pay | Admitting: Family Medicine

## 2019-10-21 VITALS — BP 142/92 | HR 76 | Ht 68.0 in | Wt 231.6 lb

## 2019-10-21 DIAGNOSIS — M5416 Radiculopathy, lumbar region: Secondary | ICD-10-CM

## 2019-10-21 DIAGNOSIS — M79604 Pain in right leg: Secondary | ICD-10-CM | POA: Diagnosis not present

## 2019-10-21 DIAGNOSIS — G2581 Restless legs syndrome: Secondary | ICD-10-CM

## 2019-10-21 DIAGNOSIS — M545 Low back pain: Secondary | ICD-10-CM | POA: Diagnosis not present

## 2019-10-21 DIAGNOSIS — M79661 Pain in right lower leg: Secondary | ICD-10-CM | POA: Diagnosis not present

## 2019-10-21 MED ORDER — ROPINIROLE HCL 0.25 MG PO TABS: 0.2500 mg | ORAL_TABLET | Freq: Every day | ORAL | 1 refills | Status: DC

## 2019-10-21 NOTE — Progress Notes (Signed)
X-ray L-spine shows arthritis and that transitional vertebrae we talked about.  No fractures.

## 2019-10-21 NOTE — Progress Notes (Signed)
X-ray right lower leg shows mild arthritis in the knee but otherwise lower leg is normal-appearing.

## 2019-10-21 NOTE — Progress Notes (Signed)
I, Amanda Fry, LAT, ATC, am serving as scribe for Dr. Lynne Leader.  Amanda Fry is a 69 y.o. female who presents to [place] at [facility] today for f/u of R lower leg pain x approximately one year.  She was last seen by Dr. Georgina Snell on 09/23/19.  She notes worsening pain w/ prolonged standing.  She had an L-spine and R tib/fib x-ray at her last visit.  She was prescribed gabapentin and referred to outpatient PT.  She has completed 2 PT sessions.  Since her last visit, pt reports some improvement in her symptoms.  She states that she cannot tolerate the gabapentin due to side effects.  She tried the medication x one week and then discontinued.  She has been doing a HEP as provided by PT.  Additionally she notes bilateral lower leg creepy crawly sensation when she is trying to fall asleep.  She notes this often requires her to move her legs around at sleep.  She thinks this is independent to her right leg symptoms.  She notes both her mother and her sister have been diagnosed with restless leg syndrome.  Pertinent review of systems: No fevers or chills  Relevant historical information: Positive family history of restless leg syndrome as above.   Exam:  BP (!) 142/92 (BP Location: Left Arm, Patient Position: Sitting, Cuff Size: Large)   Pulse 76   Ht 5\' 8"  (1.727 m)   Wt 231 lb 9.6 oz (105.1 kg)   SpO2 97%   BMI 35.21 kg/m  General: Well Developed, well nourished, and in no acute distress.   MSK:  L-spine: Normal motion. Right leg: Normal motion normal strength to foot dorsiflexion.  Lower leg nontender.    Lab and Radiology Results No results found for this or any previous visit (from the past 72 hour(s)). XR Lumbar Spine  Result Date: 10/21/2019 CLINICAL DATA:  Low back pain for the past year.  No known injury. EXAM: LUMBAR SPINE - COMPLETE 4+ VIEW COMPARISON:  CT abdomen and pelvis - 01/26/2012 FINDINGS: There are 6 non rib-bearing lumbar type vertebral  bodies. For the purposes of this dictation, lumbar levels be labeled L1-L6. Normal alignment of the lumbar spine. No anterolisthesis or retrolisthesis Lumbar vertebral body heights are preserved. Mild to moderate multilevel lumbar spine DDD, worse at L5-L6 and to a lesser extent, L3-L4 and L4-L5 with disc space height loss, endplate irregularity and sclerosis. Bilateral facet degenerative change of the lower lumbar spine. Limited visualization of the bilateral SI joints is normal. Atherosclerotic plaque within the abdominal aorta. 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 Moderate colonic stool burden without evidence of enteric obstruction. Punctate phlebolith overlies the left hemipelvis. IMPRESSION: 1. 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 2. Mild-to-moderate multilevel lumbar spine DDD, worse at L5-L6. Electronically Signed   By: Sandi Mariscal M.D.   On: 10/21/2019 10:19   XR Tib-Fib Right  Result Date: 10/21/2019 CLINICAL DATA:  Intermittent right lower leg pain for the past year. No injury. EXAM: RIGHT TIBIA AND FIBULA - 2 VIEW COMPARISON:  None. FINDINGS: No fracture or dislocation. Mild degenerative change of the knee is suspected, though incompletely evaluated. Enthesopathic change involving the superior pole of the patella. Limited visualization of the ankle is normal given obliquity and large field of view. Punctate presumed vascular calcification overlies the mid lower leg. Regional soft tissues appear otherwise normal. No radiopaque foreign body.  IMPRESSION: Mild degenerative change of the right knee. Otherwise, no explanation patient's intermittent right lower leg pain. Electronically Signed   By: Sandi Mariscal M.D.   On: 10/21/2019 10:15   I, Lynne Leader, personally (independently) visualized and performed the interpretation of the images attached in this note.     Assessment  and Plan: 69 y.o. female with low back pain and right lower leg pain.  Pain most likely at this time due to lumbar radiculopathy at L5 nerve root.  Patient did have significant improvement with physical therapy.  X-ray images today do show degenerative changes in L-spine at level that could cause lumbar radiculopathy. Plan to continue physical therapy.  If no benefit or not enough benefit in a few weeks patient will notify me and I will order MRI L-spine to plan for epidural steroid injection which should be the next step.  Continue gabapentin is not helpful.  Additionally patient has symptoms today consistent with restless leg syndrome.  She is a positive family history of this in her sister and mother.  Gabapentin was not helpful however at this point reasonable to try Requip.  Additionally we will try to request medical records from PCP.  Sometimes low iron stores can cause or exacerbate restless leg syndrome.   PDMP not reviewed this encounter. No orders of the defined types were placed in this encounter.  Meds ordered this encounter  Medications  . rOPINIRole (REQUIP) 0.25 MG tablet    Sig: Take 1-2 tablets (0.25-0.5 mg total) by mouth at bedtime.    Dispense:  90 tablet    Refill:  1     Discussed warning signs or symptoms. Please see discharge instructions. Patient expresses understanding.   The above documentation has been reviewed and is accurate and complete Lynne Leader

## 2019-10-21 NOTE — Patient Instructions (Signed)
Thank you for coming in today. Send me your lab results from December.  Try requip for restless leg syndrome.  Continue physical therapy for leg pain.   IF not improving let me know.  Next step is MRI of your lumbar spine.    Restless Legs Syndrome Restless legs syndrome is a condition that causes uncomfortable feelings or sensations in the legs, especially while sitting or lying down. The sensations usually cause an overwhelming urge to move the legs. The arms can also sometimes be affected. The condition can range from mild to severe. The symptoms often interfere with a person's ability to sleep. What are the causes? The cause of this condition is not known. What increases the risk? The following factors may make you more likely to develop this condition:  Being older than 50.  Pregnancy.  Being a woman. In general, the condition is more common in women than in men.  A family history of the condition.  Having iron deficiency.  Overuse of caffeine, nicotine, or alcohol.  Certain medical conditions, such as kidney disease, Parkinson's disease, or nerve damage.  Certain medicines, such as those for high blood pressure, nausea, colds, allergies, depression, and some heart conditions. What are the signs or symptoms? The main symptom of this condition is uncomfortable sensations in the legs, such as:  Pulling.  Tingling.  Prickling.  Throbbing.  Crawling.  Burning. Usually, the sensations:  Affect both sides of the body.  Are worse when you sit or lie down.  Are worse at night. These may wake you up or make it difficult to fall asleep.  Make you have a strong urge to move your legs.  Are temporarily relieved by moving your legs. The arms can also be affected, but this is rare. People who have this condition often have tiredness during the day because of their lack of sleep at night. How is this diagnosed? This condition may be diagnosed based on:  Your  symptoms.  Blood tests. In some cases, you may be monitored in a sleep lab by a specialist (a sleep study). This can detect any disruptions in your sleep. How is this treated? This condition is treated by managing the symptoms. This may include:  Lifestyle changes, such as exercising, using relaxation techniques, and avoiding caffeine, alcohol, or tobacco.  Medicines. Anti-seizure medicines may be tried first. Follow these instructions at home:     General instructions  Take over-the-counter and prescription medicines only as told by your health care provider.  Use methods to help relieve the uncomfortable sensations, such as: ? Massaging your legs. ? Walking or stretching. ? Taking a cold or hot bath.  Keep all follow-up visits as told by your health care provider. This is important. Lifestyle  Practice good sleep habits. For example, go to bed and get up at the same time every day. Most adults should get 7-9 hours of sleep each night.  Exercise regularly. Try to get at least 30 minutes of exercise most days of the week.  Practice ways of relaxing, such as yoga or meditation.  Avoid caffeine and alcohol.  Do not use any products that contain nicotine or tobacco, such as cigarettes and e-cigarettes. If you need help quitting, ask your health care provider. Contact a health care provider if:  Your symptoms get worse or they do not improve with treatment. Summary  Restless legs syndrome is a condition that causes uncomfortable feelings or sensations in the legs, especially while sitting or lying down.  The symptoms often interfere with a person's ability to sleep.  This condition is treated by managing the symptoms. You may need to make lifestyle changes or take medicines. This information is not intended to replace advice given to you by your health care provider. Make sure you discuss any questions you have with your health care provider. Document Revised: 10/02/2017  Document Reviewed: 10/02/2017 Elsevier Patient Education  [place].   Ropinirole tablets What is this medicine? ROPINIROLE (roe PIN i role) is used to treat the symptoms of Parkinson's disease. It helps to improve muscle control and movement difficulties. It is also used for the treatment of Restless Legs Syndrome. This medicine may be used for other purposes; ask your health care provider or pharmacist if you have questions. COMMON BRAND NAME(S): Requip What should I tell my health care provider before I take this medicine? They need to know if you have any of these conditions:  heart disease  high blood pressure  kidney disease  liver disease  low blood pressure  narcolepsy  sleep apnea  an unusual or allergic reaction to ropinirole, other medicines, foods, dyes, or preservatives  pregnant or trying to get pregnant  breast-feeding How should I use this medicine? Take this medicine by mouth with a glass of water. Follow the directions on the prescription label. You can take it with or without food. If it upsets your stomach, take it with food. Take your doses at regular intervals. Do not take your medicine more often than directed. Do not stop taking this medicine except on your doctor's advice. Stopping this medicine too quickly may cause serious side effects. Talk to your pediatrician regarding the use of this medicine in children. Special care may be needed. Overdosage: If you think you have taken too much of this medicine contact a poison control center or emergency room at once. NOTE: This medicine is only for you. Do not share this medicine with others. What if I miss a dose? If you miss a dose, take it as soon as you can. If it is almost time for your next dose, take only that dose. Do not take double or extra doses. What may interact with this medicine?  certain medicines for depression, mood, or psychotic disorders  ciprofloxacin  female hormones, like  estrogens and birth control pills  fluvoxamine  metoclopramide  mexiletine  norfloxacin  omeprazole  rifampin This list may not describe all possible interactions. Give your health care provider a list of all the medicines, herbs, non-prescription drugs, or dietary supplements you use. Also tell them if you smoke, drink alcohol, or use illegal drugs. Some items may interact with your medicine. What should I watch for while using this medicine? Visit your health care professional for regular checks on your progress. Tell your health care professional if your symptoms do not start to get better or if they get worse. Do not stop taking except on your health care professional's advice. You may develop a severe reaction. Your health care professional will tell you how much medicine to take. You may get drowsy or dizzy. Do not drive, use machinery, or do anything that needs mental alertness until you know how this drug affects you. Do not stand or sit up quickly, especially if you are an older patient. This reduces the risk of dizzy or fainting spells. Alcohol may interfere with the effect of this medicine. Avoid alcoholic drinks. When taking this medicine, you may fall asleep without notice. You may be  doing activities like driving a car, talking, or eating. You may not feel drowsy before it happens. Contact your health care provider right away if this happens to you. There have been reports of increased sexual urges or other strong urges such as gambling while taking this medicine. If you experience any of these while taking this medicine, you should report this to your health care provider as soon as possible. Your mouth may get dry. Chewing sugarless gum or sucking hard candy and drinking plenty of water may help. Contact your health care professional if the problem does not go away or is severe. You should check your skin often for changes to moles and new growths while taking this medicine. Call  your doctor if you notice any of these changes. What side effects may I notice from receiving this medicine? Side effects that you should report to your doctor or health care professional as soon as possible:  allergic reactions like skin rash, itching or hives, swelling of the face, lips, or tongue  breathing problems  changes in emotions or moods  changes in vision  chest pain  confusion  falling asleep during normal activities like driving  fast, irregular heartbeat  hallucinations  joint or muscle pain  loss of bladder control  loss of memory  new or increased gambling urges, sexual urges, uncontrolled spending, binge or compulsive eating, or other urges  pain, tingling, numbness in the hands or feet  signs and symptoms of low blood pressure like dizziness; feeling faint or lightheaded, falls; unusually weak or tired  swelling of the ankles, feet, hands  uncontrollable movements of the arms, face, head, mouth, neck, or upper body  vomiting Side effects that usually do not require medical attention (report to your doctor or health care professional if they continue or are bothersome):  dizziness  drowsiness  headache  increased sweating  nausea This list may not describe all possible side effects. Call your doctor for medical advice about side effects. You may report side effects to FDA at 1-800-FDA-1088. Where should I keep my medicine? Keep out of the reach of children. Store at room temperature between 20 and 25 degrees C (68 and 77 degrees F). Protect from light and moisture. Keep container tightly closed. Throw away any unused medicine after the expiration date. NOTE: This sheet is a summary. It may not cover all possible information. If you have questions about this medicine, talk to your doctor, pharmacist, or health care provider.  2020 Elsevier/Gold Standard (2019-05-16 16:52:05)

## 2019-10-22 ENCOUNTER — Ambulatory Visit: Payer: Medicare Other | Admitting: Physical Therapy

## 2019-10-22 DIAGNOSIS — M25571 Pain in right ankle and joints of right foot: Secondary | ICD-10-CM

## 2019-10-22 DIAGNOSIS — R262 Difficulty in walking, not elsewhere classified: Secondary | ICD-10-CM

## 2019-10-22 DIAGNOSIS — R252 Cramp and spasm: Secondary | ICD-10-CM | POA: Diagnosis not present

## 2019-10-22 DIAGNOSIS — E039 Hypothyroidism, unspecified: Secondary | ICD-10-CM | POA: Diagnosis not present

## 2019-10-22 DIAGNOSIS — E782 Mixed hyperlipidemia: Secondary | ICD-10-CM | POA: Diagnosis not present

## 2019-10-22 DIAGNOSIS — I10 Essential (primary) hypertension: Secondary | ICD-10-CM | POA: Diagnosis not present

## 2019-10-22 NOTE — Therapy (Signed)
[place] [place], [place], [address] Phone: [phone]   Fax:  [phone]  Physical Therapy Treatment  Patient Details  Name: Amanda Fry MRN: [id number] Date of Birth: 1950/10/30 Referring Provider (PT): Corye   Encounter Date: 10/22/2019  PT End of Session - 10/22/19 1431    Visit Number  3    Date for PT Re-Evaluation  12/13/19    PT Start Time  1400    PT Stop Time  1450    PT Time Calculation (min)  50 min       Past Medical History:  Diagnosis Date  . Allergic rhinitis   . Anterior communicating artery aneurysm   . Anxiety   . Carpal tunnel syndrome   . Cyst of kidney, acquired   . Esophageal reflux   . Family history of adverse reaction to anesthesia    sister N/V with anesthesia  . Fibromyalgia   . GERD (gastroesophageal reflux disease)   . Headache   . HLD (hyperlipidemia)   . Hypertension   . Hypothyroidism   . Insomnia   . Plantar fasciitis    left foot  . PONV (postoperative nausea and vomiting)     Past Surgical History:  Procedure Laterality Date  . APPENDECTOMY  2001  . BREAST LUMPECTOMY Left 1994  . CESAREAN SECTION    . IR ANGIO EXTRACRAN SEL COM CAROTID INNOMINATE UNI R MOD SED  07/11/2018  . IR ANGIO INTRA EXTRACRAN SEL COM CAROTID INNOMINATE BILAT MOD SED  05/10/2018  . IR ANGIO INTRA EXTRACRAN SEL INTERNAL CAROTID UNI L MOD SED  07/11/2018  . IR ANGIO VERTEBRAL SEL SUBCLAVIAN INNOMINATE UNI R MOD SED  07/11/2018  . IR ANGIO VERTEBRAL SEL VERTEBRAL BILAT MOD SED  05/10/2018  . IR ANGIO VERTEBRAL SEL VERTEBRAL UNI L MOD SED  07/11/2018  . IR ANGIOGRAM FOLLOW UP STUDY  07/11/2018  . IR INTRAVSC STENT CERV CAROTID W/EMB-PROT MOD SED INCL ANGIO  05/15/2018  . IR NEURO EACH ADD'L AFTER BASIC UNI LEFT (MS)  07/11/2018  . IR TRANSCATH/EMBOLIZ  07/11/2018  . PARTIAL HYSTERECTOMY  1994  . RADIOLOGY WITH ANESTHESIA N/A 05/10/2018   Procedure: EMBOLIZATION;   Surgeon: Luanne Bras, MD;  Location: [facility];  Service: Radiology;  Laterality: N/A;  . RADIOLOGY WITH ANESTHESIA N/A 05/15/2018   Procedure: Angioplasty with stenting;  Surgeon: Luanne Bras, MD;  Location: [facility];  Service: Radiology;  Laterality: N/A;  . RADIOLOGY WITH ANESTHESIA N/A 07/11/2018   Procedure: IR WITH ANESTHESIAANEURYSM/EMBOLIZATION;  Surgeon: Luanne Bras, MD;  Location: [facility];  Service: Radiology;  Laterality: N/A;  . TONSILLECTOMY      There were no vitals filed for this visit.  Subjective Assessment - 10/22/19 1401    Subjective  sore after last session- used some muscles I haven't used in awile. estim helps ankle/foot ( " just body ache/fibro pain, nothing specific")    Currently in Pain?  No/denies                       [facility] Adult PT Treatment/Exercise - 10/22/19 0001      Knee/Hip Exercises: Aerobic   Nustep  L 4 6 min      Knee/Hip Exercises: Machines for Strengthening   Cybex Leg Press  30# 2 sets 10   30# calf raises 2 sets 10     Knee/Hip Exercises: Standing   Hip Flexion  Both;Stengthening;10 reps;Knee straight  red tband   Hip ADduction  Strengthening;Both;10 reps   red tband   Hip Extension  Stengthening;Both;10 reps;Knee straight   red tband   Walking with Sports Cord  30# 5 x fwd/back, 3 x each side      Knee/Hip Exercises: Supine   Other Supine Knee/Hip Exercises  bridge, KTC and obl with ball 10 each    Other Supine Knee/Hip Exercises  ppt hip abd and hip flex BIL 10 each with red tband      Modalities   Modalities  Electrical Stimulation;Moist Heat      Moist Heat Therapy   Number Minutes Moist Heat  15 Minutes    Moist Heat Location  Ankle   extra toweling     Electrical Stimulation   Electrical Stimulation Location  right anterior tib and peroneal area    Electrical Stimulation Action  IFC    Electrical Stimulation Parameters  supine    Electrical Stimulation Goals  Pain      Ankle Exercises:  Supine   Other Supine Ankle Exercises  green 15x 4 ways                  PT Long Term Goals - 10/15/19 1505      PT LONG TERM GOAL #1   Title  independent with HEP    Time  8    Period  Weeks    Status  New      PT LONG TERM GOAL #2   Title  report 50% better sleeping    Time  8    Period  Weeks    Status  New      PT LONG TERM GOAL #3   Title  report 50% less pain    Time  8    Period  Weeks    Status  New      PT LONG TERM GOAL #4   Title  walk without limp    Time  8    Period  Weeks    Status  New            Plan - 10/22/19 1432    Clinical Impression Statement  pt verb no pain outside normal aches when arrived, c/o muscle soreness only after last session so increased ther ex and resisitance today and she did well, minimal c/o soreness mostly in back/hips with ex- cuing needed fo rex for posture and core activation.    PT Treatment/Interventions  ADLs/Self Care Home Management;Cryotherapy;Electrical Stimulation;Iontophoresis 4mg /ml Dexamethasone;Moist Heat;Ultrasound;Traction;Therapeutic activities;Gait training;Therapeutic exercise;Balance training;Neuromuscular re-education;Manual techniques;Dry needling;Patient/family education    PT Next Visit Plan  assess how pt felt and add to HEP , tband hip ,ankle and core strength       Patient will benefit from skilled therapeutic intervention in order to improve the following deficits and impairments:  Abnormal gait, Pain, Increased muscle spasms, Decreased range of motion, Decreased strength, Impaired flexibility, Difficulty walking  Visit Diagnosis: Pain in right ankle and joints of right foot  Cramp and spasm  Difficulty in walking, not elsewhere classified     Problem List Patient Active Problem List   Diagnosis Date Noted  . Brain aneurysm 07/11/2018  . Carotid stenosis, asymptomatic, unspecified laterality 05/15/2018    Izel Hochberg,[name] PTA 10/22/2019, 2:34 PM  [address] [place], [place], [address] Phone: [phone]   Fax:  [phone]  Name: KIMI MOCHIZUKI MRN: [id number] Date of Birth: 1951/05/08

## 2019-10-24 ENCOUNTER — Other Ambulatory Visit: Payer: Self-pay

## 2019-10-24 ENCOUNTER — Ambulatory Visit: Payer: Medicare Other | Admitting: Physical Therapy

## 2019-10-24 DIAGNOSIS — R252 Cramp and spasm: Secondary | ICD-10-CM

## 2019-10-24 DIAGNOSIS — M25571 Pain in right ankle and joints of right foot: Secondary | ICD-10-CM

## 2019-10-24 DIAGNOSIS — R262 Difficulty in walking, not elsewhere classified: Secondary | ICD-10-CM | POA: Diagnosis not present

## 2019-10-24 NOTE — Therapy (Signed)
[place], [place], [address] Phone: [phone]   Fax:  [phone]  Physical Therapy Treatment  Patient Details  Name: Amanda Fry MRN: [id number] Date of Birth: [birth year] Referring Provider (PT): Corye   Encounter Date: 10/24/2019  PT End of Session - 10/24/19 0254    Visit Number  4    Date for PT Re-Evaluation  12/13/19    PT Start Time  1400    PT Stop Time  1455    PT Time Calculation (min)  55 min       Past Medical History:  Diagnosis Date  . Allergic rhinitis   . Anterior communicating artery aneurysm   . Anxiety   . Carpal tunnel syndrome   . Cyst of kidney, acquired   . Esophageal reflux   . Family history of adverse reaction to anesthesia    sister N/V with anesthesia  . Fibromyalgia   . GERD (gastroesophageal reflux disease)   . Headache   . HLD (hyperlipidemia)   . Hypertension   . Hypothyroidism   . Insomnia   . Plantar fasciitis    left foot  . PONV (postoperative nausea and vomiting)     Past Surgical History:  Procedure Laterality Date  . APPENDECTOMY  2001  . BREAST LUMPECTOMY Left 1994  . CESAREAN SECTION    . IR ANGIO EXTRACRAN SEL COM CAROTID INNOMINATE UNI R MOD SED  07/11/2018  . IR ANGIO INTRA EXTRACRAN SEL COM CAROTID INNOMINATE BILAT MOD SED  05/10/2018  . IR ANGIO INTRA EXTRACRAN SEL INTERNAL CAROTID UNI L MOD SED  07/11/2018  . IR ANGIO VERTEBRAL SEL SUBCLAVIAN INNOMINATE UNI R MOD SED  07/11/2018  . IR ANGIO VERTEBRAL SEL VERTEBRAL BILAT MOD SED  05/10/2018  . IR ANGIO VERTEBRAL SEL VERTEBRAL UNI L MOD SED  07/11/2018  . IR ANGIOGRAM FOLLOW UP STUDY  07/11/2018  . IR INTRAVSC STENT CERV CAROTID W/EMB-PROT MOD SED INCL ANGIO  05/15/2018  . IR NEURO EACH ADD'L AFTER BASIC UNI LEFT (MS)  07/11/2018  . IR TRANSCATH/EMBOLIZ  07/11/2018  . PARTIAL HYSTERECTOMY  1994  . RADIOLOGY WITH ANESTHESIA N/A 05/10/2018   Procedure: EMBOLIZATION;   Surgeon: Luanne Bras, MD;  Location: [facility];  Service: Radiology;  Laterality: N/A;  . RADIOLOGY WITH ANESTHESIA N/A 05/15/2018   Procedure: Angioplasty with stenting;  Surgeon: Luanne Bras, MD;  Location: [facility];  Service: Radiology;  Laterality: N/A;  . RADIOLOGY WITH ANESTHESIA N/A 07/11/2018   Procedure: IR WITH ANESTHESIAANEURYSM/EMBOLIZATION;  Surgeon: Luanne Bras, MD;  Location: [facility];  Service: Radiology;  Laterality: N/A;  . TONSILLECTOMY      There were no vitals filed for this visit.  Subjective Assessment - 10/24/19 1357    Subjective  feeling pretty good an dsurprisingly not sore after last session    Currently in Pain?  No/denies                       [facility] Adult PT Treatment/Exercise - 10/24/19 0001      Knee/Hip Exercises: Aerobic   Nustep  L 5 17mn      Knee/Hip Exercises: Machines for Strengthening   Cybex Leg Press  30# 2 sets 10   30# calf raises 2 sets 10     Knee/Hip Exercises: Standing   Hip Flexion  Both;Stengthening;10 reps;Knee straight   red tband   Hip ADduction  Strengthening;Both;10 reps   red  tband   Hip Extension  Stengthening;Both;10 reps;Knee straight   red tband   Lateral Step Up  Both;10 reps;Hand Hold: 2;Step Height: 6"   opp leg abd   Forward Step Up  Both;10 reps;Hand Hold: 2;Step Height: 6"   opp leg ext   Walking with Sports Cord  30# 5 x fwd/back, 3 x each side      Knee/Hip Exercises: Supine   Other Supine Knee/Hip Exercises  bridge, KTC and obl with ball 10 each   isometric abd 15 times   Other Supine Knee/Hip Exercises  ppt hip abd and hip flex BIL 10 each with red tband      Modalities   Modalities  Electrical Stimulation;Moist Heat      Moist Heat Therapy   Number Minutes Moist Heat  15 Minutes    Moist Heat Location  Ankle      Electrical Stimulation   Electrical Stimulation Location  right anterior tib and peroneal area    Electrical Stimulation Action  IFC    Electrical Stimulation  Parameters  supine    Electrical Stimulation Goals  Pain      Ankle Exercises: Supine   Other Supine Ankle Exercises  green 15x 4 ways             PT Education - 10/24/19 1422    Education Details  standing hip 3 way red tband, ppt with marching and hip abd red tband. bridge    Person(s) Educated  Patient    Methods  Explanation;Demonstration;Handout    Comprehension  Verbalized understanding;Returned demonstration          PT Long Term Goals - 10/24/19 1426      PT LONG TERM GOAL #1   Title  independent with HEP    Status  Partially Met      PT LONG TERM GOAL #2   Title  report 50% better sleeping    Status  Achieved      PT LONG TERM GOAL #3   Title  report 50% less pain    Status  Partially Met      PT LONG TERM GOAL #4   Title  walk without limp    Status  Partially Met              Patient will benefit from skilled therapeutic intervention in order to improve the following deficits and impairments:     Visit Diagnosis: Pain in right ankle and joints of right foot  Cramp and spasm  Difficulty in walking, not elsewhere classified     Problem List Patient Active Problem List   Diagnosis Date Noted  . Brain aneurysm 07/11/2018  . Carotid stenosis, asymptomatic, unspecified laterality 05/15/2018    Anis Degidio,[name] PTA 10/24/2019, 2:38 PM  [place] [address] [place], [place], [address] Phone: [phone]   Fax:  [phone]  Name: Amanda Fry MRN: [id number] Date of Birth: 1951-03-16

## 2019-10-27 ENCOUNTER — Ambulatory Visit: Payer: Medicare Other

## 2019-10-31 ENCOUNTER — Ambulatory Visit: Payer: Medicare Other | Attending: Family Medicine | Admitting: Physical Therapy

## 2019-10-31 ENCOUNTER — Other Ambulatory Visit: Payer: Self-pay

## 2019-10-31 DIAGNOSIS — R262 Difficulty in walking, not elsewhere classified: Secondary | ICD-10-CM | POA: Diagnosis not present

## 2019-10-31 DIAGNOSIS — R252 Cramp and spasm: Secondary | ICD-10-CM | POA: Insufficient documentation

## 2019-10-31 DIAGNOSIS — M25571 Pain in right ankle and joints of right foot: Secondary | ICD-10-CM | POA: Diagnosis not present

## 2019-10-31 NOTE — Therapy (Signed)
[place], [place], [address] Phone: [phone]   Fax:  [phone]  Physical Therapy Treatment  Patient Details  Name: Amanda Fry MRN: [id number] Date of Birth: 03-Nov-1950 Referring Provider (PT): Corye   Encounter Date: 10/31/2019  PT End of Session - 10/31/19 1552    Visit Number  5    Date for PT Re-Evaluation  12/13/19    PT Start Time  9798    PT Stop Time  1605    PT Time Calculation (min)  50 min       Past Medical History:  Diagnosis Date  . Allergic rhinitis   . Anterior communicating artery aneurysm   . Anxiety   . Carpal tunnel syndrome   . Cyst of kidney, acquired   . Esophageal reflux   . Family history of adverse reaction to anesthesia    sister N/V with anesthesia  . Fibromyalgia   . GERD (gastroesophageal reflux disease)   . Headache   . HLD (hyperlipidemia)   . Hypertension   . Hypothyroidism   . Insomnia   . Plantar fasciitis    left foot  . PONV (postoperative nausea and vomiting)     Past Surgical History:  Procedure Laterality Date  . APPENDECTOMY  2001  . BREAST LUMPECTOMY Left 1994  . CESAREAN SECTION    . IR ANGIO EXTRACRAN SEL COM CAROTID INNOMINATE UNI R MOD SED  07/11/2018  . IR ANGIO INTRA EXTRACRAN SEL COM CAROTID INNOMINATE BILAT MOD SED  05/10/2018  . IR ANGIO INTRA EXTRACRAN SEL INTERNAL CAROTID UNI L MOD SED  07/11/2018  . IR ANGIO VERTEBRAL SEL SUBCLAVIAN INNOMINATE UNI R MOD SED  07/11/2018  . IR ANGIO VERTEBRAL SEL VERTEBRAL BILAT MOD SED  05/10/2018  . IR ANGIO VERTEBRAL SEL VERTEBRAL UNI L MOD SED  07/11/2018  . IR ANGIOGRAM FOLLOW UP STUDY  07/11/2018  . IR INTRAVSC STENT CERV CAROTID W/EMB-PROT MOD SED INCL ANGIO  05/15/2018  . IR NEURO EACH ADD'L AFTER BASIC UNI LEFT (MS)  07/11/2018  . IR TRANSCATH/EMBOLIZ  07/11/2018  . PARTIAL HYSTERECTOMY  1994  . RADIOLOGY WITH ANESTHESIA N/A 05/10/2018   Procedure: EMBOLIZATION;  Surgeon:  Luanne Bras, MD;  Location: [facility];  Service: Radiology;  Laterality: N/A;  . RADIOLOGY WITH ANESTHESIA N/A 05/15/2018   Procedure: Angioplasty with stenting;  Surgeon: Luanne Bras, MD;  Location: [facility];  Service: Radiology;  Laterality: N/A;  . RADIOLOGY WITH ANESTHESIA N/A 07/11/2018   Procedure: IR WITH ANESTHESIAANEURYSM/EMBOLIZATION;  Surgeon: Luanne Bras, MD;  Location: [facility];  Service: Radiology;  Laterality: N/A;  . TONSILLECTOMY      There were no vitals filed for this visit.  Subjective Assessment - 10/31/19 1514    Subjective  feeling good, no foot pain, alittle in your back. no sleep issues    Currently in Pain?  Yes    Pain Score  2     Pain Location  Back    Pain Orientation  Left;Lateral         [facility] PT Assessment - 10/31/19 0001      AROM   Overall AROM Comments  Lumbar ROM WFL's with some left low back pain, ankle ROM WFL's      Strength   Overall Strength Comments  hips 4/5, ankles 5/5 no pain                   [facility] Adult PT  Treatment/Exercise - 10/31/19 0001      Exercises   Exercises  Lumbar      Lumbar Exercises: Machines for Strengthening   Cybex Lumbar Extension  black tband 2 sets 10    Cybex Knee Extension  10# 2 sets 10    Cybex Knee Flexion  25# 2 sets 10    Other Lumbar Machine Exercise  row and lat pull 20# 2 sets 10    Cybex Leg Press  30# 2 sets 10   calf raises 30# 2 sets 10     Knee/Hip Exercises: Aerobic   Nustep  L 5 71mn      Knee/Hip Exercises: Supine   Other Supine Knee/Hip Exercises  bridge, KTC and obl with ball 10 each   isometric abs, bridge with ball btwn knees 15 each   Other Supine Knee/Hip Exercises  ppt hip abd and hip flex BIL 15 each with green tband      Modalities   Modalities  Electrical Stimulation;Moist Heat      Moist Heat Therapy   Number Minutes Moist Heat  15 Minutes    Moist Heat Location  Lumbar Spine      Electrical Stimulation   Electrical Stimulation Location  LBP     Electrical Stimulation Action  IFC    Electrical Stimulation Parameters  supine    Electrical Stimulation Goals  Pain                  PT Long Term Goals - 10/31/19 1551      PT LONG TERM GOAL #1   Title  independent with HEP    Status  Partially Met      PT LONG TERM GOAL #2   Title  report 50% better sleeping    Status  Achieved      PT LONG TERM GOAL #3   Title  report 50% less pain    Status  Partially Met      PT LONG TERM GOAL #4   Title  walk without limp    Status  Achieved            Plan - 10/31/19 1552    Clinical Impression Statement  no ankle pain and MMT 5/5. focus of core stab and LE stength - weak core and weak hips with some pain with stab ex. progressing towards LTG. trial of estim and heat on back    PT Treatment/Interventions  ADLs/Self Care Home Management;Cryotherapy;Electrical Stimulation;Iontophoresis '4mg'$ /ml Dexamethasone;Moist Heat;Ultrasound;Traction;Therapeutic activities;Gait training;Therapeutic exercise;Balance training;Neuromuscular re-education;Manual techniques;Dry needling;Patient/family education    PT Next Visit Plan  core stab       Patient will benefit from skilled therapeutic intervention in order to improve the following deficits and impairments:  Abnormal gait, Pain, Increased muscle spasms, Decreased range of motion, Decreased strength, Impaired flexibility, Difficulty walking  Visit Diagnosis: Difficulty in walking, not elsewhere classified  Cramp and spasm     Problem List Patient Active Problem List   Diagnosis Date Noted  . Brain aneurysm 07/11/2018  . Carotid stenosis, asymptomatic, unspecified laterality 05/15/2018    Amanda Fry,[name] PTA 10/31/2019, 3:55 PM  [address] [address]Phone: [phone]  Fax:  [phone] Name: Amanda LAFOUNTAINMRN: [id number] of Birth: 107/21/1952

## 2019-11-01 ENCOUNTER — Ambulatory Visit: Payer: Medicare Other | Attending: Internal Medicine

## 2019-11-01 DIAGNOSIS — Z23 Encounter for immunization: Secondary | ICD-10-CM | POA: Insufficient documentation

## 2019-11-01 NOTE — Progress Notes (Signed)
   Covid-19 Vaccination Clinic  Name:  Amanda Fry    MRN: [id number] DOB: 05/14/1951  11/01/2019  Amanda Fry was observed post Covid-19 immunization for 15 minutes without incidence. She was provided with Vaccine Information Sheet and instruction to access the V-Safe system.   Amanda Fry was instructed to call 911 with any severe reactions post vaccine: Marland Kitchen Difficulty breathing  . Swelling of your face and throat  . A fast heartbeat  . A bad rash all over your body  . Dizziness and weakness    Immunizations Administered    Name Date Dose VIS Date Route   Pfizer COVID-19 Vaccine 11/01/2019 11:16 AM 0.3 mL 09/06/2019 Intramuscular   Manufacturer: [place]   Lot: [id number]   [place]: [id number]

## 2019-11-05 ENCOUNTER — Ambulatory Visit: Payer: Medicare Other | Admitting: Physical Therapy

## 2019-11-05 ENCOUNTER — Other Ambulatory Visit: Payer: Self-pay

## 2019-11-05 DIAGNOSIS — R252 Cramp and spasm: Secondary | ICD-10-CM

## 2019-11-05 DIAGNOSIS — M25571 Pain in right ankle and joints of right foot: Secondary | ICD-10-CM

## 2019-11-05 DIAGNOSIS — R262 Difficulty in walking, not elsewhere classified: Secondary | ICD-10-CM | POA: Diagnosis not present

## 2019-11-05 NOTE — Therapy (Signed)
[place], [place], [address] Phone: [phone]   Fax:  [phone]  Physical Therapy Treatment  Patient Details  Name: Amanda Fry MRN: [id number] Date of Birth: 11-22-1950 Referring Provider (PT): Corye   Encounter Date: 11/05/2019  PT End of Session - 11/05/19 1449    Visit Number  6    Date for PT Re-Evaluation  12/13/19    PT Start Time  1400    PT Stop Time  1442    PT Time Calculation (min)  42 min       Past Medical History:  Diagnosis Date  . Allergic rhinitis   . Anterior communicating artery aneurysm   . Anxiety   . Carpal tunnel syndrome   . Cyst of kidney, acquired   . Esophageal reflux   . Family history of adverse reaction to anesthesia    sister N/V with anesthesia  . Fibromyalgia   . GERD (gastroesophageal reflux disease)   . Headache   . HLD (hyperlipidemia)   . Hypertension   . Hypothyroidism   . Insomnia   . Plantar fasciitis    left foot  . PONV (postoperative nausea and vomiting)     Past Surgical History:  Procedure Laterality Date  . APPENDECTOMY  2001  . BREAST LUMPECTOMY Left 1994  . CESAREAN SECTION    . IR ANGIO EXTRACRAN SEL COM CAROTID INNOMINATE UNI R MOD SED  07/11/2018  . IR ANGIO INTRA EXTRACRAN SEL COM CAROTID INNOMINATE BILAT MOD SED  05/10/2018  . IR ANGIO INTRA EXTRACRAN SEL INTERNAL CAROTID UNI L MOD SED  07/11/2018  . IR ANGIO VERTEBRAL SEL SUBCLAVIAN INNOMINATE UNI R MOD SED  07/11/2018  . IR ANGIO VERTEBRAL SEL VERTEBRAL BILAT MOD SED  05/10/2018  . IR ANGIO VERTEBRAL SEL VERTEBRAL UNI L MOD SED  07/11/2018  . IR ANGIOGRAM FOLLOW UP STUDY  07/11/2018  . IR INTRAVSC STENT CERV CAROTID W/EMB-PROT MOD SED INCL ANGIO  05/15/2018  . IR NEURO EACH ADD'L AFTER BASIC UNI LEFT (MS)  07/11/2018  . IR TRANSCATH/EMBOLIZ  07/11/2018  . PARTIAL HYSTERECTOMY  1994  . RADIOLOGY WITH ANESTHESIA N/A 05/10/2018   Procedure: EMBOLIZATION;  Surgeon:  Luanne Bras, MD;  Location: [facility];  Service: Radiology;  Laterality: N/A;  . RADIOLOGY WITH ANESTHESIA N/A 05/15/2018   Procedure: Angioplasty with stenting;  Surgeon: Luanne Bras, MD;  Location: [facility];  Service: Radiology;  Laterality: N/A;  . RADIOLOGY WITH ANESTHESIA N/A 07/11/2018   Procedure: IR WITH ANESTHESIAANEURYSM/EMBOLIZATION;  Surgeon: Luanne Bras, MD;  Location: [facility];  Service: Radiology;  Laterality: N/A;  . TONSILLECTOMY      There were no vitals filed for this visit.  Subjective Assessment - 11/05/19 1403    Subjective  estim didnt do anything on back. no issues on foot/leg. some hip pain today-not sure why. Very sore from ex after last session    Currently in Pain?  Yes    Pain Score  4     Pain Location  Hip    Pain Orientation  Right;Lateral                       [facility] Adult PT Treatment/Exercise - 11/05/19 0001      Lumbar Exercises: Machines for Strengthening   Cybex Lumbar Extension  black tband 2 sets 10    Cybex Knee Extension  10# 2 sets 10    Cybex Knee  Flexion  25# 2 sets 15    Other Lumbar Machine Exercise  row and lat pull 20# 2 sets 10      Knee/Hip Exercises: Aerobic   Nustep  L 5 34mn      Knee/Hip Exercises: Machines for Strengthening   Cybex Leg Press  30# 3 sets 10   calf raises 30# 3 sets 10     Knee/Hip Exercises: Standing   Other Standing Knee Exercises  hip 4 way with cable pulleys 10# 10xeach BIL      Manual Therapy   Manual Therapy  Passive ROM    Passive ROM  LE and trunk                  PT Long Term Goals - 10/31/19 1551      PT LONG TERM GOAL #1   Title  independent with HEP    Status  Partially Met      PT LONG TERM GOAL #2   Title  report 50% better sleeping    Status  Achieved      PT LONG TERM GOAL #3   Title  report 50% less pain    Status  Partially Met      PT LONG TERM GOAL #4   Title  walk without limp    Status  Achieved            Plan - 11/05/19  1450    Clinical Impression Statement  pt had some difficulty with standing hip ex d/t increased hip pain. educated on log rolling for back safety when getting OOB. Pt very trunk in LE L> R and felt good relief with MT/stretches    PT Treatment/Interventions  ADLs/Self Care Home Management;Cryotherapy;Electrical Stimulation;Iontophoresis '4mg'$ /ml Dexamethasone;Moist Heat;Ultrasound;Traction;Therapeutic activities;Gait training;Therapeutic exercise;Balance training;Neuromuscular re-education;Manual techniques;Dry needling;Patient/family education    PT Next Visit Plan  core stab and stretching       Patient will benefit from skilled therapeutic intervention in order to improve the following deficits and impairments:  Abnormal gait, Pain, Increased muscle spasms, Decreased range of motion, Decreased strength, Impaired flexibility, Difficulty walking  Visit Diagnosis: Cramp and spasm  Pain in right ankle and joints of right foot  Difficulty in walking, not elsewhere classified     Problem List Patient Active Problem List   Diagnosis Date Noted  . Brain aneurysm 07/11/2018  . Carotid stenosis, asymptomatic, unspecified laterality 05/15/2018    Sundai Probert,[name] PTA 11/05/2019, 2:52 PM  [address] [address]Phone: [phone]  Fax:  [phone] Name: Amanda CRANDLEMRN: [id number] of Birth: [birth year]

## 2019-11-07 ENCOUNTER — Ambulatory Visit: Payer: Medicare Other

## 2019-11-07 ENCOUNTER — Ambulatory Visit: Payer: Medicare Other | Admitting: Physical Therapy

## 2019-11-12 ENCOUNTER — Other Ambulatory Visit: Payer: Self-pay

## 2019-11-12 ENCOUNTER — Ambulatory Visit: Payer: Medicare Other | Admitting: Physical Therapy

## 2019-11-12 DIAGNOSIS — R262 Difficulty in walking, not elsewhere classified: Secondary | ICD-10-CM | POA: Diagnosis not present

## 2019-11-12 DIAGNOSIS — R252 Cramp and spasm: Secondary | ICD-10-CM

## 2019-11-12 DIAGNOSIS — M25571 Pain in right ankle and joints of right foot: Secondary | ICD-10-CM | POA: Diagnosis not present

## 2019-11-12 NOTE — Therapy (Signed)
[place], [place], [address] Phone: [phone]   Fax:  [phone]  Physical Therapy Treatment  Patient Details  Name: Amanda Fry MRN: [id number] Date of Birth: 1951/05/21 Referring Provider (PT): Corye   Encounter Date: 11/12/2019  PT End of Session - 11/12/19 1353    Visit Number  7    Date for PT Re-Evaluation  12/13/19    PT Start Time  1310    PT Stop Time  1400    PT Time Calculation (min)  50 min       Past Medical History:  Diagnosis Date  . Allergic rhinitis   . Anterior communicating artery aneurysm   . Anxiety   . Carpal tunnel syndrome   . Cyst of kidney, acquired   . Esophageal reflux   . Family history of adverse reaction to anesthesia    sister N/V with anesthesia  . Fibromyalgia   . GERD (gastroesophageal reflux disease)   . Headache   . HLD (hyperlipidemia)   . Hypertension   . Hypothyroidism   . Insomnia   . Plantar fasciitis    left foot  . PONV (postoperative nausea and vomiting)     Past Surgical History:  Procedure Laterality Date  . APPENDECTOMY  2001  . BREAST LUMPECTOMY Left 1994  . CESAREAN SECTION    . IR ANGIO EXTRACRAN SEL COM CAROTID INNOMINATE UNI R MOD SED  07/11/2018  . IR ANGIO INTRA EXTRACRAN SEL COM CAROTID INNOMINATE BILAT MOD SED  05/10/2018  . IR ANGIO INTRA EXTRACRAN SEL INTERNAL CAROTID UNI L MOD SED  07/11/2018  . IR ANGIO VERTEBRAL SEL SUBCLAVIAN INNOMINATE UNI R MOD SED  07/11/2018  . IR ANGIO VERTEBRAL SEL VERTEBRAL BILAT MOD SED  05/10/2018  . IR ANGIO VERTEBRAL SEL VERTEBRAL UNI L MOD SED  07/11/2018  . IR ANGIOGRAM FOLLOW UP STUDY  07/11/2018  . IR INTRAVSC STENT CERV CAROTID W/EMB-PROT MOD SED INCL ANGIO  05/15/2018  . IR NEURO EACH ADD'L AFTER BASIC UNI LEFT (MS)  07/11/2018  . IR TRANSCATH/EMBOLIZ  07/11/2018  . PARTIAL HYSTERECTOMY  1994  . RADIOLOGY WITH ANESTHESIA N/A 05/10/2018   Procedure: EMBOLIZATION;   Surgeon: Luanne Bras, MD;  Location: [facility];  Service: Radiology;  Laterality: N/A;  . RADIOLOGY WITH ANESTHESIA N/A 05/15/2018   Procedure: Angioplasty with stenting;  Surgeon: Luanne Bras, MD;  Location: [facility];  Service: Radiology;  Laterality: N/A;  . RADIOLOGY WITH ANESTHESIA N/A 07/11/2018   Procedure: IR WITH ANESTHESIAANEURYSM/EMBOLIZATION;  Surgeon: Luanne Bras, MD;  Location: [facility];  Service: Radiology;  Laterality: N/A;  . TONSILLECTOMY      There were no vitals filed for this visit.  Subjective Assessment - 11/12/19 1311    Subjective  back good and bad days- stretching helped. some pain on top of RT foot    Currently in Pain?  Yes    Pain Score  3     Pain Location  Back                       [facility] Adult PT Treatment/Exercise - 11/12/19 0001      Knee/Hip Exercises: Aerobic   Nustep  L 5 31mn      Knee/Hip Exercises: Standing   Hip ADduction  Strengthening;Both;15 reps   red tband   Hip Extension  Stengthening;Both;Knee straight;15 reps   red tband   Other Standing Knee Exercises  red tband shld ext and retraction 15x      Modalities   Modalities  Traction      Moist Heat Therapy   Number Minutes Moist Heat  10 Minutes    Moist Heat Location  Lumbar Spine   sitting after traction     Traction   Type of Traction  Lumbar    Min (lbs)  45    Max (lbs)  60    Hold Time  60    Rest Time  20    Time  10      Manual Therapy   Manual Therapy  Passive ROM    Passive ROM  LE and trunk   HS,piriformis,ITB                 PT Long Term Goals - 10/31/19 1551      PT LONG TERM GOAL #1   Title  independent with HEP    Status  Partially Met      PT LONG TERM GOAL #2   Title  report 50% better sleeping    Status  Achieved      PT LONG TERM GOAL #3   Title  report 50% less pain    Status  Partially Met      PT LONG TERM GOAL #4   Title  walk without limp    Status  Achieved            Plan - 11/12/19  1354    Clinical Impression Statement  pt felt stretching was very helpful last time so requested traction and during traction distanction felt good so tried Manual tarction,so increased LB stiffness after to [place] 10 min after    PT Treatment/Interventions  ADLs/Self Care Home Management;Cryotherapy;Electrical Stimulation;Iontophoresis '4mg'$ /ml Dexamethasone;Moist Heat;Ultrasound;Traction;Therapeutic activities;Gait training;Therapeutic exercise;Balance training;Neuromuscular re-education;Manual techniques;Dry needling;Patient/family education    PT Next Visit Plan  assess traction, check goals       Patient will benefit from skilled therapeutic intervention in order to improve the following deficits and impairments:  Abnormal gait, Pain, Increased muscle spasms, Decreased range of motion, Decreased strength, Impaired flexibility, Difficulty walking  Visit Diagnosis: Cramp and spasm  Difficulty in walking, not elsewhere classified     Problem List Patient Active Problem List   Diagnosis Date Noted  . Brain aneurysm 07/11/2018  . Carotid stenosis, asymptomatic, unspecified laterality 05/15/2018    Briar Sword,[name] PTA 11/12/2019, 1:56 PM  [place] [address] [place], [place], [address] Phone: [phone]   Fax:  [phone]  Name: Amanda Fry MRN: [id number] Date of Birth: 09-17-1951

## 2019-11-14 ENCOUNTER — Ambulatory Visit: Payer: Medicare Other | Admitting: Physical Therapy

## 2019-11-19 ENCOUNTER — Other Ambulatory Visit: Payer: Self-pay

## 2019-11-19 ENCOUNTER — Ambulatory Visit: Payer: Medicare Other | Admitting: Physical Therapy

## 2019-11-19 DIAGNOSIS — R262 Difficulty in walking, not elsewhere classified: Secondary | ICD-10-CM | POA: Diagnosis not present

## 2019-11-19 DIAGNOSIS — R252 Cramp and spasm: Secondary | ICD-10-CM | POA: Diagnosis not present

## 2019-11-19 DIAGNOSIS — M25571 Pain in right ankle and joints of right foot: Secondary | ICD-10-CM

## 2019-11-19 NOTE — Therapy (Signed)
[place], [place], [address] Phone: [phone]   Fax:  [phone]  Physical Therapy Treatment  Patient Details  Name: Amanda Fry MRN: [id number] Date of Birth: 1951/01/25 Referring Provider (PT): Corye   Encounter Date: 11/19/2019  PT End of Session - 11/19/19 1559    Visit Number  8    Date for PT Re-Evaluation  12/13/19    PT Start Time  1525    PT Stop Time  8309    PT Time Calculation (min)  50 min       Past Medical History:  Diagnosis Date  . Allergic rhinitis   . Anterior communicating artery aneurysm   . Anxiety   . Carpal tunnel syndrome   . Cyst of kidney, acquired   . Esophageal reflux   . Family history of adverse reaction to anesthesia    sister N/V with anesthesia  . Fibromyalgia   . GERD (gastroesophageal reflux disease)   . Headache   . HLD (hyperlipidemia)   . Hypertension   . Hypothyroidism   . Insomnia   . Plantar fasciitis    left foot  . PONV (postoperative nausea and vomiting)     Past Surgical History:  Procedure Laterality Date  . APPENDECTOMY  2001  . BREAST LUMPECTOMY Left 1994  . CESAREAN SECTION    . IR ANGIO EXTRACRAN SEL COM CAROTID INNOMINATE UNI R MOD SED  07/11/2018  . IR ANGIO INTRA EXTRACRAN SEL COM CAROTID INNOMINATE BILAT MOD SED  05/10/2018  . IR ANGIO INTRA EXTRACRAN SEL INTERNAL CAROTID UNI L MOD SED  07/11/2018  . IR ANGIO VERTEBRAL SEL SUBCLAVIAN INNOMINATE UNI R MOD SED  07/11/2018  . IR ANGIO VERTEBRAL SEL VERTEBRAL BILAT MOD SED  05/10/2018  . IR ANGIO VERTEBRAL SEL VERTEBRAL UNI L MOD SED  07/11/2018  . IR ANGIOGRAM FOLLOW UP STUDY  07/11/2018  . IR INTRAVSC STENT CERV CAROTID W/EMB-PROT MOD SED INCL ANGIO  05/15/2018  . IR NEURO EACH ADD'L AFTER BASIC UNI LEFT (MS)  07/11/2018  . IR TRANSCATH/EMBOLIZ  07/11/2018  . PARTIAL HYSTERECTOMY  1994  . RADIOLOGY WITH ANESTHESIA N/A 05/10/2018   Procedure: EMBOLIZATION;   Surgeon: Luanne Bras, MD;  Location: [facility];  Service: Radiology;  Laterality: N/A;  . RADIOLOGY WITH ANESTHESIA N/A 05/15/2018   Procedure: Angioplasty with stenting;  Surgeon: Luanne Bras, MD;  Location: [facility];  Service: Radiology;  Laterality: N/A;  . RADIOLOGY WITH ANESTHESIA N/A 07/11/2018   Procedure: IR WITH ANESTHESIAANEURYSM/EMBOLIZATION;  Surgeon: Luanne Bras, MD;  Location: [facility];  Service: Radiology;  Laterality: N/A;  . TONSILLECTOMY      There were no vitals filed for this visit.  Subjective Assessment - 11/19/19 1525    Subjective  " traction was not good- pain after, dont want to do that again". wore different shoes and ant leg/ankle pain came back    Currently in Pain?  Yes    Pain Score  3                        [facility] Adult PT Treatment/Exercise - 11/19/19 0001      Knee/Hip Exercises: Aerobic   Nustep  L 5 19mn      Knee/Hip Exercises: Machines for Strengthening   Cybex Leg Press  30# 2 sets 15   30# calf raises 2 sets 15     Knee/Hip Exercises: Standing  Hip Flexion  Stengthening;Both;15 reps;Knee straight   red tband   Hip Abduction  Stengthening;Both;15 reps;Knee straight   red tband   Hip Extension  Stengthening;Both;Knee straight;15 reps   red tband     Moist Heat Therapy   Number Minutes Moist Heat  15 Minutes    Moist Heat Location  Lumbar Spine;Ankle      Electrical Stimulation   Electrical Stimulation Location  Rt ant tib/ankle    Chartered certified [employer]  IF[employer]    Electrical Stimulation Parameters  supine    Electrical Stimulation Goals  Pain      Ankle Exercises: Standing   Rocker Board  2 minutes    Heel Raises  15 reps   airex   Toe Raise  15 reps   airex                 PT Long Term Goals - 10/31/19 1551      PT LONG TERM GOAL #1   Title  independent with HEP    Status  Partially Met      PT LONG TERM GOAL #2   Title  report 50% better sleeping    Status  Achieved      PT  LONG TERM GOAL #3   Title  report 50% less pain    Status  Partially Met      PT LONG TERM GOAL #4   Title  walk without limp    Status  Achieved            Plan - 11/19/19 1559    Clinical Impression Statement  pt did not like traction as it increased pain. pt also stated she changed shoes and RT ant leg/shin pain returned -also with c/o more pain when sitting in recliner ( discussed position change to see if that affects foot). worked on [employer] and hip/core strength today.resumed estim on ant leg/ankle as it helped pain last time.    PT Treatment/Interventions  ADLs/Self Care Home Management;Cryotherapy;Electrical Stimulation;Iontophoresis '4mg'$ /ml Dexamethasone;Moist Heat;Ultrasound;Traction;Therapeutic activities;Gait training;Therapeutic exercise;Balance training;Neuromuscular re-education;Manual techniques;Dry needling;Patient/family education    PT Next Visit Plan  assess pain and check goals       Patient will benefit from skilled therapeutic intervention in order to improve the following deficits and impairments:  Abnormal gait, Pain, Increased muscle spasms, Decreased range of motion, Decreased strength, Impaired flexibility, Difficulty walking  Visit Diagnosis: Cramp and spasm  Difficulty in walking, not elsewhere classified  Pain in right ankle and joints of right foot     Problem List Patient Active Problem List   Diagnosis Date Noted  . Brain aneurysm 07/11/2018  . Carotid stenosis, asymptomatic, unspecified laterality 05/15/2018    Danice Dippolito,[name] PTA 11/19/2019, 4:01 PM  [place], [place], [address] Phone: [phone]   Fax:  [phone]  Name: Amanda Fry MRN: [id number] Date of Birth: [birth year]

## 2019-11-21 ENCOUNTER — Telehealth (HOSPITAL_COMMUNITY): Payer: Self-pay

## 2019-11-21 ENCOUNTER — Encounter: Payer: Self-pay | Admitting: Family Medicine

## 2019-11-21 ENCOUNTER — Other Ambulatory Visit (INDEPENDENT_AMBULATORY_CARE_PROVIDER_SITE_OTHER): Payer: Medicare Other

## 2019-11-21 ENCOUNTER — Ambulatory Visit (INDEPENDENT_AMBULATORY_CARE_PROVIDER_SITE_OTHER): Payer: Medicare Other | Admitting: Family Medicine

## 2019-11-21 ENCOUNTER — Other Ambulatory Visit (HOSPITAL_COMMUNITY): Payer: Self-pay | Admitting: Interventional Radiology

## 2019-11-21 ENCOUNTER — Other Ambulatory Visit: Payer: Self-pay

## 2019-11-21 VITALS — BP 138/88 | HR 76 | Ht 68.0 in | Wt 236.0 lb

## 2019-11-21 DIAGNOSIS — M5441 Lumbago with sciatica, right side: Secondary | ICD-10-CM

## 2019-11-21 DIAGNOSIS — G2581 Restless legs syndrome: Secondary | ICD-10-CM

## 2019-11-21 DIAGNOSIS — D509 Iron deficiency anemia, unspecified: Secondary | ICD-10-CM | POA: Diagnosis not present

## 2019-11-21 LAB — CBC
HCT: 39.9 % (ref 36.0–46.0)
Hemoglobin: 13.5 g/dL (ref 12.0–15.0)
MCHC: 33.8 g/dL (ref 30.0–36.0)
MCV: 93 fl (ref 78.0–100.0)
Platelets: 188 10*3/uL (ref 150.0–400.0)
RBC: 4.29 Mil/uL (ref 3.87–5.11)
RDW: 13.4 % (ref 11.5–15.5)
WBC: 4.5 10*3/uL (ref 4.0–10.5)

## 2019-11-21 MED ORDER — ROPINIROLE HCL 0.5 MG PO TABS: 0.5000 mg | ORAL_TABLET | Freq: Every day | ORAL | 1 refills | Status: DC

## 2019-11-21 NOTE — Telephone Encounter (Signed)
Called to schedule angiogram, no answer, left vm. AW 

## 2019-11-21 NOTE — Patient Instructions (Addendum)
Thank you for coming in today. The average dose of Requip for restless leg syndrome is 2mg  at night.  You are currently taking 0.5 mg at night (2 of the 0.25). Ok to increase you dose by 0.25 mg every 3 days until you get up to either a good dose or 2mg .  Keep me updated on your issue.  Check back with me in 2 months.  Let me know your stable dose so I can send the correct amount to the pharmacy.    Restless Legs Syndrome Restless legs syndrome is a condition that causes uncomfortable feelings or sensations in the legs, especially while sitting or lying down. The sensations usually cause an overwhelming urge to move the legs. The arms can also sometimes be affected. The condition can range from mild to severe. The symptoms often interfere with a person's ability to sleep. What are the causes? The cause of this condition is not known. What increases the risk? The following factors may make you more likely to develop this condition:  Being older than 50.  Pregnancy.  Being a woman. In general, the condition is more common in women than in men.  A family history of the condition.  Having iron deficiency.  Overuse of caffeine, nicotine, or alcohol.  Certain medical conditions, such as kidney disease, Parkinson's disease, or nerve damage.  Certain medicines, such as those for high blood pressure, nausea, colds, allergies, depression, and some heart conditions. What are the signs or symptoms? The main symptom of this condition is uncomfortable sensations in the legs, such as:  Pulling.  Tingling.  Prickling.  Throbbing.  Crawling.  Burning. Usually, the sensations:  Affect both sides of the body.  Are worse when you sit or lie down.  Are worse at night. These may wake you up or make it difficult to fall asleep.  Make you have a strong urge to move your legs.  Are temporarily relieved by moving your legs. The arms can also be affected, but this is rare. People who  have this condition often have tiredness during the day because of their lack of sleep at night. How is this diagnosed? This condition may be diagnosed based on:  Your symptoms.  Blood tests. In some cases, you may be monitored in a sleep lab by a specialist (a sleep study). This can detect any disruptions in your sleep. How is this treated? This condition is treated by managing the symptoms. This may include:  Lifestyle changes, such as exercising, using relaxation techniques, and avoiding caffeine, alcohol, or tobacco.  Medicines. Anti-seizure medicines may be tried first. Follow these instructions at home:     General instructions  Take over-the-counter and prescription medicines only as told by your health care provider.  Use methods to help relieve the uncomfortable sensations, such as: ? Massaging your legs. ? Walking or stretching. ? Taking a cold or hot bath.  Keep all follow-up visits as told by your health care provider. This is important. Lifestyle  Practice good sleep habits. For example, go to bed and get up at the same time every day. Most adults should get 7-9 hours of sleep each night.  Exercise regularly. Try to get at least 30 minutes of exercise most days of the week.  Practice ways of relaxing, such as yoga or meditation.  Avoid caffeine and alcohol.  Do not use any products that contain nicotine or tobacco, such as cigarettes and e-cigarettes. If you need help quitting, ask your health care provider.  Contact a health care provider if:  Your symptoms get worse or they do not improve with treatment. Summary  Restless legs syndrome is a condition that causes uncomfortable feelings or sensations in the legs, especially while sitting or lying down.  The symptoms often interfere with a person's ability to sleep.  This condition is treated by managing the symptoms. You may need to make lifestyle changes or take medicines. This information is not intended  to replace advice given to you by your health care provider. Make sure you discuss any questions you have with your health care provider. Document Revised: 10/02/2017 Document Reviewed: 10/02/2017 Elsevier Patient Education  [employer].

## 2019-11-21 NOTE — Progress Notes (Signed)
   I, Wendy Poet, LAT, ATC, am serving as scribe for Dr. Lynne Leader.  Amanda Fry is a 69 y.o. female who presents to [place] at [facility] today for f/u of R lower leg pain.  She was last seen by Dr.Katara Griner on 10/21/19 for f/u and noted some improvement in her R lower leg pain.  She was thought to have some lumbar radiculopathy involving the right side.  However her most dominant issue was a sensation that she had to move her legs in the evening thought to be restless leg syndrome.  She has completed 8 sessions of PT.  She was prescribed Requip at her last visit.  Since her last visit, pt reports that her symptoms are improving.  She states that she has started taking 2 Requip/day (total dose 0.5 mg) due to her symptoms increasing over the past few nights.  This has been phenomenally helpful.  Diagnostic imaging: L-spine and R tib/fib XR- 10/21/19  Pertinent review of systems: No fevers or chills.  Relevant historical information: Family history of restless leg syndrome.   Exam:  BP 138/88 (BP Location: Left Arm, Patient Position: Sitting, Cuff Size: Large)   Pulse 76   Ht 5\' 8"  (1.727 m)   Wt 236 lb (107 kg)   SpO2 98%   BMI 35.88 kg/m  General: Well Developed, well nourished, and in no acute distress.   MSK:  Right leg normal-appearing nontender normal motion.       Assessment and Plan: 69 y.o. female with  Restless leg syndrome: Diagnosis almost certain at this point.  Patient had great response to Requip even at a very low dose of 0.25.  As instructed she has increased it to 0.5 which has been very helpful.  Typical treatment dose for Requip is 2 mg/day.  Plan to titrate 0.25 mg/day every 3 days as needed until symptom control.  New prescription written for current dose.  Patient will update me on current doses so I can update prescription.  Additionally will check iron stores as sometimes low iron stores her anemia can exacerbate or worsen restless leg  syndrome.  Back pain and a bit of lumbar radiculopathy much better.  Focus physical therapy on back pain.  Check back in 2 months.   PDMP not reviewed this encounter. Orders Placed This Encounter  Procedures  . CBC    Standing Status:   Future    Standing Expiration Date:   11/20/2020  . Iron, TIBC and Ferritin Panel    Standing Status:   Future    Standing Expiration Date:   02/18/2020   Meds ordered this encounter  Medications  . rOPINIRole (REQUIP) 0.5 MG tablet    Sig: Take 1-2 tablets (0.5-1 mg total) by mouth at bedtime.    Dispense:  90 tablet    Refill:  1     Discussed warning signs or symptoms. Please see discharge instructions. Patient expresses understanding.   The above documentation has been reviewed and is accurate and complete Lynne Leader

## 2019-11-22 ENCOUNTER — Ambulatory Visit: Payer: Medicare Other | Admitting: Physical Therapy

## 2019-11-22 ENCOUNTER — Other Ambulatory Visit: Payer: Self-pay

## 2019-11-22 DIAGNOSIS — R252 Cramp and spasm: Secondary | ICD-10-CM | POA: Diagnosis not present

## 2019-11-22 DIAGNOSIS — R262 Difficulty in walking, not elsewhere classified: Secondary | ICD-10-CM

## 2019-11-22 DIAGNOSIS — M25571 Pain in right ankle and joints of right foot: Secondary | ICD-10-CM | POA: Diagnosis not present

## 2019-11-22 LAB — IRON,TIBC AND FERRITIN PANEL
%SAT: 23 % (calc) (ref 16–45)
Ferritin: 52 ng/mL (ref 16–288)
Iron: 77 ug/dL (ref 45–160)
TIBC: 333 mcg/dL (calc) (ref 250–450)

## 2019-11-22 NOTE — Therapy (Signed)
[place], [place], [address] Phone: [phone]   Fax:  [phone]  Physical Therapy Treatment  Patient Details  Name: Amanda Fry MRN: [id number] Date of Birth: Jan 24, 1951 Referring Provider (PT): Corye   Encounter Date: 11/22/2019  PT End of Session - 11/22/19 1015    Visit Number  9    Date for PT Re-Evaluation  12/13/19    PT Start Time  0925    PT Stop Time  1005    PT Time Calculation (min)  40 min       Past Medical History:  Diagnosis Date  . Allergic rhinitis   . Anterior communicating artery aneurysm   . Anxiety   . Carpal tunnel syndrome   . Cyst of kidney, acquired   . Esophageal reflux   . Family history of adverse reaction to anesthesia    sister N/V with anesthesia  . Fibromyalgia   . GERD (gastroesophageal reflux disease)   . Headache   . HLD (hyperlipidemia)   . Hypertension   . Hypothyroidism   . Insomnia   . Plantar fasciitis    left foot  . PONV (postoperative nausea and vomiting)     Past Surgical History:  Procedure Laterality Date  . APPENDECTOMY  2001  . BREAST LUMPECTOMY Left 1994  . CESAREAN SECTION    . IR ANGIO EXTRACRAN SEL COM CAROTID INNOMINATE UNI R MOD SED  07/11/2018  . IR ANGIO INTRA EXTRACRAN SEL COM CAROTID INNOMINATE BILAT MOD SED  05/10/2018  . IR ANGIO INTRA EXTRACRAN SEL INTERNAL CAROTID UNI L MOD SED  07/11/2018  . IR ANGIO VERTEBRAL SEL SUBCLAVIAN INNOMINATE UNI R MOD SED  07/11/2018  . IR ANGIO VERTEBRAL SEL VERTEBRAL BILAT MOD SED  05/10/2018  . IR ANGIO VERTEBRAL SEL VERTEBRAL UNI L MOD SED  07/11/2018  . IR ANGIOGRAM FOLLOW UP STUDY  07/11/2018  . IR INTRAVSC STENT CERV CAROTID W/EMB-PROT MOD SED INCL ANGIO  05/15/2018  . IR NEURO EACH ADD'L AFTER BASIC UNI LEFT (MS)  07/11/2018  . IR TRANSCATH/EMBOLIZ  07/11/2018  . PARTIAL HYSTERECTOMY  1994  . RADIOLOGY WITH ANESTHESIA N/A 05/10/2018   Procedure: EMBOLIZATION;   Surgeon: Luanne Bras, MD;  Location: [facility];  Service: Radiology;  Laterality: N/A;  . RADIOLOGY WITH ANESTHESIA N/A 05/15/2018   Procedure: Angioplasty with stenting;  Surgeon: Luanne Bras, MD;  Location: [facility];  Service: Radiology;  Laterality: N/A;  . RADIOLOGY WITH ANESTHESIA N/A 07/11/2018   Procedure: IR WITH ANESTHESIAANEURYSM/EMBOLIZATION;  Surgeon: Luanne Bras, MD;  Location: [facility];  Service: Radiology;  Laterality: N/A;  . TONSILLECTOMY      There were no vitals filed for this visit.  Subjective Assessment - 11/22/19 0933    Subjective  saw MD yesterday and he felt leg was restless leg, up meds and said to focus on back, woke up with increased fibro pain this morning 4:30 am ( 8/10)- okaynow. I just cant lye down it really bothers back on the mats    Currently in Pain?  Yes    Pain Score  1     Pain Location  Back                       [facility] Adult PT Treatment/Exercise - 11/22/19 0001      Self-Care   Self-Care  ADL's    ADL's  lumbar support      Lumbar  Exercises: Aerobic   Nustep  Nusteps L 5 7 min      Lumbar Exercises: Machines for Strengthening   Cybex Lumbar Extension  black tband 2 sets 10   trunk flex for abdominals 2 sets 10   Other Lumbar Machine Exercise  row and lat pull 20# 2 sets 10      Lumbar Exercises: Seated   Other Seated Lumbar Exercises  pelvic ROM on sit fit 10 times 4 ways    LAQ,hip abd and flex with tband on sit fit. hip add ball sq.            PT Education - 11/22/19 1014    Education Details  seated pelvic stab ex. lumbar support with sitting and other ADLs    Person(s) Educated  Patient    Methods  Explanation;Demonstration;Handout    Comprehension  Verbalized understanding;Returned demonstration          PT Long Term Goals - 11/22/19 1015      PT LONG TERM GOAL #1   Title  independent with HEP    Status  Partially Met      PT LONG TERM GOAL #2   Title  report 50% better sleeping     Status  Achieved      PT LONG TERM GOAL #3   Title  report 50% less pain    Status  Partially Met            Plan - 11/22/19 1016    Clinical Impression Statement  focuse session on core stab in non weight bearing to decrease hip pain Left>RT ( lying is a hard position for pt too) also educ on lumbar support and satb esp in sitting. progressing with goals    PT Treatment/Interventions  ADLs/Self Care Home Management;Cryotherapy;Electrical Stimulation;Iontophoresis 55m/ml Dexamethasone;Moist Heat;Ultrasound;Traction;Therapeutic activities;Gait training;Therapeutic exercise;Balance training;Neuromuscular re-education;Manual techniques;Dry needling;Patient/family education    PT Next Visit Plan  progress and check HEP       Patient will benefit from skilled therapeutic intervention in order to improve the following deficits and impairments:  Abnormal gait, Pain, Increased muscle spasms, Decreased range of motion, Decreased strength, Impaired flexibility, Difficulty walking  Visit Diagnosis: Difficulty in walking, not elsewhere classified  Cramp and spasm     Problem List Patient Active Problem List   Diagnosis Date Noted  . Restless leg syndrome 11/21/2019  . Brain aneurysm 07/11/2018  . Carotid stenosis, asymptomatic, unspecified laterality 05/15/2018    Zay Yeargan,[name] PTA 11/22/2019, 10:17 AM  [place] [address]Phone: [phone]  Fax:  [phone] Name: Amanda DELPOZOMRN: [id number] of Birth: 1May 24, 1952

## 2019-11-22 NOTE — Progress Notes (Signed)
CBC is normal.  Awaiting iron stores test which should come back today or Monday.

## 2019-11-26 ENCOUNTER — Telehealth: Payer: Self-pay | Admitting: Student

## 2019-11-26 ENCOUNTER — Ambulatory Visit: Payer: Medicare Other | Attending: Internal Medicine

## 2019-11-26 ENCOUNTER — Other Ambulatory Visit (HOSPITAL_COMMUNITY): Payer: Self-pay | Admitting: Interventional Radiology

## 2019-11-26 DIAGNOSIS — I671 Cerebral aneurysm, nonruptured: Secondary | ICD-10-CM

## 2019-11-26 DIAGNOSIS — Z23 Encounter for immunization: Secondary | ICD-10-CM | POA: Insufficient documentation

## 2019-11-26 NOTE — Progress Notes (Signed)
Iron stores are adequate.  No severe anemia.

## 2019-11-26 NOTE — Progress Notes (Signed)
   Covid-19 Vaccination Clinic  Name:  Amanda Fry    MRN: [id number] DOB: [birth year]  11/26/2019  Amanda Fry was observed post Covid-19 immunization for 15 minutes without incident. She was provided with Vaccine Information Sheet and instruction to access the V-Safe system.   Amanda Fry was instructed to call 911 with any severe reactions post vaccine: Marland Kitchen Difficulty breathing  . Swelling of face and throat  . A fast heartbeat  . A bad rash all over body  . Dizziness and weakness   Immunizations Administered    Name Date Dose VIS Date Route   Pfizer COVID-19 Vaccine 11/26/2019 10:43 AM 0.3 mL 09/06/2019 Intramuscular   Manufacturer: [place]   Lot: [id number]   [place]: [id number]

## 2019-11-26 NOTE — Telephone Encounter (Signed)
NIR.  Patient is scheduled for an image-guided diagnostic cerebral arteriogram 12/10/2019 with Dr. Estanislado Pandy. She has a shellfish allergy causing swelling, requiring pre-medications for procedures with iodine dye.  Faxed filled prescriptions to [facility] pharmacy [phone]) at 0849: 1- Prednisone 50 mg tablets; take one tablet by mouth 13 hours, 7 hours, and 1 hour prior to procedure on 12/10/2019; dispense 3 tablets with 0 refills. 2- Benadryl 50 mg tablets; take one tablet by mouth 1 hour prior to procedure 12/10/2019; dispense 1 tablet with 0 refills.   Bea Graff Karmelo Bass, PA-C 11/26/2019, 9:05 AM

## 2019-11-28 ENCOUNTER — Other Ambulatory Visit: Payer: Self-pay

## 2019-11-28 ENCOUNTER — Ambulatory Visit: Payer: Medicare Other | Attending: Family Medicine | Admitting: Physical Therapy

## 2019-11-28 DIAGNOSIS — R252 Cramp and spasm: Secondary | ICD-10-CM | POA: Diagnosis not present

## 2019-11-28 DIAGNOSIS — R262 Difficulty in walking, not elsewhere classified: Secondary | ICD-10-CM

## 2019-11-28 NOTE — Therapy (Signed)
[address] [place], [place], [address] Phone: [phone]   Fax:  [phone] Progress Note Reporting Period 10/15/19 to 11/28/19 See note below for Objective Data and Assessment of Progress/Goals.      Physical Therapy Treatment  Patient Details  Name: Amanda Fry MRN: [id number] Date of Birth: May 07, 1951 Referring Provider (PT): Corye   Encounter Date: 11/28/2019  PT End of Session - 11/28/19 1728    Visit Number  10    Date for PT Re-Evaluation  12/13/19    PT Start Time  9924    PT Stop Time  1730    PT Time Calculation (min)  40 min       Past Medical History:  Diagnosis Date  . Allergic rhinitis   . Anterior communicating artery aneurysm   . Anxiety   . Carpal tunnel syndrome   . Cyst of kidney, acquired   . Esophageal reflux   . Family history of adverse reaction to anesthesia    sister N/V with anesthesia  . Fibromyalgia   . GERD (gastroesophageal reflux disease)   . Headache   . HLD (hyperlipidemia)   . Hypertension   . Hypothyroidism   . Insomnia   . Plantar fasciitis    left foot  . PONV (postoperative nausea and vomiting)     Past Surgical History:  Procedure Laterality Date  . APPENDECTOMY  2001  . BREAST LUMPECTOMY Left 1994  . CESAREAN SECTION    . IR ANGIO EXTRACRAN SEL COM CAROTID INNOMINATE UNI R MOD SED  07/11/2018  . IR ANGIO INTRA EXTRACRAN SEL COM CAROTID INNOMINATE BILAT MOD SED  05/10/2018  . IR ANGIO INTRA EXTRACRAN SEL INTERNAL CAROTID UNI L MOD SED  07/11/2018  . IR ANGIO VERTEBRAL SEL SUBCLAVIAN INNOMINATE UNI R MOD SED  07/11/2018  . IR ANGIO VERTEBRAL SEL VERTEBRAL BILAT MOD SED  05/10/2018  . IR ANGIO VERTEBRAL SEL VERTEBRAL UNI L MOD SED  07/11/2018  . IR ANGIOGRAM FOLLOW UP STUDY  07/11/2018  . IR INTRAVSC STENT CERV CAROTID W/EMB-PROT MOD SED INCL ANGIO  05/15/2018  . IR NEURO EACH ADD'L AFTER BASIC UNI LEFT (MS)  07/11/2018  . IR  TRANSCATH/EMBOLIZ  07/11/2018  . PARTIAL HYSTERECTOMY  1994  . RADIOLOGY WITH ANESTHESIA N/A 05/10/2018   Procedure: EMBOLIZATION;  Surgeon: Luanne Bras, MD;  Location: [facility];  Service: Radiology;  Laterality: N/A;  . RADIOLOGY WITH ANESTHESIA N/A 05/15/2018   Procedure: Angioplasty with stenting;  Surgeon: Luanne Bras, MD;  Location: [facility];  Service: Radiology;  Laterality: N/A;  . RADIOLOGY WITH ANESTHESIA N/A 07/11/2018   Procedure: IR WITH ANESTHESIAANEURYSM/EMBOLIZATION;  Surgeon: Luanne Bras, MD;  Location: [facility];  Service: Radiology;  Laterality: N/A;  . TONSILLECTOMY      There were no vitals filed for this visit.  Subjective Assessment - 11/28/19 1651    Subjective  feeling great today and did well after last session, working on pelvic ROM at home. 2nd vaccine Tuesday and achey and fatigue but did well    Currently in Pain?  Yes    Pain Score  1     Pain Location  Back    Pain Orientation  Right;Lateral                       [facility] Adult PT Treatment/Exercise - 11/28/19 0001      Lumbar Exercises: Aerobic   Nustep  Nusteps L  6 7 min      Lumbar Exercises: Machines for Strengthening   Cybex Lumbar Extension  black tband 2 sets 15   trunk flex and ext   Other Lumbar Machine Exercise  row and lat pull 20# 2 sets 15      Lumbar Exercises: Seated   Sit to Stand  10 reps   wt ball chest press   Other Seated Lumbar Exercises  pelvic ROM on sit fit 15 times 4 ways    stab ex as on 2/26 with 3# ankle wts                 PT Long Term Goals - 11/28/19 1731      PT LONG TERM GOAL #1   Title  independent with HEP    Status  Partially Met      PT LONG TERM GOAL #2   Title  report 50% better sleeping    Status  Achieved      PT LONG TERM GOAL #3   Title  report 50% less pain    Status  Partially Met      PT LONG TERM GOAL #4   Title  walk without limp    Status  Achieved            Plan - 11/28/19 1731    Clinical  Impression Statement  the pt is pleased with how she is responding to ex, increased reps and addded wt and pt did well. we have not yet moved into wt bearing ex. progressing with goals. pt will need to be out of therapy for 2 weeks starting week of 3/15 for surgical procedure so will place on hold and asses how she is doing upon return and porgress ex.    PT Treatment/Interventions  ADLs/Self Care Home Management;Cryotherapy;Electrical Stimulation;Iontophoresis '4mg'$ /ml Dexamethasone;Moist Heat;Ultrasound;Traction;Therapeutic activities;Gait training;Therapeutic exercise;Balance training;Neuromuscular re-education;Manual techniques;Dry needling;Patient/family education    PT Next Visit Plan  progress and HOLD after next week for 2 weeks for surgial procedure       Patient will benefit from skilled therapeutic intervention in order to improve the following deficits and impairments:  Abnormal gait, Pain, Increased muscle spasms, Decreased range of motion, Decreased strength, Impaired flexibility, Difficulty walking  Visit Diagnosis: Cramp and spasm  Difficulty in walking, not elsewhere classified     Problem List Patient Active Problem List   Diagnosis Date Noted  . Restless leg syndrome 11/21/2019  . Brain aneurysm 07/11/2018  . Carotid stenosis, asymptomatic, unspecified laterality 05/15/2018    Keefer Soulliere,[name] PTA 11/28/2019, 5:34 PM  [address] [place], [place], [address] Phone: [phone]   Fax:  [phone]  Name: Amanda Fry MRN: [id number] Date of Birth: Feb 12, 1951

## 2019-12-02 DIAGNOSIS — G4733 Obstructive sleep apnea (adult) (pediatric): Secondary | ICD-10-CM | POA: Diagnosis not present

## 2019-12-03 ENCOUNTER — Ambulatory Visit: Payer: Medicare Other | Admitting: Physical Therapy

## 2019-12-03 ENCOUNTER — Other Ambulatory Visit: Payer: Self-pay

## 2019-12-03 DIAGNOSIS — R262 Difficulty in walking, not elsewhere classified: Secondary | ICD-10-CM | POA: Diagnosis not present

## 2019-12-03 DIAGNOSIS — R252 Cramp and spasm: Secondary | ICD-10-CM | POA: Diagnosis not present

## 2019-12-03 NOTE — Therapy (Signed)
[facility] [address] [place], [place], [address] Phone: [phone]   Fax:  [phone]  Physical Therapy Treatment  Patient Details  Name: Amanda Fry MRN: [id number] Date of Birth: 1950-12-26 Referring Provider (PT): Corye   Encounter Date: 12/03/2019  PT End of Session - 12/03/19 1416    Visit Number  11    Date for PT Re-Evaluation  12/13/19    PT Start Time  1225    PT Stop Time  1315    PT Time Calculation (min)  50 min       Past Medical History:  Diagnosis Date  . Allergic rhinitis   . Anterior communicating artery aneurysm   . Anxiety   . Carpal tunnel syndrome   . Cyst of kidney, acquired   . Esophageal reflux   . Family history of adverse reaction to anesthesia    sister N/V with anesthesia  . Fibromyalgia   . GERD (gastroesophageal reflux disease)   . Headache   . HLD (hyperlipidemia)   . Hypertension   . Hypothyroidism   . Insomnia   . Plantar fasciitis    left foot  . PONV (postoperative nausea and vomiting)     Past Surgical History:  Procedure Laterality Date  . APPENDECTOMY  2001  . BREAST LUMPECTOMY Left 1994  . CESAREAN SECTION    . IR ANGIO EXTRACRAN SEL COM CAROTID INNOMINATE UNI R MOD SED  07/11/2018  . IR ANGIO INTRA EXTRACRAN SEL COM CAROTID INNOMINATE BILAT MOD SED  05/10/2018  . IR ANGIO INTRA EXTRACRAN SEL INTERNAL CAROTID UNI L MOD SED  07/11/2018  . IR ANGIO VERTEBRAL SEL SUBCLAVIAN INNOMINATE UNI R MOD SED  07/11/2018  . IR ANGIO VERTEBRAL SEL VERTEBRAL BILAT MOD SED  05/10/2018  . IR ANGIO VERTEBRAL SEL VERTEBRAL UNI L MOD SED  07/11/2018  . IR ANGIOGRAM FOLLOW UP STUDY  07/11/2018  . IR INTRAVSC STENT CERV CAROTID W/EMB-PROT MOD SED INCL ANGIO  05/15/2018  . IR NEURO EACH ADD'L AFTER BASIC UNI LEFT (MS)  07/11/2018  . IR TRANSCATH/EMBOLIZ  07/11/2018  . PARTIAL HYSTERECTOMY  1994  . RADIOLOGY WITH ANESTHESIA N/A 05/10/2018   Procedure: EMBOLIZATION;   Surgeon: Luanne Bras, MD;  Location: [facility];  Service: Radiology;  Laterality: N/A;  . RADIOLOGY WITH ANESTHESIA N/A 05/15/2018   Procedure: Angioplasty with stenting;  Surgeon: Luanne Bras, MD;  Location: [facility];  Service: Radiology;  Laterality: N/A;  . RADIOLOGY WITH ANESTHESIA N/A 07/11/2018   Procedure: IR WITH ANESTHESIAANEURYSM/EMBOLIZATION;  Surgeon: Luanne Bras, MD;  Location: [facility];  Service: Radiology;  Laterality: N/A;  . TONSILLECTOMY      There were no vitals filed for this visit.  Subjective Assessment - 12/03/19 1232    Subjective  felt good after last session and even yesterday I wwas able to do more ADLs then back starting hurting. hips are stiff today.    Currently in Pain?  Yes    Pain Score  1     Pain Location  Hip                       [facility] Adult PT Treatment/Exercise - 12/03/19 0001      Lumbar Exercises: Aerobic   Nustep  Nusteps L 6 7 min      Lumbar Exercises: Machines for Strengthening   Cybex Lumbar Extension  black tband 2 sets 15   flex and ext  Other Lumbar Machine Exercise  row and lat pull 20# 2 sets 15      Lumbar Exercises: Standing   Other Standing Lumbar Exercises  wt ball trunk rotattion 10 each  (Pended)       Lumbar Exercises: Seated   Sit to Stand  10 reps  (Pended)    with wt ball chest press   Other Seated Lumbar Exercises  pelvic ROM on sit fit 15 times 4 ways   (Pended)    stab ex with blue tband     Knee/Hip Exercises: Standing   Other Standing Knee Exercises  stepping fwd/back over foam roll 3# 10 x  (Pended)    stepping lateral over foam roll 3# 10 reps                 PT Long Term Goals - 11/28/19 1731      PT LONG TERM GOAL #1   Title  independent with HEP    Status  Partially Met      PT LONG TERM GOAL #2   Title  report 50% better sleeping    Status  Achieved      PT LONG TERM GOAL #3   Title  report 50% less pain    Status  Partially Met      PT LONG TERM GOAL  #4   Title  walk without limp    Status  Achieved            Plan - 12/03/19 1417    Clinical Impression Statement  pt is doing weel and responding to slow progression of ex well, added standing ex today which in past has increased paina nd she did well today.    PT Treatment/Interventions  ADLs/Self Care Home Management;Cryotherapy;Electrical Stimulation;Iontophoresis '4mg'$ /ml Dexamethasone;Moist Heat;Ultrasound;Traction;Therapeutic activities;Gait training;Therapeutic exercise;Balance training;Neuromuscular re-education;Manual techniques;Dry needling;Patient/family education    PT Next Visit Plan  asses how she felt after today tx and progress and check goals       Patient will benefit from skilled therapeutic intervention in order to improve the following deficits and impairments:  Abnormal gait, Pain, Increased muscle spasms, Decreased range of motion, Decreased strength, Impaired flexibility, Difficulty walking  Visit Diagnosis: Cramp and spasm  Difficulty in walking, not elsewhere classified     Problem List Patient Active Problem List   Diagnosis Date Noted  . Restless leg syndrome 11/21/2019  . Brain aneurysm 07/11/2018  . Carotid stenosis, asymptomatic, unspecified laterality 05/15/2018    Lajuan Kovaleski,[name] PTA 12/03/2019, 2:18 PM  [address] [place], [place], [address] Phone: [phone]   Fax:  [phone]  Name: Amanda Fry MRN: [id number] Date of Birth: [birth year]

## 2019-12-05 ENCOUNTER — Ambulatory Visit: Payer: Medicare Other | Admitting: Physical Therapy

## 2019-12-05 ENCOUNTER — Other Ambulatory Visit: Payer: Self-pay

## 2019-12-05 DIAGNOSIS — R252 Cramp and spasm: Secondary | ICD-10-CM

## 2019-12-05 DIAGNOSIS — R262 Difficulty in walking, not elsewhere classified: Secondary | ICD-10-CM | POA: Diagnosis not present

## 2019-12-05 NOTE — Therapy (Signed)
[facility] [address] [place], [place], [address] Phone: [phone]   Fax:  [phone]  Physical Therapy Treatment  Patient Details  Name: Amanda Fry MRN: [id number] Date of Birth: [birth year] Referring Provider (PT): Corye   Encounter Date: 12/05/2019  PT End of Session - 12/05/19 1546    Visit Number  12    Date for PT Re-Evaluation  12/13/19    PT Start Time  6599    PT Stop Time  3570    PT Time Calculation (min)  45 min       Past Medical History:  Diagnosis Date  . Allergic rhinitis   . Anterior communicating artery aneurysm   . Anxiety   . Carpal tunnel syndrome   . Cyst of kidney, acquired   . Esophageal reflux   . Family history of adverse reaction to anesthesia    sister N/V with anesthesia  . Fibromyalgia   . GERD (gastroesophageal reflux disease)   . Headache   . HLD (hyperlipidemia)   . Hypertension   . Hypothyroidism   . Insomnia   . Plantar fasciitis    left foot  . PONV (postoperative nausea and vomiting)     Past Surgical History:  Procedure Laterality Date  . APPENDECTOMY  2001  . BREAST LUMPECTOMY Left 1994  . CESAREAN SECTION    . IR ANGIO EXTRACRAN SEL COM CAROTID INNOMINATE UNI R MOD SED  07/11/2018  . IR ANGIO INTRA EXTRACRAN SEL COM CAROTID INNOMINATE BILAT MOD SED  05/10/2018  . IR ANGIO INTRA EXTRACRAN SEL INTERNAL CAROTID UNI L MOD SED  07/11/2018  . IR ANGIO VERTEBRAL SEL SUBCLAVIAN INNOMINATE UNI R MOD SED  07/11/2018  . IR ANGIO VERTEBRAL SEL VERTEBRAL BILAT MOD SED  05/10/2018  . IR ANGIO VERTEBRAL SEL VERTEBRAL UNI L MOD SED  07/11/2018  . IR ANGIOGRAM FOLLOW UP STUDY  07/11/2018  . IR INTRAVSC STENT CERV CAROTID W/EMB-PROT MOD SED INCL ANGIO  05/15/2018  . IR NEURO EACH ADD'L AFTER BASIC UNI LEFT (MS)  07/11/2018  . IR TRANSCATH/EMBOLIZ  07/11/2018  . PARTIAL HYSTERECTOMY  1994  . RADIOLOGY WITH ANESTHESIA N/A 05/10/2018   Procedure: EMBOLIZATION;   Surgeon: Luanne Bras, MD;  Location: [facility];  Service: Radiology;  Laterality: N/A;  . RADIOLOGY WITH ANESTHESIA N/A 05/15/2018   Procedure: Angioplasty with stenting;  Surgeon: Luanne Bras, MD;  Location: [facility];  Service: Radiology;  Laterality: N/A;  . RADIOLOGY WITH ANESTHESIA N/A 07/11/2018   Procedure: IR WITH ANESTHESIAANEURYSM/EMBOLIZATION;  Surgeon: Luanne Bras, MD;  Location: [facility];  Service: Radiology;  Laterality: N/A;  . TONSILLECTOMY      There were no vitals filed for this visit.  Subjective Assessment - 12/05/19 1515    Subjective  doing good, legs alittle stiff    Currently in Pain?  Yes    Pain Score  1     Pain Location  Hip                       [facility] Adult PT Treatment/Exercise - 12/05/19 0001      Lumbar Exercises: Aerobic   Nustep  Nusteps L 6 9 min      Lumbar Exercises: Machines for Strengthening   Cybex Lumbar Extension  black tband 2 sets 15   trunk flexion 2 sets 15   Other Lumbar Machine Exercise  row and lat pull 20# 2 sets 15  Lumbar Exercises: Standing   Other Standing Lumbar Exercises  AR press with cable pulley    Other Standing Lumbar Exercises  resisted gait fwd/back and SW      Lumbar Exercises: Seated   Sit to Stand  15 reps   wt ball chest press   Other Seated Lumbar Exercises  pelvic ROM on sit fit 15 times 4 ways       Knee/Hip Exercises: Standing   Hip Flexion  Stengthening;Both;15 reps;Knee straight   red tband   Hip Abduction  Stengthening;Both;15 reps;Knee straight   red tband   Hip Extension  Stengthening;Both;Knee straight;15 reps   red tband                 PT Long Term Goals - 12/05/19 1547      PT LONG TERM GOAL #1   Title  independent with HEP    Status  Partially Met      PT LONG TERM GOAL #2   Title  report 50% better sleeping    Status  Achieved      PT LONG TERM GOAL #3   Title  report 50% less pain    Status  Partially Met      PT LONG TERM GOAL #4    Title  walk without limp    Status  Achieved            Plan - 12/05/19 1547    Clinical Impression Statement  pt is doing much better and tolerating increased activity and progression, working towards goals. pt will be having procedure done next week and no physcial activity 2 weeks.    PT Treatment/Interventions  ADLs/Self Care Home Management;Cryotherapy;Electrical Stimulation;Iontophoresis 65m/ml Dexamethasone;Moist Heat;Ultrasound;Traction;Therapeutic activities;Gait training;Therapeutic exercise;Balance training;Neuromuscular re-education;Manual techniques;Dry needling;Patient/family education    PT Next Visit Plan  HOLD 2 weeks d/t medical procedure . will assess how she is doing when she returns       Patient will benefit from skilled therapeutic intervention in order to improve the following deficits and impairments:  Abnormal gait, Pain, Increased muscle spasms, Decreased range of motion, Decreased strength, Impaired flexibility, Difficulty walking  Visit Diagnosis: Difficulty in walking, not elsewhere classified  Cramp and spasm     Problem List Patient Active Problem List   Diagnosis Date Noted  . Restless leg syndrome 11/21/2019  . Brain aneurysm 07/11/2018  . Carotid stenosis, asymptomatic, unspecified laterality 05/15/2018    Sharlon Pfohl,[name] PTA 12/05/2019, 3:50 PM  [address] [place] [address]Phone: [phone]  Fax:  [phone] Name: Amanda LAROCCAMRN: [id number] of Birth: [birth year]

## 2019-12-09 ENCOUNTER — Other Ambulatory Visit: Payer: Self-pay | Admitting: Student

## 2019-12-10 ENCOUNTER — Ambulatory Visit (HOSPITAL_COMMUNITY)
Admission: RE | Admit: 2019-12-10 | Discharge: 2019-12-10 | Disposition: A | Discharge status: Home / Self Care | Payer: Medicare Other | Source: Ambulatory Visit | Attending: Interventional Radiology | Admitting: Interventional Radiology

## 2019-12-10 ENCOUNTER — Other Ambulatory Visit: Payer: Self-pay

## 2019-12-10 ENCOUNTER — Encounter (HOSPITAL_COMMUNITY): Payer: Self-pay

## 2019-12-10 ENCOUNTER — Other Ambulatory Visit (HOSPITAL_COMMUNITY): Payer: Self-pay | Admitting: Interventional Radiology

## 2019-12-10 DIAGNOSIS — K219 Gastro-esophageal reflux disease without esophagitis: Secondary | ICD-10-CM | POA: Insufficient documentation

## 2019-12-10 DIAGNOSIS — Z7902 Long term (current) use of antithrombotics/antiplatelets: Secondary | ICD-10-CM | POA: Diagnosis not present

## 2019-12-10 DIAGNOSIS — M797 Fibromyalgia: Secondary | ICD-10-CM | POA: Insufficient documentation

## 2019-12-10 DIAGNOSIS — E039 Hypothyroidism, unspecified: Secondary | ICD-10-CM | POA: Diagnosis not present

## 2019-12-10 DIAGNOSIS — Z7982 Long term (current) use of aspirin: Secondary | ICD-10-CM | POA: Insufficient documentation

## 2019-12-10 DIAGNOSIS — Z87891 Personal history of nicotine dependence: Secondary | ICD-10-CM | POA: Insufficient documentation

## 2019-12-10 DIAGNOSIS — Z79890 Hormone replacement therapy (postmenopausal): Secondary | ICD-10-CM | POA: Insufficient documentation

## 2019-12-10 DIAGNOSIS — I671 Cerebral aneurysm, nonruptured: Secondary | ICD-10-CM | POA: Insufficient documentation

## 2019-12-10 DIAGNOSIS — F419 Anxiety disorder, unspecified: Secondary | ICD-10-CM | POA: Diagnosis not present

## 2019-12-10 DIAGNOSIS — I10 Essential (primary) hypertension: Secondary | ICD-10-CM | POA: Diagnosis not present

## 2019-12-10 DIAGNOSIS — E785 Hyperlipidemia, unspecified: Secondary | ICD-10-CM | POA: Insufficient documentation

## 2019-12-10 DIAGNOSIS — Z79899 Other long term (current) drug therapy: Secondary | ICD-10-CM | POA: Insufficient documentation

## 2019-12-10 HISTORY — PX: IR ANGIO INTRA EXTRACRAN SEL COM CAROTID INNOMINATE BILAT MOD SED: IMG5360

## 2019-12-10 HISTORY — PX: IR US GUIDE VASC ACCESS RIGHT: IMG2390

## 2019-12-10 HISTORY — PX: IR ANGIO VERTEBRAL SEL VERTEBRAL UNI R MOD SED: IMG5368

## 2019-12-10 LAB — BASIC METABOLIC PANEL
Anion gap: 11 (ref 5–15)
BUN: 16 mg/dL (ref 8–23)
CO2: 21 mmol/L — ABNORMAL LOW (ref 22–32)
Calcium: 9.7 mg/dL (ref 8.9–10.3)
Chloride: 108 mmol/L (ref 98–111)
Creatinine, Ser: 1.1 mg/dL — ABNORMAL HIGH (ref 0.44–1.00)
GFR calc Af Amer: >60 mL/min (ref >60)
GFR calc non Af Amer: 52 mL/min — ABNORMAL LOW (ref >60)
Glucose, Bld: 166 mg/dL — ABNORMAL HIGH (ref 70–99)
Potassium: 4.5 mmol/L (ref 3.5–5.1)
Sodium: 140 mmol/L (ref 135–145)

## 2019-12-10 LAB — PROTIME-INR
INR: 1 (ref 0.8–1.2)
Prothrombin Time: 12.7 seconds (ref 11.4–15.2)

## 2019-12-10 LAB — CBC
HCT: 43.6 % (ref 36.0–46.0)
Hemoglobin: 14.5 g/dL (ref 12.0–15.0)
MCH: 31 pg (ref 26.0–34.0)
MCHC: 33.3 g/dL (ref 30.0–36.0)
MCV: 93.2 fL (ref 80.0–100.0)
Platelets: 232 10*3/uL (ref 150–400)
RBC: 4.68 MIL/uL (ref 3.87–5.11)
RDW: 12.2 % (ref 11.5–15.5)
WBC: 6.2 10*3/uL (ref 4.0–10.5)
nRBC: 0 % (ref 0.0–0.2)

## 2019-12-10 LAB — APTT: aPTT: 31 seconds (ref 24–36)

## 2019-12-10 MED ORDER — FENTANYL CITRATE (PF) 100 MCG/2ML IJ SOLN
INTRAMUSCULAR | Status: AC
  Filled 2019-12-10: qty 2

## 2019-12-10 MED ORDER — VERAPAMIL HCL 2.5 MG/ML IV SOLN
INTRAVENOUS | Status: AC
  Filled 2019-12-10: qty 2

## 2019-12-10 MED ORDER — FENTANYL CITRATE (PF) 100 MCG/2ML IJ SOLN
INTRAMUSCULAR | Status: AC | PRN
  Administered 2019-12-10 (×2): 25 ug via INTRAVENOUS

## 2019-12-10 MED ORDER — LIDOCAINE HCL (PF) 1 % IJ SOLN
INTRAMUSCULAR | Status: AC | PRN
  Administered 2019-12-10: 5 mL

## 2019-12-10 MED ORDER — IOHEXOL 300 MG/ML  SOLN
150.0000 mL | Freq: Once | INTRAMUSCULAR | Status: AC | PRN
  Administered 2019-12-10: 65 mL via INTRA_ARTERIAL

## 2019-12-10 MED ORDER — HEPARIN SODIUM (PORCINE) 1000 UNIT/ML IJ SOLN
INTRAMUSCULAR | Status: AC
  Administered 2019-12-10: 2000 [IU]
  Filled 2019-12-10: qty 1

## 2019-12-10 MED ORDER — LIDOCAINE HCL 1 % IJ SOLN
INTRAMUSCULAR | Status: AC
  Filled 2019-12-10: qty 20

## 2019-12-10 MED ORDER — SODIUM CHLORIDE 0.9 % IV SOLN
INTRAVENOUS | Status: AC | PRN
  Administered 2019-12-10: 75 mL/h via INTRAVENOUS

## 2019-12-10 MED ORDER — MIDAZOLAM HCL 2 MG/2ML IJ SOLN
INTRAMUSCULAR | Status: AC
  Filled 2019-12-10: qty 2

## 2019-12-10 MED ORDER — MIDAZOLAM HCL 2 MG/2ML IJ SOLN
INTRAMUSCULAR | Status: AC | PRN
  Administered 2019-12-10 (×2): 1 mg via INTRAVENOUS

## 2019-12-10 MED ORDER — HEPARIN SODIUM (PORCINE) 1000 UNIT/ML IJ SOLN
INTRAMUSCULAR | Status: AC | PRN
  Administered 2019-12-10: 2000 [IU] via INTRAVENOUS

## 2019-12-10 MED ORDER — NITROGLYCERIN 1 MG/10 ML FOR IR/CATH LAB
INTRA_ARTERIAL | Status: AC
  Filled 2019-12-10: qty 10

## 2019-12-10 MED ORDER — NITROGLYCERIN 1 MG/10 ML FOR IR/CATH LAB
INTRA_ARTERIAL | Status: AC | PRN
  Administered 2019-12-10 (×2): 200 ug via INTRA_ARTERIAL

## 2019-12-10 MED ORDER — VERAPAMIL HCL 2.5 MG/ML IV SOLN
INTRAVENOUS | Status: AC | PRN
  Administered 2019-12-10: 2.5 mg via INTRA_ARTERIAL

## 2019-12-10 MED ORDER — SODIUM CHLORIDE 0.9 % IV SOLN: INTRAVENOUS | Status: AC

## 2019-12-10 MED ORDER — SODIUM CHLORIDE 0.9 % IV SOLN: INTRAVENOUS | Status: DC

## 2019-12-10 MED ORDER — IOHEXOL 300 MG/ML  SOLN
50.0000 mL | Freq: Once | INTRAMUSCULAR | Status: AC | PRN
  Administered 2019-12-10: 5 mL via INTRA_ARTERIAL

## 2019-12-10 NOTE — Sedation Documentation (Signed)
Right radial sheath removed. TR band applied

## 2019-12-10 NOTE — H&P (Signed)
Chief Complaint: Patient was seen in consultation today for cerebral arteriogram    Supervising Physician: Luanne Bras  Patient Status: [facility] - Out-pt  History of Present Illness: Amanda Fry is a 69 y.o. female   Known to NIR ACOM Aneurysm embolization 06/2018  MRA 03/2019 : IMPRESSION: 1 mm focus of flow signal at the treated anterior communicating artery. No new abnormality.  Pt denies any complaints Has occasional temporal headaches Tylenol eases pain Denies N/CV; Denies vision or speech changes Has some Rt hand numbness-- not new  Now scheduled for follow up cerebral arteriogram Has taken pre meds for dye allergy  Past Medical History:  Diagnosis Date  . Allergic rhinitis   . Anterior communicating artery aneurysm   . Anxiety   . Carpal tunnel syndrome   . Cyst of kidney, acquired   . Esophageal reflux   . Family history of adverse reaction to anesthesia    sister N/V with anesthesia  . Fibromyalgia   . GERD (gastroesophageal reflux disease)   . Headache   . HLD (hyperlipidemia)   . Hypertension   . Hypothyroidism   . Insomnia   . Plantar fasciitis    left foot  . PONV (postoperative nausea and vomiting)     Past Surgical History:  Procedure Laterality Date  . APPENDECTOMY  2001  . BREAST LUMPECTOMY Left 1994  . CESAREAN SECTION    . IR ANGIO EXTRACRAN SEL COM CAROTID INNOMINATE UNI R MOD SED  07/11/2018  . IR ANGIO INTRA EXTRACRAN SEL COM CAROTID INNOMINATE BILAT MOD SED  05/10/2018  . IR ANGIO INTRA EXTRACRAN SEL INTERNAL CAROTID UNI L MOD SED  07/11/2018  . IR ANGIO VERTEBRAL SEL SUBCLAVIAN INNOMINATE UNI R MOD SED  07/11/2018  . IR ANGIO VERTEBRAL SEL VERTEBRAL BILAT MOD SED  05/10/2018  . IR ANGIO VERTEBRAL SEL VERTEBRAL UNI L MOD SED  07/11/2018  . IR ANGIOGRAM FOLLOW UP STUDY  07/11/2018  . IR INTRAVSC STENT CERV CAROTID W/EMB-PROT MOD SED INCL ANGIO  05/15/2018  . IR NEURO EACH ADD'L AFTER BASIC UNI LEFT (MS)  07/11/2018  . IR  TRANSCATH/EMBOLIZ  07/11/2018  . PARTIAL HYSTERECTOMY  1994  . RADIOLOGY WITH ANESTHESIA N/A 05/10/2018   Procedure: EMBOLIZATION;  Surgeon: Luanne Bras, MD;  Location: [place];  Service: Radiology;  Laterality: N/A;  . RADIOLOGY WITH ANESTHESIA N/A 05/15/2018   Procedure: Angioplasty with stenting;  Surgeon: Luanne Bras, MD;  Location: [place];  Service: Radiology;  Laterality: N/A;  . RADIOLOGY WITH ANESTHESIA N/A 07/11/2018   Procedure: IR WITH ANESTHESIAANEURYSM/EMBOLIZATION;  Surgeon: Luanne Bras, MD;  Location: [place];  Service: Radiology;  Laterality: N/A;  . TONSILLECTOMY      Allergies: Clams [shellfish allergy], Augmentin [amoxicillin-pot clavulanate], and Biaxin [clarithromycin]  Medications: Prior to Admission medications   Medication Sig Start Date End Date Taking? Authorizing Provider  acetaminophen (TYLENOL) 500 MG tablet Take 500 mg by mouth every 6 (six) hours as needed for moderate pain or headache.   Yes [provider]  aspirin EC 325 MG tablet Take 325 mg by mouth at bedtime.   Yes [provider]  atorvastatin (LIPITOR) 40 MG tablet Take 1 tablet (40 mg total) by mouth daily. Patient taking differently: Take 40 mg by mouth every evening.  08/15/17 12/04/19 Yes Josue Hector, MD  AYR SALINE NASAL NA Place 1 spray into the nose daily as needed (congestion).   Yes [provider]  Calcium Carb-Cholecalciferol (CALCIUM 600+D) 600-800 MG-UNIT TABS  Take 1 tablet by mouth at bedtime.   Yes [provider]  calcium carbonate (TUMS EX) 750 MG chewable tablet Chew 750 mg by mouth daily.   Yes [provider]  cetirizine (ZYRTEC) 10 MG tablet Take 10 mg by mouth at bedtime.    Yes [provider]  cholecalciferol (VITAMIN D) 1000 units tablet Take 1,000 Units by mouth 2 (two) times daily.   Yes [provider]  clopidogrel (PLAVIX) 75 MG tablet Take 75 mg by mouth at bedtime.   Yes [provider]  diclofenac Sodium (VOLTAREN) 1 % GEL Apply 1 application topically 4 (four) times daily as needed (pain).   Yes [provider]  famotidine (PEPCID) 20 MG tablet Take 20 mg by mouth at bedtime.   Yes [provider]  Glucos-Chond-Hyal Ac-Ca Fructo (MOVE FREE JOINT HEALTH ADVANCE PO) Take 1 tablet by mouth at bedtime.    Yes [provider]  Liniments (BLUE-EMU SUPER STRENGTH EX) Apply 1 application topically daily as needed (back pain).   Yes [provider]  Magnesium 500 MG CAPS Take 500 mg by mouth at bedtime.    Yes [provider]  Melatonin 5 MG CAPS Take 5 mg by mouth at bedtime.   Yes [provider]  Menthol, Topical Analgesic, (BIOFREEZE EX) Apply 1 application topically daily as needed (back pain).   Yes [provider]  Polyethyl Glycol-Propyl Glycol (SYSTANE OP) Place 1 drop into both eyes daily as needed (irritation).   Yes [provider]  propranolol ER (INDERAL LA) 80 MG 24 hr capsule Take 1 capsule (80 mg total) by mouth daily. 01/24/17  Yes Tat, Eustace Quail, DO  rOPINIRole (REQUIP) 0.25 MG tablet Take 0.25-0.5 mg by mouth at bedtime.   Yes [provider]  SUMAtriptan (IMITREX) 100 MG tablet Take 1 tablet (100 mg total) by mouth once. May repeat in 2 hours if headache persists or recurs. Not to exceed 2 per day. 03/16/16  Yes Tat, Eustace Quail, DO  SYNTHROID 112 MCG tablet Take 112 mcg by mouth daily. 01/18/17  Yes [provider]  vitamin B-12 (CYANOCOBALAMIN) 500 MCG tablet Take 500 mcg by mouth daily.   Yes [provider]  zolpidem (AMBIEN) 5 MG tablet Take 5 mg at bedtime by mouth.   Yes [provider]  rOPINIRole (REQUIP) 0.5 MG tablet Take 1-2 tablets (0.5-1 mg total) by mouth at bedtime. 11/21/19   Gregor Hams, MD     Family History  Problem Relation Age of Onset  . CAD Father 64       CABG  . Cancer Mother        BREAST  . Restless legs syndrome Mother   .  Healthy Brother   . Healthy Brother   . Diabetes Sister   . Cancer Sister        BREAST  . Restless legs syndrome Sister   . Healthy Son   . Healthy Daughter   . Heart disease Maternal Grandmother   . Heart disease Maternal Grandfather   . Emphysema Paternal Grandfather     Social History   Socioeconomic History  . Marital status: Married    Spouse name: Not on file  . Number of children: Not on file  . Years of education: Not on file  . Highest education level: Not on file  Occupational History  . Not on file  Tobacco Use  . Smoking status: Former Smoker    Packs/day:  0.50    Years: 10.00    Pack years: 5.00    Types: Cigarettes    Quit date: 09/26/2009    Years since quitting: 10.2  . Smokeless tobacco: Never Used  Substance and Sexual Activity  . Alcohol use: Yes    Alcohol/week: 7.0 standard drinks    Types: 7 Glasses of wine per week    Comment: not since April 30, 2018  . Drug use: No  . Sexual activity: Not on file  Other Topics Concern  . Not on file  Social History Narrative  . Not on file   Social Determinants of Health   Financial Resource Strain:   . Difficulty of Paying Living Expenses:   Food Insecurity:   . Worried About Charity fundraiser in the Last Year:   . Arboriculturist in the Last Year:   Transportation Needs:   . Film/video editor (Medical):   Marland Kitchen Lack of Transportation (Non-Medical):   Physical Activity:   . Days of Exercise per Week:   . Minutes of Exercise per Session:   Stress:   . Feeling of Stress :   Social Connections:   . Frequency of Communication with Friends and Family:   . Frequency of Social Gatherings with Friends and Family:   . Attends Religious Services:   . Active Member of Clubs or Organizations:   . Attends Archivist Meetings:   Marland Kitchen Marital Status:     Review of Systems: A 12 point ROS discussed and pertinent positives are indicated in the HPI above.  All other systems are negative.  Review  of Systems  Constitutional: Negative for activity change, fatigue and fever.  HENT: Negative for hearing loss and tinnitus.   Eyes: Negative for visual disturbance.  Respiratory: Negative for cough and shortness of breath.   Cardiovascular: Negative for chest pain.  Gastrointestinal: Negative for abdominal pain.  Musculoskeletal: Positive for back pain. Negative for gait problem.  Neurological: Negative for dizziness, tremors, seizures, syncope, facial asymmetry, speech difficulty, weakness, light-headedness, numbness and headaches.  Psychiatric/Behavioral: Negative for behavioral problems and confusion.    Vital Signs: BP 132/85   Pulse (!) 103   Temp 98.1 F (36.7 C) (Oral)   Resp 16   Ht 5\' 8"  (1.727 m)   Wt 225 lb (102.1 kg)   SpO2 95%   BMI 34.21 kg/m   Physical Exam Vitals reviewed.  HENT:     Mouth/Throat:     Mouth: Mucous membranes are moist.  Eyes:     Extraocular Movements: Extraocular movements intact.  Cardiovascular:     Rate and Rhythm: Normal rate and regular rhythm.     Heart sounds: Normal heart sounds.  Pulmonary:     Effort: Pulmonary effort is normal.     Breath sounds: Normal breath sounds.  Abdominal:     Palpations: Abdomen is soft.     Tenderness: There is no abdominal tenderness.  Musculoskeletal:        General: Normal range of motion.     Cervical back: Normal range of motion.     Right lower leg: No edema.     Left lower leg: No edema.  Skin:    General: Skin is warm and dry.  Neurological:     Mental Status: She is alert and oriented to person, place, and time.  Psychiatric:        Behavior: Behavior normal.        Thought Content: Thought content  normal.        Judgment: Judgment normal.     Imaging: No results found.  Labs:  CBC: Recent Labs    11/21/19 1021  WBC 4.5  HGB 13.5  HCT 39.9  PLT 188.0    COAGS: No results for input(s): INR, APTT in the last 8760 hours.  BMP: No results for input(s): NA, K, CL, CO2,  GLUCOSE, BUN, CALCIUM, CREATININE, GFRNONAA, GFRAA in the last 8760 hours.  Invalid input(s): CMP  LIVER FUNCTION TESTS: No results for input(s): BILITOT, AST, ALT, ALKPHOS, PROT, ALBUMIN in the last 8760 hours.  TUMOR MARKERS: No results for input(s): AFPTM, CEA, CA199, CHROMGRNA in the last 8760 hours.  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 Now follow up cerebral arteriogram today Risks and benefits of cerebral angiogram with intervention were discussed with the patient including, but not limited to bleeding, infection, vascular injury, contrast induced renal failure, stroke or even death.  This interventional procedure involves the use of X-rays and because of the nature of the planned procedure, it is possible that we will have prolonged use of X-ray fluoroscopy.  Potential radiation risks to you include (but are not limited to) the following: - A slightly elevated risk for cancer  several years later in life. This risk is typically less than 0.5% percent. This risk is low in comparison to the normal incidence of human cancer, which is 33% for women and 50% for men according to the [place]. - Radiation induced injury can include skin redness, resembling a rash, tissue breakdown / ulcers and hair loss (which can be temporary or permanent).   The likelihood of either of these occurring depends on the difficulty of the procedure and whether you are sensitive to radiation due to previous procedures, disease, or genetic conditions.   IF your procedure requires a prolonged use of radiation, you will be notified and given written instructions for further action.  It is your responsibility to monitor the irradiated area for the 2 weeks following the procedure and to notify your physician if you are concerned that you have suffered a  radiation induced injury.    All of the patient's questions were answered, patient is agreeable to proceed. Consent signed and in chart.  Thank you for this interesting consult.  I greatly enjoyed meeting LUZ REITMEIER and look forward to participating in their care.  A copy of this report was sent to the requesting provider on this date.  Electronically Signed: Lavonia Drafts, PA-C 12/10/2019, 7:58 AM   I spent a total of    25 Minutes in face to face in clinical consultation, greater than 50% of which was counseling/coordinating care for cerebral arteriogram

## 2019-12-10 NOTE — Sedation Documentation (Signed)
Called to give report. Nurse unavailable. Will call back 

## 2019-12-10 NOTE — Sedation Documentation (Signed)
ETC02 removed per Dr. Deveshwar  

## 2019-12-10 NOTE — Procedures (Signed)
S/P RT [facility] and Bilateral arteriograms. RT Rad approach. Findings. 1.Obliterated ACOM aneurysn with patent stent .  2.Patentt Lt ICA stent with no intrastent stenosis. 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

## 2020-01-09 DIAGNOSIS — G4700 Insomnia, unspecified: Secondary | ICD-10-CM | POA: Diagnosis not present

## 2020-01-09 DIAGNOSIS — E782 Mixed hyperlipidemia: Secondary | ICD-10-CM | POA: Diagnosis not present

## 2020-01-09 DIAGNOSIS — I10 Essential (primary) hypertension: Secondary | ICD-10-CM | POA: Diagnosis not present

## 2020-01-09 DIAGNOSIS — E039 Hypothyroidism, unspecified: Secondary | ICD-10-CM | POA: Diagnosis not present

## 2020-01-20 ENCOUNTER — Ambulatory Visit (INDEPENDENT_AMBULATORY_CARE_PROVIDER_SITE_OTHER): Payer: Medicare Other | Admitting: Family Medicine

## 2020-01-20 ENCOUNTER — Other Ambulatory Visit: Payer: Self-pay

## 2020-01-20 ENCOUNTER — Encounter: Payer: Self-pay | Admitting: Family Medicine

## 2020-01-20 VITALS — BP 130/80 | HR 78 | Ht 68.0 in | Wt 235.0 lb

## 2020-01-20 DIAGNOSIS — G2581 Restless legs syndrome: Secondary | ICD-10-CM | POA: Diagnosis not present

## 2020-01-20 DIAGNOSIS — G8929 Other chronic pain: Secondary | ICD-10-CM

## 2020-01-20 DIAGNOSIS — M545 Low back pain, unspecified: Secondary | ICD-10-CM

## 2020-01-20 MED ORDER — LORAZEPAM 0.5 MG PO TABS: ORAL_TABLET | ORAL | 0 refills | Status: DC

## 2020-01-20 MED ORDER — ROPINIROLE HCL 0.25 MG PO TABS: 0.2500 mg | ORAL_TABLET | Freq: Every day | ORAL | 3 refills | Status: DC

## 2020-01-20 NOTE — Patient Instructions (Addendum)
Thank you for coming in today. Plan for MRI.  Recheck with me after MRI.  Keep me updated.   Let me know if you dont hear about scheduling MRI by the end of the week.   Once you know when the MRI is schedule with me a day or two after.   OK to continue Requip 0.25 mg at night or up to 0.5mg  at night.

## 2020-01-20 NOTE — Progress Notes (Signed)
I, Wendy Poet, LAT, ATC, am serving as scribe for Dr. Lynne Leader.  Amanda Fry is a 69 y.o. female who presents to [place] at [facility] today for f/u of R lower leg pain.  She was last seen by Dr. Georgina Snell on 11/21/19 and noted improvement in her symptoms after beginning to take Requip.  She has completed 12 PT sessions.  Since her last visit, pt reports that she's "good in terms of the R leg pain."  She reports increased low back pain w/ radiating pain into her B hips.  She denies any numbness/tingling and weakness into her B LEs.  She has been taking Tylenol and turmeric and con't to take the Requip.  She has been using ice as well.  He low back pain worsens w/ prolonged walking and standing.  She has done quite a bit of physical therapy but notes the pain has continued to return.  Pain located bilateral lower back extending to the buttocks.  No radiating down the legs.   She notes she was also seen previously for restless leg syndrome.  She currently is taking Requip 0.25 mg at bedtime sometimes 0.5 mg and tolerating it well.  She finds it helpful.   Diagnostic testing: L-spine and R tib/fib XR- 10/21/19   Pertinent review of systems: No fevers or chills  Relevant historical information: Carotid artery stent and brain stent and coil 2019.  MRI compatible reportedly.  Currently on aspirin and Plavix. Also shellfish allergy needs Benadryl and prednisone with IV contrast   Exam:  BP 130/80 (BP Location: Right Arm, Patient Position: Sitting, Cuff Size: Large)   Pulse 78   Ht 5\' 8"  (1.727 m)   Wt 235 lb (106.6 kg)   SpO2 97%   BMI 35.73 kg/m  General: Well Developed, well nourished, and in no acute distress.   MSK: L-spine nontender midline.  Decreased lumbar motion to extension.  Normal gait.    Lab and Radiology Results  EXAM: LUMBAR SPINE - COMPLETE 4+ VIEW  COMPARISON:  CT abdomen and pelvis - 01/26/2012  FINDINGS: There are 6 non rib-bearing  lumbar type vertebral bodies. For the purposes of this dictation, lumbar levels be labeled L1-L6.  Normal alignment of the lumbar spine. No anterolisthesis or retrolisthesis  Lumbar vertebral body heights are preserved.  Mild to moderate multilevel lumbar spine DDD, worse at L5-L6 and to a lesser extent, L3-L4 and L4-L5 with disc space height loss, endplate irregularity and sclerosis.  Bilateral facet degenerative change of the lower lumbar spine.  Limited visualization of the bilateral SI joints is normal. Atherosclerotic plaque within the abdominal aorta. 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  Moderate colonic stool burden without evidence of enteric obstruction. Punctate phlebolith overlies the left hemipelvis.  IMPRESSION: 1. 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 2. Mild-to-moderate multilevel lumbar spine DDD, worse at L5-L6.   Electronically Signed   By: Sandi Mariscal M.D.   On: 10/21/2019 10:19  I, Lynne Leader, personally (independently) visualized and performed the interpretation of the images attached in this note.     Assessment and Plan: 69 y.o. female with chronic low back pain failing physical therapy greater than 6 weeks.  At this point she is willing to consider facet joint injections therefore reasonable to proceed with MRI for injection planning.  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  Plan for MRI followed by likely facet joint injections.  Likely will need premedication for shellfish allergy prior to injections and will need to stop aspirin and Plavix for over a week prior to these injections.  Recheck after MRI.  Ativan for MRI anxiety.  Caution against driving.   Restless leg syndrome: Doing quite well with very low-dose Requip.  Prescribe 0.25 mg pills to  take 1 or 2 at bedtime.  New prescription sent 90-day supply with 3 refills.   PDMP not reviewed this encounter. Orders Placed This Encounter  Procedures  . MR Lumbar Spine Wo Contrast    Stents. Has MRI card    Standing Status:   Future    Standing Expiration Date:   03/21/2021    Order Specific Question:   ** REASON FOR EXAM (FREE TEXT)    Answer:   eval chronic low back pain    Order Specific Question:   What is the patient's sedation requirement?    Answer:   Anti-anxiety    Order Specific Question:   Does the patient have a pacemaker or implanted devices?    Answer:   No    Order Specific Question:   Preferred imaging location?    Answer:   [facility] (table limit-550lbs)    Order Specific Question:   Radiology Contrast Protocol - do NOT remove file path    Answer:   [email or web address]   Meds ordered this encounter  Medications  . LORazepam (ATIVAN) 0.5 MG tablet    Sig: 1-2 tabs 30 - 60 min prior to MRI. Do not drive with this medicine.    Dispense:  4 tablet    Refill:  0  . rOPINIRole (REQUIP) 0.25 MG tablet    Sig: Take 1-2 tablets (0.25-0.5 mg total) by mouth at bedtime.    Dispense:  180 tablet    Refill:  3     Discussed warning signs or symptoms. Please see discharge instructions. Patient expresses understanding.   The above documentation has been reviewed and is accurate and complete Lynne Leader

## 2020-01-23 ENCOUNTER — Encounter: Payer: Self-pay | Admitting: Family Medicine

## 2020-02-08 ENCOUNTER — Other Ambulatory Visit: Payer: Self-pay

## 2020-02-08 ENCOUNTER — Ambulatory Visit (INDEPENDENT_AMBULATORY_CARE_PROVIDER_SITE_OTHER): Payer: Medicare Other

## 2020-02-08 DIAGNOSIS — M545 Low back pain: Secondary | ICD-10-CM | POA: Diagnosis not present

## 2020-02-08 DIAGNOSIS — G8929 Other chronic pain: Secondary | ICD-10-CM

## 2020-02-10 ENCOUNTER — Other Ambulatory Visit: Payer: Self-pay | Admitting: Family Medicine

## 2020-02-10 NOTE — Progress Notes (Signed)
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  We will discuss this in more detail at follow-up on the 18th.

## 2020-02-11 ENCOUNTER — Encounter: Payer: Self-pay | Admitting: Family Medicine

## 2020-02-11 ENCOUNTER — Other Ambulatory Visit: Payer: Self-pay

## 2020-02-11 ENCOUNTER — Ambulatory Visit (INDEPENDENT_AMBULATORY_CARE_PROVIDER_SITE_OTHER): Payer: Medicare Other | Admitting: Family Medicine

## 2020-02-11 VITALS — BP 112/78 | HR 89 | Ht 68.0 in | Wt 231.4 lb

## 2020-02-11 DIAGNOSIS — M545 Low back pain, unspecified: Secondary | ICD-10-CM

## 2020-02-11 DIAGNOSIS — G8929 Other chronic pain: Secondary | ICD-10-CM | POA: Diagnosis not present

## 2020-02-11 NOTE — Patient Instructions (Signed)
Thank you for coming in today. Continue home exercise and water exercise.  I can order at any time aquatic PT in [place].  Back injections may help but will some risk being off aspirin and Plavix.  [place] for me to order the injections if you want. Let me know.  Continue tylenol.  Recheck in about 1 month.   Facet Blocks Patient Information  Description: The facets are joints in the spine between the vertebrae.  Like any joints in the body, facets can become irritated and painful.  Arthritis can also effect the facets.  By injecting steroids and local anesthetic in and around these joints, we can temporarily block the nerve supply to them.  Steroids act directly on irritated nerves and tissues to reduce selling and inflammation which often leads to decreased pain.  Facet blocks may be done anywhere along the spine from the neck to the low back depending upon the location of your pain.   After numbing the skin with local anesthetic (like Novocaine), a small needle is passed onto the facet joints under x-ray guidance.  You may experience a sensation of pressure while this is being done.  The entire block usually lasts about 15-25 minutes.   Conditions which may be treated by facet blocks:   Low back/buttock pain  Neck/shoulder pain  Certain types of headaches  Preparation for the injection:  1. Do not eat any solid food or dairy products within 8 hours of your appointment. 2. You may drink clear liquid up to 3 hours before appointment.  Clear liquids include water, black coffee, juice or soda.  No milk or cream please. 3. You may take your regular medication, including pain medications, with a sip of water before your appointment.  Diabetics should hold regular insulin (if taken separately) and take 1/2 normal NPH dose the morning of the procedure.  Carry some sugar containing items with you to your appointment. 4. A driver must accompany you and be prepared to drive you home after your  procedure. 5. Bring all your current medications with you. 6. An IV may be inserted and sedation may be given at the discretion of the physician. 7. A blood pressure cuff, EKG and other monitors will often be applied during the procedure.  Some patients may need to have extra oxygen administered for a short period. 8. You will be asked to provide medical information, including your allergies and medications, prior to the procedure.  We must know immediately if you are taking blood thinners (like Coumadin/Warfarin) or if you are allergic to IV iodine contrast (dye).  We must know if you could possible be pregnant.  Possible side-effects:   Bleeding from needle site  Infection (rare, may require surgery)  Nerve injury (rare)  Numbness & tingling (temporary)  Difficulty urinating (rare, temporary)  Spinal headache (a headache worse with upright posture)  Light-headedness (temporary)  Pain at injection site (serveral days)  Decreased blood pressure (rare, temporary)  Weakness in arm/leg (temporary)  Pressure sensation in back/neck (temporary)   Call if you experience:   Fever/chills associated with headache or increased back/neck pain  Headache worsened by an upright position  New onset, weakness or numbness of an extremity below the injection site  Hives or difficulty breathing (go to the emergency room)  Inflammation or drainage at the injection site(s)  Severe back/neck pain greater than usual  New symptoms which are concerning to you  Please note:  Although the local anesthetic injected can often make your  back or neck feel good for several hours after the injection, the pain will likely return. It takes 3-7 days for steroids to work.  You may not notice any pain relief for at least one week.  If effective, we will often do a series of 2-3 injections spaced 3-6 weeks apart to maximally decrease your pain.  After the initial series, you may be a candidate for a  more permanent nerve block of the facets.

## 2020-02-11 NOTE — Progress Notes (Signed)
I, Wendy Poet, LAT, ATC, am serving as scribe for Dr. Lynne Leader.  Amanda Fry is a 69 y.o. female who presents to [place] at [facility] today for f/u of low back pain and radiating pain into B hips.  She is also here to review her L-spine MRI that she had on 02/08/20.  She has completed 12 PT sessions and has been taking a combination of Tylenol, turmeric and Requip.  Since her last visit, pt reports that her lower back has been feeling somewhat better over the past few days.  She has also started water aerobics at [facility] and feels this is helping.  Overall she thinks her pain is doing reasonably well.  Diagnostic imaging: L-spine MRI- 02/08/20; L-spine XR- 10/21/19   Pertinent review of systems: No fevers or chills  Relevant historical information: Carotid artery stent on aspirin and Plavix.  Seafood allergy often gets premedication prior to procedures requiring contrast.   Exam:  BP 112/78 (BP Location: Right Arm, Patient Position: Sitting, Cuff Size: Large)   Pulse 89   Ht 5\' 8"  (1.727 m)   Wt 231 lb 6.4 oz (105 kg)   SpO2 96%   BMI 35.18 kg/m  General: Well Developed, well nourished, and in no acute distress.   MSK:  L-spine normal-appearing nontender midline.  Decreased lumbar motion.    Lab and Radiology Results No results found for this or any previous visit (from the past 72 hour(s)). MR Lumbar Spine Wo Contrast  Result Date: 02/09/2020 CLINICAL DATA:  Chronic low back pain radiating down both legs to the knees for 6 months. EXAM: MRI LUMBAR SPINE WITHOUT CONTRAST TECHNIQUE: Multiplanar, multisequence MR imaging of the lumbar spine was performed. No intravenous contrast was administered. COMPARISON:  Lumbar spine radiographs 10/21/2019. Thoracic spine radiographs 01/31/2012. CT abdomen and pelvis 01/26/2012. FINDINGS: Segmentation: There is transitional spinal anatomy. The comparison studies demonstrate 12 full sized pairs of ribs and 6  lumbar type vertebrae, the lowest of which will be considered a fully lumbarized S1. There are vestigial ribs at L1. Alignment:  Normal. Vertebrae: No fracture or suspicious marrow lesion. Mild degenerative endplate edema in the lower lumbar spine, greatest at L4-5. Inferior endplate Schmorl's nodes at L3 and L4. Conus medullaris and cauda equina: Conus extends to the L1-2 level. Conus and cauda equina appear normal. Paraspinal and other soft tissues: Renal cysts including a partially visualized large left renal cyst anteriorly, also present in 2013. Disc levels: Disc desiccation throughout the lumbar spine with mild-to-moderate disc space narrowing from L3-4 to L5-S1. L1-2: Negative. L2-3: Shallow left subarticular disc protrusion and mild facet arthrosis without stenosis. L3-4: Mild circumferential disc bulging, a small left foraminal disc protrusion, and mild-to-moderate facet hypertrophy without stenosis. L4-5: Circumferential disc bulging and moderate facet hypertrophy without stenosis. L5-S1: Circumferential disc bulging and mild-to-moderate facet hypertrophy result in borderline bilateral neural foraminal stenosis without spinal stenosis. S1-2: Minimal leftward disc bulging and mild facet hypertrophy without stenosis. IMPRESSION: 1. Transitional lumbosacral anatomy as above. 2. Moderate multilevel disc and facet degeneration without evidence of neural impingement. Electronically Signed   By: Logan Bores M.D.   On: 02/09/2020 16:17  I, Lynne Leader, personally (independently) visualized and performed the interpretation of the images attached in this note.      Assessment and Plan: 69 y.o. female with low back pain chronic failed physical therapy.  Obtained MRI for potential facet injection planning.  She does have some potential targets for facet injection however  she does not have 1 or 2 really bad facet joints that are an obvious target.  This target is probably bilateral L5-S1.  The lumbarilization  of her S1 vertebrae could also be a possible cause of her pain.  Smalls nodes obviously could be a cause of pain as well but less likely.  However fortunately she had pain improvement with water aerobics.  After discussing the potential benefits of injection and her risks which are mostly due to having to stop aspirin and Plavix for about 10 days for the injections she decides that her pain is not bad enough to proceed with injection at this point.  I think this is pretty reasonable.  Would recommend continuing water aerobics and potentially consider aquatic physical therapy in the future.  Happy to place referral if she would like me to.     Total encounter time 30 minutes including charting time date of service. As above MRI results and potential next steps.  Discussed warning signs or symptoms. Please see discharge instructions. Patient expresses understanding.   The above documentation has been reviewed and is accurate and complete Lynne Leader, M.D.

## 2020-02-20 ENCOUNTER — Telehealth: Payer: Self-pay | Admitting: Physician Assistant

## 2020-02-20 NOTE — Telephone Encounter (Signed)
Refill of Plavix 75 mg tablet - 1 tab PO QD, #30, 1 refill faxed today by this Probation officer to [facility] (store [phone]) [address], [place], [place]  Phone: [phone] Fax: [phone]  Candiss Norse, PA-C

## 2020-02-22 ENCOUNTER — Other Ambulatory Visit: Payer: Medicare Other

## 2020-03-09 DIAGNOSIS — I10 Essential (primary) hypertension: Secondary | ICD-10-CM | POA: Diagnosis not present

## 2020-03-09 DIAGNOSIS — E782 Mixed hyperlipidemia: Secondary | ICD-10-CM | POA: Diagnosis not present

## 2020-03-09 DIAGNOSIS — G4700 Insomnia, unspecified: Secondary | ICD-10-CM | POA: Diagnosis not present

## 2020-03-09 DIAGNOSIS — E039 Hypothyroidism, unspecified: Secondary | ICD-10-CM | POA: Diagnosis not present

## 2020-03-09 DIAGNOSIS — G4733 Obstructive sleep apnea (adult) (pediatric): Secondary | ICD-10-CM | POA: Diagnosis not present

## 2020-03-19 ENCOUNTER — Ambulatory Visit: Payer: Medicare Other | Admitting: Family Medicine

## 2020-03-27 DIAGNOSIS — M25531 Pain in right wrist: Secondary | ICD-10-CM | POA: Insufficient documentation

## 2020-03-27 DIAGNOSIS — M654 Radial styloid tenosynovitis [de Quervain]: Secondary | ICD-10-CM | POA: Insufficient documentation

## 2020-04-13 ENCOUNTER — Telehealth: Payer: Self-pay | Admitting: Student

## 2020-04-13 NOTE — Telephone Encounter (Signed)
NIR.  Received request from patient for Plavix refill. [place] [phone]) at 1050 and left VM to fill prescription- Plavix 75 mg tablets, take one tablet by mouth once daily, dispense 30 tablets with 3 refills.   Bea Graff Kerstie Agent, PA-C 04/13/2020, 10:53 AM

## 2020-05-07 DIAGNOSIS — M25531 Pain in right wrist: Secondary | ICD-10-CM | POA: Diagnosis not present

## 2020-05-07 DIAGNOSIS — M654 Radial styloid tenosynovitis [de Quervain]: Secondary | ICD-10-CM | POA: Diagnosis not present

## 2020-05-19 NOTE — Progress Notes (Signed)
Assessment/Plan:    1.  Essential Tremor  -no c/o  -Continue Inderal LA, 80 mg daily  2.  History of ACA aneurysm  -Follows with Dr. Estanislado Pandy  3.  Restless Leg Syndrome -We discussed the nature and pathophysiology as well as possible causes. Pt states that she has had tsh by pcp done.  We discussed the phenomenon of augmentation, seen commonly with the dopamine agonists.  -Pt has low ferritin/iron deficiency which may be contributing to RLS symptoms  -we will add ferrous sulfate 325 mg daily x 3 months.  Discussed not to take with milk.  Discussed that this can cause constipation and to drink plenty of water.    -take vitamin C, 100-200 mg with each ferrous sulfate dose, or take the ferrous sulfate with small glass of orange juice (if not diabetic)    3.  Migraine headache  -discussed that I would like to avoid imitrex given the aneursym hx and age.     -Samples given for Nurtec, 75 mg ODT.  Told her that she can take 1 at the onset of headache.  She should not use more than 1 in 24 hours.  R/B/SE were discussed.  The opportunity to ask questions was given and they were answered to the best of my ability.  The patient expressed understanding and willingness to follow the outlined treatment protocols.  She was given #8 samples as MCR unlikely to pay.  Pt appreciative   4.  F/u 1 year  Subjective:   Amanda Fry was seen today in follow up for essential tremor.  My previous records were reviewed prior to todays visit.  I have not seen the patient in over 2 years.  At that point in time, she was on Inderal LA, 80 mg daily.  Pt denies falls.  Pt denies lightheadedness, near syncope.  No hallucinations.  Mood has been good.  She has a history of ACA aneurysm.  She follows with Dr. Estanislado Pandy.  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  She has also had her carotid  stented on the L since last visit.    She states that she comes today to discuss HA.  She rarely uses imitrex and has a RX from 2017.  Its a L sided HA and starts in the L temple and then will locate around the L eye and nose.  She describes it as stabbing and pounding.  Recently, 2 weeks ago, she did have a R sided headache (she used to have them on the R side).  She will sometimes have n/v with headaches with photophobia/phonophobia.  She has actually been cutting the 100 mg in half.     She follows with primary care and is now on Requip for restless legs.  Patient states that it is not "real bad."  She will noticed that when she sits down she has developed the legs.  The Requip has helped some.    PREVIOUS MEDICATIONS: Topiramate, 100 mg (equilibrium change, cognitive change -lower dosages did help facial pain)  ALLERGIES:   Allergies  Allergen Reactions  . Clams [Shellfish Allergy] Swelling    SWELLING REACTION UNSPECIFIED   . Augmentin [Amoxicillin-Pot Clavulanate] Nausea And Vomiting    Did it involve swelling of the face/tongue/throat, SOB, or low BP? No Did it involve sudden or severe rash/hives, skin peeling, or any reaction on  the inside of your mouth or nose? No Did you need to seek medical attention at a hospital or doctor's office? No When did it last happen?2-3 years If all above answers are "NO", may proceed with cephalosporin use.    . Biaxin [Clarithromycin] Nausea And Vomiting    CURRENT MEDICATIONS:  Outpatient Encounter Medications as of 05/21/2020  Medication Sig  . aspirin EC 325 MG tablet Take 325 mg by mouth at bedtime.  Marland Kitchen atorvastatin (LIPITOR) 40 MG tablet Take 1 tablet (40 mg total) by mouth daily. (Patient taking differently: Take 40 mg by mouth every evening. )  . AYR SALINE NASAL NA Place 1 spray into the nose daily as needed (congestion).  . Calcium Carb-Cholecalciferol (CALCIUM 600+D) 600-800 MG-UNIT TABS Take 1 tablet by mouth at bedtime.  .  calcium carbonate (TUMS EX) 750 MG chewable tablet Chew 750 mg by mouth daily.  . cetirizine (ZYRTEC) 10 MG tablet Take 10 mg by mouth at bedtime.   . cholecalciferol (VITAMIN D) 1000 units tablet Take 1,000 Units by mouth 2 (two) times daily.  . clopidogrel (PLAVIX) 75 MG tablet Take 75 mg by mouth at bedtime.  . famotidine (PEPCID) 20 MG tablet Take 20 mg by mouth at bedtime.  . Liniments (BLUE-EMU SUPER STRENGTH EX) Apply 1 application topically daily as needed (back pain).  . Magnesium 500 MG CAPS Take 500 mg by mouth at bedtime.   . Melatonin 5 MG CAPS Take 5 mg by mouth at bedtime.  . Menthol, Topical Analgesic, (BIOFREEZE EX) Apply 1 application topically daily as needed (back pain).  Vladimir Faster Glycol-Propyl Glycol (SYSTANE OP) Place 1 drop into both eyes daily as needed (irritation).  . propranolol ER (INDERAL LA) 80 MG 24 hr capsule Take 1 capsule (80 mg total) by mouth daily.  Marland Kitchen rOPINIRole (REQUIP) 0.25 MG tablet Take 1-2 tablets (0.25-0.5 mg total) by mouth at bedtime.  . SUMAtriptan (IMITREX) 100 MG tablet Take 1 tablet (100 mg total) by mouth once. May repeat in 2 hours if headache persists or recurs. Not to exceed 2 per day.  Marland Kitchen SYNTHROID 112 MCG tablet Take 112 mcg by mouth daily.  Marland Kitchen zolpidem (AMBIEN) 5 MG tablet Take 5 mg at bedtime by mouth.  . [DISCONTINUED] acetaminophen (TYLENOL) 500 MG tablet Take 500 mg by mouth every 6 (six) hours as needed for moderate pain or headache.  . [DISCONTINUED] diclofenac Sodium (VOLTAREN) 1 % GEL Apply 1 application topically 4 (four) times daily as needed (pain).  . [DISCONTINUED] LORazepam (ATIVAN) 0.5 MG tablet 1-2 tabs 30 - 60 min prior to MRI. Do not drive with this medicine.  . [DISCONTINUED] vitamin B-12 (CYANOCOBALAMIN) 500 MCG tablet Take 500 mcg by mouth daily.   Facility-Administered Encounter Medications as of 05/21/2020  Medication  . diphenhydrAMINE (BENADRYL) capsule 50 mg     Objective:    PHYSICAL EXAMINATION:     VITALS:   Vitals:   05/21/20 1414  BP: 116/70  Pulse: 82  SpO2: 97%  Weight: 227 lb (103 kg)  Height: 5\' 7"  (1.702 m)    GEN:  The patient appears stated age and is in NAD. HEENT:  Normocephalic, atraumatic.  The mucous membranes are moist. The superficial temporal arteries are without ropiness or tenderness. CV:  RRR Lungs:  CTAB Neck/HEME:  There are no carotid bruits bilaterally.  Neurological examination:  Orientation: The patient is alert and oriented x3. Cranial nerves: There is good facial symmetry. The speech is fluent and clear. Soft palate  rises symmetrically and there is no tongue deviation. Hearing is intact to conversational tone. Sensation: Sensation is intact to light touch throughout Motor: Strength is at least antigravity x4.  Movement examination: Tone: There is normal tone in the UE/LE Abnormal movements: No rest tremor.  No postural tremor.  No intention tremor. Coordination:  There is no decremation with RAM's Gait and Station: The patient has no difficulty arising out of a deep-seated chair without the use of the hands. The patient's stride length is good I have reviewed and interpreted the following labs independently   Chemistry      Component Value Date/Time   NA 140 12/10/2019 0710   K 4.5 12/10/2019 0710   CL 108 12/10/2019 0710   CO2 21 (L) 12/10/2019 0710   BUN 16 12/10/2019 0710   CREATININE 1.10 (H) 12/10/2019 0710      Component Value Date/Time   CALCIUM 9.7 12/10/2019 0710   ALKPHOS 59 10/17/2017 0808   AST 27 10/17/2017 0808   ALT 36 (H) 10/17/2017 0808   BILITOT 0.2 10/17/2017 0808      Lab Results  Component Value Date   WBC 6.2 12/10/2019   HGB 14.5 12/10/2019   HCT 43.6 12/10/2019   MCV 93.2 12/10/2019   PLT 232 12/10/2019   No results found for: TSH   Chemistry      Component Value Date/Time   NA 140 12/10/2019 0710   K 4.5 12/10/2019 0710   CL 108 12/10/2019 0710   CO2 21 (L) 12/10/2019 0710   BUN 16  12/10/2019 0710   CREATININE 1.10 (H) 12/10/2019 0710      Component Value Date/Time   CALCIUM 9.7 12/10/2019 0710   ALKPHOS 59 10/17/2017 0808   AST 27 10/17/2017 0808   ALT 36 (H) 10/17/2017 0808   BILITOT 0.2 10/17/2017 0808      No results found for: TSH Lab Results  Component Value Date   IRON 77 11/21/2019   TIBC 333 11/21/2019   FERRITIN 52 11/21/2019      Total time spent on today's visit was 30 minutes, including both face-to-face time and nonface-to-face time.  Time included that spent on review of records (prior notes available to me/labs/imaging if pertinent), discussing treatment and goals, answering patient's questions and coordinating care.  Cc:  Carol Ada, MD

## 2020-05-21 ENCOUNTER — Ambulatory Visit (INDEPENDENT_AMBULATORY_CARE_PROVIDER_SITE_OTHER): Payer: Medicare Other | Admitting: Neurology

## 2020-05-21 ENCOUNTER — Encounter: Payer: Self-pay | Admitting: Neurology

## 2020-05-21 ENCOUNTER — Other Ambulatory Visit: Payer: Self-pay

## 2020-05-21 VITALS — BP 116/70 | HR 82 | Ht 67.0 in | Wt 227.0 lb

## 2020-05-21 DIAGNOSIS — G2581 Restless legs syndrome: Secondary | ICD-10-CM | POA: Diagnosis not present

## 2020-05-21 DIAGNOSIS — I671 Cerebral aneurysm, nonruptured: Secondary | ICD-10-CM

## 2020-05-21 DIAGNOSIS — G43009 Migraine without aura, not intractable, without status migrainosus: Secondary | ICD-10-CM | POA: Diagnosis not present

## 2020-05-21 NOTE — Patient Instructions (Signed)
1.  For restless leg you can add ferrous sulfate 325 mg daily x 3 months.  Do not to take with milk.  Take vitamin C, 100-200 mg with each ferrous sulfate dose, or take the ferrous sulfate with small glass of orange juice (if not diabetic)  2.  Take nurtec, 1 tablet, with the onset of a headache.  You cannot take more than 1 in 24 hours

## 2020-05-28 ENCOUNTER — Emergency Department (HOSPITAL_BASED_OUTPATIENT_CLINIC_OR_DEPARTMENT_OTHER)
Admission: EM | Admit: 2020-05-28 | Discharge: 2020-05-29 | Disposition: A | Discharge status: Home / Self Care | Payer: Medicare Other | Attending: Emergency Medicine | Admitting: Emergency Medicine

## 2020-05-28 ENCOUNTER — Encounter (HOSPITAL_BASED_OUTPATIENT_CLINIC_OR_DEPARTMENT_OTHER): Payer: Self-pay | Admitting: *Deleted

## 2020-05-28 ENCOUNTER — Other Ambulatory Visit: Payer: Self-pay

## 2020-05-28 ENCOUNTER — Emergency Department (HOSPITAL_BASED_OUTPATIENT_CLINIC_OR_DEPARTMENT_OTHER): Payer: Medicare Other

## 2020-05-28 DIAGNOSIS — Y929 Unspecified place or not applicable: Secondary | ICD-10-CM | POA: Insufficient documentation

## 2020-05-28 DIAGNOSIS — W293XXA Contact with powered garden and outdoor hand tools and machinery, initial encounter: Secondary | ICD-10-CM | POA: Diagnosis not present

## 2020-05-28 DIAGNOSIS — R52 Pain, unspecified: Secondary | ICD-10-CM

## 2020-05-28 DIAGNOSIS — R21 Rash and other nonspecific skin eruption: Secondary | ICD-10-CM | POA: Diagnosis not present

## 2020-05-28 DIAGNOSIS — E039 Hypothyroidism, unspecified: Secondary | ICD-10-CM | POA: Diagnosis not present

## 2020-05-28 DIAGNOSIS — I10 Essential (primary) hypertension: Secondary | ICD-10-CM | POA: Diagnosis not present

## 2020-05-28 DIAGNOSIS — S61215A Laceration without foreign body of left ring finger without damage to nail, initial encounter: Secondary | ICD-10-CM | POA: Insufficient documentation

## 2020-05-28 DIAGNOSIS — Z79899 Other long term (current) drug therapy: Secondary | ICD-10-CM | POA: Diagnosis not present

## 2020-05-28 DIAGNOSIS — Y939 Activity, unspecified: Secondary | ICD-10-CM | POA: Insufficient documentation

## 2020-05-28 DIAGNOSIS — Z7982 Long term (current) use of aspirin: Secondary | ICD-10-CM | POA: Insufficient documentation

## 2020-05-28 DIAGNOSIS — Z87891 Personal history of nicotine dependence: Secondary | ICD-10-CM | POA: Insufficient documentation

## 2020-05-28 DIAGNOSIS — Y999 Unspecified external cause status: Secondary | ICD-10-CM | POA: Diagnosis not present

## 2020-05-28 DIAGNOSIS — S60945A Unspecified superficial injury of left ring finger, initial encounter: Secondary | ICD-10-CM | POA: Diagnosis present

## 2020-05-28 MED ORDER — CEPHALEXIN 250 MG PO CAPS
500.0000 mg | ORAL_CAPSULE | Freq: Once | ORAL | Status: AC
  Administered 2020-05-28: 500 mg via ORAL
  Filled 2020-05-28: qty 2

## 2020-05-28 MED ORDER — ACETAMINOPHEN 500 MG PO TABS
500.0000 mg | ORAL_TABLET | Freq: Once | ORAL | Status: AC
  Administered 2020-05-28: 500 mg via ORAL
  Filled 2020-05-28: qty 1

## 2020-05-28 MED ORDER — CEPHALEXIN 500 MG PO CAPS: 500.0000 mg | ORAL_CAPSULE | Freq: Two times a day (BID) | ORAL | 0 refills | Status: AC

## 2020-05-28 MED ORDER — OXYCODONE-ACETAMINOPHEN 5-325 MG PO TABS
ORAL_TABLET | ORAL | Status: AC
  Administered 2020-05-28: 1 via ORAL
  Filled 2020-05-28: qty 1

## 2020-05-28 MED ORDER — OXYCODONE-ACETAMINOPHEN 5-325 MG PO TABS: 1.0000 | ORAL_TABLET | Freq: Once | ORAL | Status: AC

## 2020-05-28 NOTE — ED Triage Notes (Signed)
Laceration to her left ring finger with the hedge clippers. Bleeding controlled with pressure.

## 2020-05-28 NOTE — ED Provider Notes (Signed)
[facility] Provider Note   CSN: [id number] Arrival date & time: 05/28/20  1627     History Chief Complaint  Patient presents with  . Laceration    [facility] Fry is a 69 y.o. female who presents emergency department after accidentally cutting her left fourth finger with hedge clipper today.  Patient ports that she is on Plavix, did have significant mount of bleeding.  She poured quick clot onto her wound and was able to stop most of the bleeding.  She only has a small point of bleeding near the tip of her finger.  She denies numbness in her finger.  She denies lightheadedness.  She denies any other injuries.  She does see her PCP regularly and reports she think she had a tetanus shot about 5 years ago.  HPI     Past Medical History:  Diagnosis Date  . Allergic rhinitis   . Anterior communicating artery aneurysm   . Anxiety   . Carpal tunnel syndrome   . Cyst of kidney, acquired   . Esophageal reflux   . Family history of adverse reaction to anesthesia    sister N/V with anesthesia  . Fibromyalgia   . GERD (gastroesophageal reflux disease)   . Headache   . HLD (hyperlipidemia)   . Hypertension   . Hypothyroidism   . Insomnia   . Plantar fasciitis    left foot  . PONV (postoperative nausea and vomiting)     Patient Active Problem List   Diagnosis Date Noted  . Restless leg syndrome 11/21/2019  . Brain aneurysm 07/11/2018  . Carotid stenosis, asymptomatic, unspecified laterality 05/15/2018    Past Surgical History:  Procedure Laterality Date  . APPENDECTOMY  2001  . BREAST LUMPECTOMY Left 1994  . CESAREAN SECTION    . IR ANGIO EXTRACRAN SEL COM CAROTID INNOMINATE UNI R MOD SED  07/11/2018  . IR ANGIO INTRA EXTRACRAN SEL COM CAROTID INNOMINATE BILAT MOD SED  05/10/2018  . IR ANGIO INTRA EXTRACRAN SEL COM CAROTID INNOMINATE BILAT MOD SED  12/10/2019  . IR ANGIO INTRA EXTRACRAN SEL INTERNAL CAROTID UNI L MOD SED  07/11/2018  . IR  ANGIO VERTEBRAL SEL SUBCLAVIAN INNOMINATE UNI R MOD SED  07/11/2018  . IR ANGIO VERTEBRAL SEL VERTEBRAL BILAT MOD SED  05/10/2018  . IR ANGIO VERTEBRAL SEL VERTEBRAL UNI L MOD SED  07/11/2018  . IR ANGIO VERTEBRAL SEL VERTEBRAL UNI R MOD SED  12/10/2019  . IR ANGIOGRAM FOLLOW UP STUDY  07/11/2018  . IR INTRAVSC STENT CERV CAROTID W/EMB-PROT MOD SED INCL ANGIO  05/15/2018  . IR NEURO EACH ADD'L AFTER BASIC UNI LEFT (MS)  07/11/2018  . IR TRANSCATH/EMBOLIZ  07/11/2018  . IR US GUIDE VASC ACCESS RIGHT  12/10/2019  . PARTIAL HYSTERECTOMY  1994  . RADIOLOGY WITH ANESTHESIA N/A 05/10/2018   Procedure: EMBOLIZATION;  Surgeon: Luanne Bras, MD;  Location: [facility];  Service: Radiology;  Laterality: N/A;  . RADIOLOGY WITH ANESTHESIA N/A 05/15/2018   Procedure: Angioplasty with stenting;  Surgeon: Luanne Bras, MD;  Location: [facility];  Service: Radiology;  Laterality: N/A;  . RADIOLOGY WITH ANESTHESIA N/A 07/11/2018   Procedure: IR WITH ANESTHESIAANEURYSM/EMBOLIZATION;  Surgeon: Luanne Bras, MD;  Location: [facility];  Service: Radiology;  Laterality: N/A;  . TONSILLECTOMY       OB History   No obstetric history on file.     Family History  Problem Relation Age of Onset  . CAD Father 31  CABG  . Cancer Mother        BREAST  . Restless legs syndrome Mother   . Healthy Brother   . Healthy Brother   . Diabetes Sister   . Cancer Sister        BREAST  . Restless legs syndrome Sister   . Healthy Son   . Healthy Daughter   . Heart disease Maternal Grandmother   . Heart disease Maternal Grandfather   . Emphysema Paternal Grandfather     Social History   Tobacco Use  . Smoking status: Former Smoker    Packs/day: 0.50    Years: 10.00    Pack years: 5.00    Types: Cigarettes    Quit date: 09/26/2009    Years since quitting: 10.6  . Smokeless tobacco: Never Used  Vaping Use  . Vaping Use: Never used  Substance Use Topics  . Alcohol use: Yes    Alcohol/week: 7.0 standard  drinks    Types: 7 Glasses of wine per week    Comment: not since April 30, 2018  . Drug use: No    Home Medications Prior to Admission medications   Medication Sig Start Date End Date Taking? Authorizing Provider  aspirin EC 325 MG tablet Take 325 mg by mouth at bedtime.    [provider]  atorvastatin (LIPITOR) 40 MG tablet Take 1 tablet (40 mg total) by mouth daily. Patient taking differently: Take 40 mg by mouth every evening.  08/15/17 05/21/20  Josue Hector, MD  AYR SALINE NASAL NA Place 1 spray into the nose daily as needed (congestion).    [provider]  Calcium Carb-Cholecalciferol (CALCIUM 600+D) 600-800 MG-UNIT TABS Take 1 tablet by mouth at bedtime.    [provider]  calcium carbonate (TUMS EX) 750 MG chewable tablet Chew 750 mg by mouth daily.    [provider]  cephALEXin (KEFLEX) 500 MG capsule Take 1 capsule (500 mg total) by mouth 2 (two) times daily for 5 days. 05/29/20 06/03/20  Wyvonnia Dusky, MD  cetirizine (ZYRTEC) 10 MG tablet Take 10 mg by mouth at bedtime.     [provider]  cholecalciferol (VITAMIN D) 1000 units tablet Take 1,000 Units by mouth 2 (two) times daily.    [provider]  clopidogrel (PLAVIX) 75 MG tablet Take 75 mg by mouth at bedtime.    [provider]  famotidine (PEPCID) 20 MG tablet Take 20 mg by mouth at bedtime.    [provider]  Liniments (BLUE-EMU SUPER STRENGTH EX) Apply 1 application topically daily as needed (back pain).    [provider]  Magnesium 500 MG CAPS Take 500 mg by mouth at bedtime.     [provider]  Melatonin 5 MG CAPS Take 5 mg by mouth at bedtime.    [provider]  Menthol, Topical Analgesic, (BIOFREEZE EX) Apply 1 application topically daily as needed (back pain).    [provider]  Polyethyl Glycol-Propyl Glycol (SYSTANE OP) Place 1 drop into both eyes daily as needed (irritation).    [provider]  propranolol ER (INDERAL LA) 80 MG 24 hr capsule Take 1 capsule (80 mg total) by mouth daily. 01/24/17   Tat, Eustace Quail, DO  rOPINIRole (REQUIP) 0.25 MG tablet Take 1-2 tablets (0.25-0.5 mg total) by mouth at bedtime. 01/20/20   Gregor Hams, MD  SUMAtriptan (IMITREX) 100 MG tablet Take 1 tablet (100 mg total) by mouth once. May repeat  in 2 hours if headache persists or recurs. Not to exceed 2 per day. 03/16/16   Tat, Eustace Quail, DO  SYNTHROID 112 MCG tablet Take 112 mcg by mouth daily. 01/18/17   [provider]  zolpidem (AMBIEN) 5 MG tablet Take 5 mg at bedtime by mouth.    [provider]    Allergies    Clams [shellfish allergy], Augmentin [amoxicillin-pot clavulanate], and Biaxin [clarithromycin]  Review of Systems   Review of Systems  Constitutional: Negative for chills and fever.  HENT: Negative for ear pain and sore throat.   Eyes: Negative for photophobia and visual disturbance.  Respiratory: Negative for cough and shortness of breath.   Cardiovascular: Negative for chest pain and palpitations.  Gastrointestinal: Negative for abdominal pain and vomiting.  Genitourinary: Negative for dysuria and hematuria.  Musculoskeletal: Positive for arthralgias and myalgias.  Skin: Positive for rash and wound.  Neurological: Negative for syncope, light-headedness and headaches.  All other systems reviewed and are negative.   Physical Exam Updated Vital Signs BP (!) 162/94   Pulse (!) 18   Temp 98.2 F (36.8 C)   Resp 18   Ht 5\' 7"  (1.702 m)   Wt 103 kg   SpO2 100%   BMI 35.56 kg/m   Physical Exam Vitals and nursing note reviewed.  Constitutional:      General: She is not in acute distress.    Appearance: She is well-developed.  HENT:     Head: Normocephalic and atraumatic.  Eyes:     Conjunctiva/sclera: Conjunctivae normal.  Cardiovascular:     Rate and Rhythm: Normal rate and regular rhythm.     Pulses: Normal pulses.  Pulmonary:      Effort: Pulmonary effort is normal. No respiratory distress.     Breath sounds: Normal breath sounds.  Abdominal:     General: There is no distension.     Palpations: Abdomen is soft.     Tenderness: There is no abdominal tenderness.  Musculoskeletal:     Cervical back: Neck supple.  Skin:    General: Skin is warm and dry.     Comments: Laceration to left 4th finger  Neurological:     General: No focal deficit present.     Mental Status: She is alert and oriented to person, place, and time.  Psychiatric:        Mood and Affect: Mood normal.        Behavior: Behavior normal.     ED Results / Procedures / Treatments   Labs (all labs ordered are listed, but only abnormal results are displayed) Labs Reviewed - No data to display  EKG None  Radiology DG Finger Ring Left  Result Date: 05/28/2020 CLINICAL DATA:  Left ring finger laceration EXAM: LEFT RING FINGER 2+V COMPARISON:  None. FINDINGS: Three view radiograph left ring finger demonstrates normal alignment. No fracture or dislocation. Joint spaces are preserved. Subcutaneous gas and soft tissue swelling is seen involving the distal pad of the ring finger. No retained radiopaque foreign body. IMPRESSION: 1. No acute osseous injury. 2. Subcutaneous gas and soft tissue swelling involving the distal pad of the ring finger. Electronically Signed   By: Fidela Salisbury MD   On: 05/28/2020 23:23    Procedures .Marland KitchenLaceration Repair  Date/Time: 05/28/2020 11:47 PM Performed by: Wyvonnia Dusky, MD Authorized by: Wyvonnia Dusky, MD   Consent:    Consent obtained:  Verbal   Consent given by:  Patient   Risks discussed:  Infection, pain, poor cosmetic result and poor wound healing   Alternatives discussed:  No treatment Anesthesia (see MAR for exact dosages):    Anesthesia method:  None Laceration details:    Location:  Finger   Finger location:  L ring finger   Length (cm):  1   Depth (mm):  3 Repair type:    Repair type:   Simple Pre-procedure details:    Preparation:  Patient was prepped and draped in usual sterile fashion Exploration:    Hemostasis achieved with:  Direct pressure   Wound exploration: wound explored through full range of motion and entire depth of wound probed and visualized     Contaminated: no   Treatment:    Area cleansed with:  Saline   Amount of cleaning:  Standard Skin repair:    Repair method:  Sutures   Suture size:  4-0   Suture material:  Prolene   Suture technique:  Simple interrupted   Number of sutures:  1 Approximation:    Approximation:  Close Post-procedure details:    Dressing:  Non-adherent dressing   Patient tolerance of procedure:  Tolerated well, no immediate complications   (including critical care time)  Medications Ordered in ED Medications  oxyCODONE-acetaminophen (PERCOCET/ROXICET) 5-325 MG per tablet 1 tablet (has no administration in time range)  oxyCODONE-acetaminophen (PERCOCET/ROXICET) 5-325 MG per tablet (has no administration in time range)  acetaminophen (TYLENOL) tablet 500 mg (500 mg Oral Given 05/28/20 1715)  cephALEXin (KEFLEX) capsule 500 mg (500 mg Oral Given 05/28/20 2252)    ED Course  I have reviewed the triage vital signs and the nursing notes.  Pertinent labs & imaging results that were available during my care of the patient were reviewed by me and considered in my medical decision making (see chart for details).  31-year female presented emerge department laceration to her left ring finger which was accidental while trimming a hedge brush today.  She achieved most of the hemostasis at home by applying quick clot agent to her finger.  The majority of the laceration appears to be sealed by the quick clot solution.  She has a small focus of bleeding from the distal tip of her finger.  I placed a single 4-0 prolene suture through this site and stopped the bleeding.  We covered the wound with quick clot bandage, taped securely.  I instructed  her to keep it dry for 48 hours, then she can take down the dressings.  Advised PCP recheck of wound in 5 days.  I will also prescribe keflex for 5 days given dirty nature of wound, to prevent infection.  Her tetanus is up to date.   No bony fracture on xray imaging noted.  Clinical Course as of May 28 2349  Thu May 28, 2020  2337 Single suture placed over bleeding tip at end of finger.  Wound otherwise cleaned down around quick-clot agent.  Pt given keflex, will d/c on 5 days keflex, instrucitons to f/u with PCP in 5 days for wound recheck and suture removal.   [MT]    Clinical Course User Index [MT] Langston Masker Carola Rhine, MD    Final Clinical Impression(s) / ED Diagnoses Final diagnoses:  Laceration of left ring finger without foreign body without damage to nail, initial encounter    Rx / DC Orders ED Discharge Orders         Ordered    cephALEXin (KEFLEX) 500 MG capsule  2 times daily  05/28/20 2349           Wyvonnia Dusky, MD 05/28/20 2350

## 2020-05-28 NOTE — Discharge Instructions (Signed)
Keep your wound and finger dry for the next 48 hours.  Afterwards you can take down your dressings and wash your hands normally.  You will have a scar where you applied the quick clot.  This can take a few days to dissolve.  If you start bleeding again, hold pressure directly over the bleeding site for 10 minutes.  If it is still bleeding, apply more quick clot that was given to you.

## 2020-06-04 DIAGNOSIS — Z4802 Encounter for removal of sutures: Secondary | ICD-10-CM | POA: Diagnosis not present

## 2020-06-04 DIAGNOSIS — Z23 Encounter for immunization: Secondary | ICD-10-CM | POA: Diagnosis not present

## 2020-06-10 ENCOUNTER — Other Ambulatory Visit (HOSPITAL_COMMUNITY): Payer: Self-pay | Admitting: Interventional Radiology

## 2020-06-10 ENCOUNTER — Telehealth (HOSPITAL_COMMUNITY): Payer: Self-pay

## 2020-06-10 DIAGNOSIS — I6522 Occlusion and stenosis of left carotid artery: Secondary | ICD-10-CM

## 2020-06-10 NOTE — Telephone Encounter (Signed)
Called to schedule us carotid, no answer, left vm. AW  

## 2020-06-16 ENCOUNTER — Other Ambulatory Visit: Payer: Self-pay

## 2020-06-16 ENCOUNTER — Ambulatory Visit (HOSPITAL_COMMUNITY)
Admission: RE | Admit: 2020-06-16 | Discharge: 2020-06-16 | Disposition: A | Discharge status: Home / Self Care | Payer: Medicare Other | Source: Ambulatory Visit | Attending: Interventional Radiology | Admitting: Interventional Radiology

## 2020-06-16 DIAGNOSIS — I6522 Occlusion and stenosis of left carotid artery: Secondary | ICD-10-CM | POA: Diagnosis not present

## 2020-06-16 NOTE — Progress Notes (Signed)
Carotid artery duplex has been completed. Preliminary results can be found in CV Proc through chart review.   06/16/20 10:21 AM [facility] RVT

## 2020-06-17 ENCOUNTER — Telehealth (HOSPITAL_COMMUNITY): Payer: Self-pay

## 2020-06-17 NOTE — Telephone Encounter (Signed)
Pt agreed to f/u in 6 months with us carotid. AW 

## 2020-09-11 ENCOUNTER — Ambulatory Visit: Payer: Medicare Other | Attending: Internal Medicine

## 2020-09-11 DIAGNOSIS — Z23 Encounter for immunization: Secondary | ICD-10-CM

## 2020-09-11 NOTE — Progress Notes (Signed)
   Covid-19 Vaccination Clinic  Name:  [facility]    MRN: [id number] DOB: 10-Apr-1951  09/11/2020  [name] was observed post Covid-19 immunization for 15 minutes without incident. She was provided with Vaccine Information Sheet and instruction to access the V-Safe system.   [name] was instructed to call 911 with any severe reactions post vaccine: Marland Kitchen Difficulty breathing  . Swelling of face and throat  . A fast heartbeat  . A bad rash all over body  . Dizziness and weakness   Immunizations Administered    Name Date Dose VIS Date Route   Pfizer COVID-19 Vaccine 09/11/2020  1:25 PM 0.3 mL 07/15/2020 Intramuscular   Manufacturer: [place]   Lot: [id number]   [place]: [address]-5

## 2020-09-30 DIAGNOSIS — M654 Radial styloid tenosynovitis [de Quervain]: Secondary | ICD-10-CM | POA: Diagnosis not present

## 2020-09-30 DIAGNOSIS — M25531 Pain in right wrist: Secondary | ICD-10-CM | POA: Diagnosis not present

## 2020-10-20 ENCOUNTER — Other Ambulatory Visit: Payer: Self-pay | Admitting: Family Medicine

## 2020-10-21 NOTE — Telephone Encounter (Signed)
Please advise 

## 2020-10-28 DIAGNOSIS — M1811 Unilateral primary osteoarthritis of first carpometacarpal joint, right hand: Secondary | ICD-10-CM | POA: Insufficient documentation

## 2020-10-28 DIAGNOSIS — M654 Radial styloid tenosynovitis [de Quervain]: Secondary | ICD-10-CM | POA: Diagnosis not present

## 2020-10-28 DIAGNOSIS — G5601 Carpal tunnel syndrome, right upper limb: Secondary | ICD-10-CM | POA: Insufficient documentation

## 2020-11-18 ENCOUNTER — Other Ambulatory Visit (HOSPITAL_COMMUNITY): Payer: Self-pay | Admitting: Student

## 2020-11-18 MED ORDER — CLOPIDOGREL BISULFATE 75 MG PO TABS: 75.0000 mg | ORAL_TABLET | Freq: Every day | ORAL | 3 refills | Status: DC

## 2020-12-15 ENCOUNTER — Other Ambulatory Visit (HOSPITAL_COMMUNITY): Payer: Self-pay | Admitting: Interventional Radiology

## 2020-12-15 DIAGNOSIS — I671 Cerebral aneurysm, nonruptured: Secondary | ICD-10-CM

## 2020-12-15 DIAGNOSIS — I6522 Occlusion and stenosis of left carotid artery: Secondary | ICD-10-CM

## 2020-12-17 DIAGNOSIS — G4733 Obstructive sleep apnea (adult) (pediatric): Secondary | ICD-10-CM | POA: Diagnosis not present

## 2020-12-29 ENCOUNTER — Other Ambulatory Visit: Payer: Self-pay

## 2020-12-29 ENCOUNTER — Ambulatory Visit (HOSPITAL_BASED_OUTPATIENT_CLINIC_OR_DEPARTMENT_OTHER)
Admission: RE | Admit: 2020-12-29 | Discharge: 2020-12-29 | Disposition: A | Discharge status: Home / Self Care | Payer: Medicare Other | Source: Ambulatory Visit | Attending: Interventional Radiology | Admitting: Interventional Radiology

## 2020-12-29 ENCOUNTER — Ambulatory Visit (HOSPITAL_COMMUNITY)
Admission: RE | Admit: 2020-12-29 | Discharge: 2020-12-29 | Disposition: A | Discharge status: Home / Self Care | Payer: Medicare Other | Source: Ambulatory Visit | Attending: Interventional Radiology | Admitting: Interventional Radiology

## 2020-12-29 DIAGNOSIS — I6522 Occlusion and stenosis of left carotid artery: Secondary | ICD-10-CM | POA: Insufficient documentation

## 2020-12-29 DIAGNOSIS — I671 Cerebral aneurysm, nonruptured: Secondary | ICD-10-CM | POA: Insufficient documentation

## 2020-12-30 ENCOUNTER — Telehealth (HOSPITAL_COMMUNITY): Payer: Self-pay

## 2020-12-30 NOTE — Telephone Encounter (Signed)
Pt agreed to f/u in 6 months with US carotid and mra head wo. AW

## 2021-02-02 DIAGNOSIS — M25531 Pain in right wrist: Secondary | ICD-10-CM | POA: Diagnosis not present

## 2021-02-02 DIAGNOSIS — G5601 Carpal tunnel syndrome, right upper limb: Secondary | ICD-10-CM | POA: Diagnosis not present

## 2021-02-02 DIAGNOSIS — M654 Radial styloid tenosynovitis [de Quervain]: Secondary | ICD-10-CM | POA: Diagnosis not present

## 2021-02-02 DIAGNOSIS — M1811 Unilateral primary osteoarthritis of first carpometacarpal joint, right hand: Secondary | ICD-10-CM | POA: Diagnosis not present

## 2021-02-15 DIAGNOSIS — G5601 Carpal tunnel syndrome, right upper limb: Secondary | ICD-10-CM | POA: Diagnosis not present

## 2021-02-15 DIAGNOSIS — M654 Radial styloid tenosynovitis [de Quervain]: Secondary | ICD-10-CM | POA: Diagnosis not present

## 2021-03-02 DIAGNOSIS — M25631 Stiffness of right wrist, not elsewhere classified: Secondary | ICD-10-CM | POA: Diagnosis not present

## 2021-03-08 DIAGNOSIS — Z1211 Encounter for screening for malignant neoplasm of colon: Secondary | ICD-10-CM | POA: Diagnosis not present

## 2021-03-08 DIAGNOSIS — G4733 Obstructive sleep apnea (adult) (pediatric): Secondary | ICD-10-CM | POA: Diagnosis not present

## 2021-03-08 DIAGNOSIS — Z0000 Encounter for general adult medical examination without abnormal findings: Secondary | ICD-10-CM | POA: Diagnosis not present

## 2021-03-08 DIAGNOSIS — E039 Hypothyroidism, unspecified: Secondary | ICD-10-CM | POA: Diagnosis not present

## 2021-03-08 DIAGNOSIS — K219 Gastro-esophageal reflux disease without esophagitis: Secondary | ICD-10-CM | POA: Diagnosis not present

## 2021-03-08 DIAGNOSIS — G4700 Insomnia, unspecified: Secondary | ICD-10-CM | POA: Diagnosis not present

## 2021-03-08 DIAGNOSIS — E669 Obesity, unspecified: Secondary | ICD-10-CM | POA: Diagnosis not present

## 2021-03-08 DIAGNOSIS — Z1389 Encounter for screening for other disorder: Secondary | ICD-10-CM | POA: Diagnosis not present

## 2021-03-08 DIAGNOSIS — I10 Essential (primary) hypertension: Secondary | ICD-10-CM | POA: Diagnosis not present

## 2021-03-08 DIAGNOSIS — E782 Mixed hyperlipidemia: Secondary | ICD-10-CM | POA: Diagnosis not present

## 2021-03-12 DIAGNOSIS — M25631 Stiffness of right wrist, not elsewhere classified: Secondary | ICD-10-CM | POA: Diagnosis not present

## 2021-03-16 ENCOUNTER — Other Ambulatory Visit (HOSPITAL_COMMUNITY): Payer: Self-pay | Admitting: Student

## 2021-03-16 MED ORDER — CLOPIDOGREL BISULFATE 75 MG PO TABS: 75.0000 mg | ORAL_TABLET | Freq: Every day | ORAL | 6 refills | Status: DC

## 2021-03-19 DIAGNOSIS — M25631 Stiffness of right wrist, not elsewhere classified: Secondary | ICD-10-CM | POA: Diagnosis not present

## 2021-03-26 DIAGNOSIS — M25631 Stiffness of right wrist, not elsewhere classified: Secondary | ICD-10-CM | POA: Diagnosis not present

## 2021-04-02 DIAGNOSIS — M25631 Stiffness of right wrist, not elsewhere classified: Secondary | ICD-10-CM | POA: Diagnosis not present

## 2021-04-06 DIAGNOSIS — M25631 Stiffness of right wrist, not elsewhere classified: Secondary | ICD-10-CM | POA: Diagnosis not present

## 2021-04-15 DIAGNOSIS — M25631 Stiffness of right wrist, not elsewhere classified: Secondary | ICD-10-CM | POA: Diagnosis not present

## 2021-04-20 DIAGNOSIS — M25631 Stiffness of right wrist, not elsewhere classified: Secondary | ICD-10-CM | POA: Diagnosis not present

## 2021-05-04 DIAGNOSIS — M25631 Stiffness of right wrist, not elsewhere classified: Secondary | ICD-10-CM | POA: Diagnosis not present

## 2021-05-04 DIAGNOSIS — R29898 Other symptoms and signs involving the musculoskeletal system: Secondary | ICD-10-CM | POA: Diagnosis not present

## 2021-05-06 DIAGNOSIS — Z20822 Contact with and (suspected) exposure to covid-19: Secondary | ICD-10-CM | POA: Diagnosis not present

## 2021-05-17 DIAGNOSIS — Z8679 Personal history of other diseases of the circulatory system: Secondary | ICD-10-CM | POA: Diagnosis not present

## 2021-05-17 DIAGNOSIS — Z86010 Personal history of colonic polyps: Secondary | ICD-10-CM | POA: Diagnosis not present

## 2021-05-17 DIAGNOSIS — K219 Gastro-esophageal reflux disease without esophagitis: Secondary | ICD-10-CM | POA: Diagnosis not present

## 2021-05-19 NOTE — Progress Notes (Signed)
Assessment/Plan:    1.  Essential Tremor  -no c/o  -Continue Inderal LA, 80 mg daily  2.  History of ACA aneurysm  -Follows with Dr. Estanislado Pandy  3.  Restless Leg Syndrome -Primary care treating with ropinirole.  -Pt has low ferritin/iron deficiency which may be contributing to RLS symptoms     3.  Migraine headache    -RX given for nurtec  4.  F/u prn  Subjective:   [facility] Fry was seen today in follow up for essential tremor.  My previous records were reviewed prior to todays visit.  I have not seen the patient in over 2 years.  Doing well in terms of tremor with Inderal LA, 80 mg daily.  No syncope/near syncope.  Last visit, she was complaining about headache.  We gave her samples of Nurtec for as needed use since Medicare is unlikely to pay for that.  Reports that she doesn't use medicare for her meds so thinks that the [place] would pay for it.  However, she still has 3 of the 8 nurtec samples we gave her.  It does help without making her sleepy but it does take an hour to kick in.  No SE with it.    She follows with primary care and is now on Requip for restless legs.  Last visit, I did add ferrous sulfate, as her ferritin was low.  Told her this could help restless leg.  Reports that if she takes the requip, it is better by the time she gets to bed.  Didn't take the iron.  Worried about constipation  Had Carpal tunnel release in may.  It helped.      PREVIOUS MEDICATIONS: Topiramate, 100 mg (equilibrium change, cognitive change -lower dosages did help facial pain)  ALLERGIES:   Allergies  Allergen Reactions   Clams [Shellfish Allergy] Swelling    SWELLING REACTION UNSPECIFIED    Augmentin [Amoxicillin-Pot Clavulanate] Nausea And Vomiting    Did it involve swelling of the face/tongue/throat, SOB, or low BP? No Did it involve sudden or severe rash/hives, skin peeling, or any reaction on the inside of your mouth or nose? No Did you need to seek medical attention at  a hospital or doctor's office? No When did it last happen?      2-3 years If all above answers are "NO", may proceed with cephalosporin use.     Biaxin [Clarithromycin] Nausea And Vomiting    CURRENT MEDICATIONS:  Outpatient Encounter Medications as of 05/21/2021  Medication Sig   aspirin EC 325 MG tablet Take 325 mg by mouth at bedtime.   atorvastatin (LIPITOR) 40 MG tablet Take 1 tablet (40 mg total) by mouth daily. (Patient taking differently: Take 40 mg by mouth every evening.)   AYR SALINE NASAL NA Place 1 spray into the nose daily as needed (congestion).   Calcium Carb-Cholecalciferol 600-800 MG-UNIT TABS Take 1 tablet by mouth at bedtime.   calcium carbonate (TUMS EX) 750 MG chewable tablet Chew 750 mg by mouth daily.   cetirizine (ZYRTEC) 10 MG tablet Take 10 mg by mouth at bedtime.   cholecalciferol (VITAMIN D) 1000 units tablet Take 1,000 Units by mouth 2 (two) times daily.   clopidogrel (PLAVIX) 75 MG tablet Take 1 tablet (75 mg total) by mouth daily.   famotidine (PEPCID) 20 MG tablet Take 20 mg by mouth at bedtime.   Liniments (BLUE-EMU SUPER STRENGTH EX) Apply 1 application topically daily as needed (back pain).   Magnesium 500  MG CAPS Take 500 mg by mouth at bedtime.    Melatonin 5 MG CAPS Take 5 mg by mouth at bedtime.   Menthol, Topical Analgesic, (BIOFREEZE EX) Apply 1 application topically daily as needed (back pain).   Polyethyl Glycol-Propyl Glycol (SYSTANE OP) Place 1 drop into both eyes daily as needed (irritation).   propranolol ER (INDERAL LA) 80 MG 24 hr capsule Take 1 capsule (80 mg total) by mouth daily.   rOPINIRole (REQUIP) 0.25 MG tablet TAKE 1-2 TABLETS (0.25-0.5 MG TOTAL) BY MOUTH AT BEDTIME.   SYNTHROID 112 MCG tablet Take 112 mcg by mouth daily.   zolpidem (AMBIEN) 5 MG tablet Take 5 mg at bedtime by mouth.   [DISCONTINUED] SUMAtriptan (IMITREX) 100 MG tablet Take 1 tablet (100 mg total) by mouth once. May repeat in 2 hours if headache persists or  recurs. Not to exceed 2 per day.   Facility-Administered Encounter Medications as of 05/21/2021  Medication   diphenhydrAMINE (BENADRYL) capsule 50 mg     Objective:    PHYSICAL EXAMINATION:    VITALS:   Vitals:   05/21/21 1103  BP: (!) 142/86  Pulse: 79  SpO2: 96%  Weight: 234 lb 9.6 oz (106.4 kg)  Height: '5\' 7"'$  (1.702 m)     GEN:  The patient appears stated age and is in NAD. HEENT:  Normocephalic, atraumatic.  The mucous membranes are moist. The superficial temporal arteries are without ropiness or tenderness. CV:  RRR Lungs:  CTAB   Neurological examination:  Orientation: The patient is alert and oriented x3. Cranial nerves: There is good facial symmetry. The speech is fluent and clear. Soft palate rises symmetrically and there is no tongue deviation. Hearing is intact to conversational tone. Sensation: Sensation is intact to light touch throughout Motor: Strength is at least antigravity x4.  Movement examination: Tone: There is normal tone in the UE/LE Abnormal movements: No rest tremor.  No postural tremor.  No intention tremor. Coordination:  There is no decremation with RAM's Gait and Station:  The patient's stride length is good I have reviewed and interpreted the following labs independently   Chemistry      Component Value Date/Time   NA 140 12/10/2019 0710   K 4.5 12/10/2019 0710   CL 108 12/10/2019 0710   CO2 21 (L) 12/10/2019 0710   BUN 16 12/10/2019 0710   CREATININE 1.10 (H) 12/10/2019 0710      Component Value Date/Time   CALCIUM 9.7 12/10/2019 0710   ALKPHOS 59 10/17/2017 0808   AST 27 10/17/2017 0808   ALT 36 (H) 10/17/2017 0808   BILITOT 0.2 10/17/2017 0808      Lab Results  Component Value Date   WBC 6.2 12/10/2019   HGB 14.5 12/10/2019   HCT 43.6 12/10/2019   MCV 93.2 12/10/2019   PLT 232 12/10/2019   No results found for: TSH   Chemistry      Component Value Date/Time   NA 140 12/10/2019 0710   K 4.5 12/10/2019 0710    CL 108 12/10/2019 0710   CO2 21 (L) 12/10/2019 0710   BUN 16 12/10/2019 0710   CREATININE 1.10 (H) 12/10/2019 0710      Component Value Date/Time   CALCIUM 9.7 12/10/2019 0710   ALKPHOS 59 10/17/2017 0808   AST 27 10/17/2017 0808   ALT 36 (H) 10/17/2017 0808   BILITOT 0.2 10/17/2017 0808      No results found for: TSH Lab Results  Component Value Date  IRON 77 11/21/2019   TIBC 333 11/21/2019   FERRITIN 52 11/21/2019       Cc:  Carol Ada, MD

## 2021-05-21 ENCOUNTER — Encounter: Payer: Self-pay | Admitting: Neurology

## 2021-05-21 ENCOUNTER — Ambulatory Visit (INDEPENDENT_AMBULATORY_CARE_PROVIDER_SITE_OTHER): Payer: Medicare Other | Admitting: Neurology

## 2021-05-21 ENCOUNTER — Other Ambulatory Visit: Payer: Self-pay

## 2021-05-21 VITALS — BP 142/86 | HR 79 | Ht 67.0 in | Wt 234.6 lb

## 2021-05-21 DIAGNOSIS — G2581 Restless legs syndrome: Secondary | ICD-10-CM

## 2021-05-21 DIAGNOSIS — G43009 Migraine without aura, not intractable, without status migrainosus: Secondary | ICD-10-CM | POA: Diagnosis not present

## 2021-05-21 DIAGNOSIS — G250 Essential tremor: Secondary | ICD-10-CM | POA: Diagnosis not present

## 2021-05-21 DIAGNOSIS — I6522 Occlusion and stenosis of left carotid artery: Secondary | ICD-10-CM | POA: Diagnosis not present

## 2021-05-21 MED ORDER — NURTEC 75 MG PO TBDP: ORAL_TABLET | ORAL | 1 refills | Status: DC

## 2021-05-24 ENCOUNTER — Other Ambulatory Visit: Payer: Self-pay

## 2021-05-24 DIAGNOSIS — G250 Essential tremor: Secondary | ICD-10-CM

## 2021-05-24 MED ORDER — NURTEC 75 MG PO TBDP: ORAL_TABLET | ORAL | 0 refills | Status: AC

## 2021-05-26 DIAGNOSIS — Z20822 Contact with and (suspected) exposure to covid-19: Secondary | ICD-10-CM | POA: Diagnosis not present

## 2021-05-30 DIAGNOSIS — Z20822 Contact with and (suspected) exposure to covid-19: Secondary | ICD-10-CM | POA: Diagnosis not present

## 2021-06-16 DIAGNOSIS — Z23 Encounter for immunization: Secondary | ICD-10-CM | POA: Diagnosis not present

## 2021-07-04 ENCOUNTER — Encounter (HOSPITAL_BASED_OUTPATIENT_CLINIC_OR_DEPARTMENT_OTHER): Payer: Self-pay | Admitting: Emergency Medicine

## 2021-07-04 ENCOUNTER — Other Ambulatory Visit: Payer: Self-pay

## 2021-07-04 ENCOUNTER — Emergency Department (HOSPITAL_BASED_OUTPATIENT_CLINIC_OR_DEPARTMENT_OTHER): Payer: Medicare Other

## 2021-07-04 ENCOUNTER — Emergency Department (HOSPITAL_BASED_OUTPATIENT_CLINIC_OR_DEPARTMENT_OTHER)
Admission: EM | Admit: 2021-07-04 | Discharge: 2021-07-04 | Disposition: A | Discharge status: Home / Self Care | Payer: Medicare Other | Attending: Emergency Medicine | Admitting: Emergency Medicine

## 2021-07-04 DIAGNOSIS — Z79899 Other long term (current) drug therapy: Secondary | ICD-10-CM | POA: Diagnosis not present

## 2021-07-04 DIAGNOSIS — M25561 Pain in right knee: Secondary | ICD-10-CM | POA: Diagnosis not present

## 2021-07-04 DIAGNOSIS — I10 Essential (primary) hypertension: Secondary | ICD-10-CM | POA: Diagnosis not present

## 2021-07-04 DIAGNOSIS — Z7902 Long term (current) use of antithrombotics/antiplatelets: Secondary | ICD-10-CM | POA: Diagnosis not present

## 2021-07-04 DIAGNOSIS — E039 Hypothyroidism, unspecified: Secondary | ICD-10-CM | POA: Diagnosis not present

## 2021-07-04 DIAGNOSIS — Z87891 Personal history of nicotine dependence: Secondary | ICD-10-CM | POA: Insufficient documentation

## 2021-07-04 DIAGNOSIS — Z7982 Long term (current) use of aspirin: Secondary | ICD-10-CM | POA: Insufficient documentation

## 2021-07-04 DIAGNOSIS — M7989 Other specified soft tissue disorders: Secondary | ICD-10-CM | POA: Diagnosis not present

## 2021-07-04 MED ORDER — DEXAMETHASONE 4 MG PO TABS
12.0000 mg | ORAL_TABLET | Freq: Once | ORAL | Status: AC
  Administered 2021-07-04: 12 mg via ORAL
  Filled 2021-07-04: qty 3

## 2021-07-04 NOTE — ED Triage Notes (Signed)
Pt reports RT knee pain since yesterday; she was on her knees cleaning and felt a sharp pain; since then pain is worse w/ swelling and unable to bend knee

## 2021-07-04 NOTE — ED Provider Notes (Signed)
[place] EMERGENCY DEPARTMENT Provider Note   CSN: [id number] Arrival date & time: 07/04/21  1159     History Chief Complaint  Patient presents with   Knee Pain    Amanda Fry is a 70 y.o. female with no significant past medical history who presents with right knee pain x2 days after she was kneeling on her hands and legs yesterday cleaning the floor.  Patient reports that while she was kneeling on the floor she felt sharp pain, was then able to walk and bend the knee with minimal difficulty, however later that night she had 10 out of 10 sharp pain that was not touched by Voltaren gel and Tylenol.  Patient reports pain is now 5 out of 10, she is having no difficulty walking, some decreased range of motion with bending of the knee.  Patient does take 325 mg aspirin, as well as Plavix for multiple stents in her brain and carotids.  Knee Pain     Past Medical History:  Diagnosis Date   Allergic rhinitis    Anterior communicating artery aneurysm    Anxiety    Carpal tunnel syndrome    Cyst of kidney, acquired    Esophageal reflux    Family history of adverse reaction to anesthesia    sister N/V with anesthesia   Fibromyalgia    GERD (gastroesophageal reflux disease)    Headache    HLD (hyperlipidemia)    Hypertension    Hypothyroidism    Insomnia    Plantar fasciitis    left foot   PONV (postoperative nausea and vomiting)     Patient Active Problem List   Diagnosis Date Noted   Restless leg syndrome 11/21/2019   Brain aneurysm 07/11/2018   Carotid stenosis, asymptomatic, unspecified laterality 05/15/2018    Past Surgical History:  Procedure Laterality Date   APPENDECTOMY  2001   BREAST LUMPECTOMY Left 1994   CARPAL TUNNEL RELEASE Right    CESAREAN SECTION     IR ANGIO EXTRACRAN SEL COM CAROTID INNOMINATE UNI R MOD SED  07/11/2018   IR ANGIO INTRA EXTRACRAN SEL COM CAROTID INNOMINATE BILAT MOD SED  05/10/2018   IR ANGIO INTRA EXTRACRAN SEL COM  CAROTID INNOMINATE BILAT MOD SED  12/10/2019   IR ANGIO INTRA EXTRACRAN SEL INTERNAL CAROTID UNI L MOD SED  07/11/2018   IR ANGIO VERTEBRAL SEL SUBCLAVIAN INNOMINATE UNI R MOD SED  07/11/2018   IR ANGIO VERTEBRAL SEL VERTEBRAL BILAT MOD SED  05/10/2018   IR ANGIO VERTEBRAL SEL VERTEBRAL UNI L MOD SED  07/11/2018   IR ANGIO VERTEBRAL SEL VERTEBRAL UNI R MOD SED  12/10/2019   IR ANGIOGRAM FOLLOW UP STUDY  07/11/2018   IR INTRAVSC STENT CERV CAROTID W/EMB-PROT MOD SED INCL ANGIO  05/15/2018   IR NEURO EACH ADD'L AFTER BASIC UNI LEFT (MS)  07/11/2018   IR TRANSCATH/EMBOLIZ  07/11/2018   IR US GUIDE VASC ACCESS RIGHT  12/10/2019   PARTIAL HYSTERECTOMY  1994   RADIOLOGY WITH ANESTHESIA N/A 05/10/2018   Procedure: EMBOLIZATION;  Surgeon: Luanne Bras, MD;  Location: [facility];  Service: Radiology;  Laterality: N/A;   RADIOLOGY WITH ANESTHESIA N/A 05/15/2018   Procedure: Angioplasty with stenting;  Surgeon: Luanne Bras, MD;  Location: [facility];  Service: Radiology;  Laterality: N/A;   RADIOLOGY WITH ANESTHESIA N/A 07/11/2018   Procedure: IR WITH ANESTHESIAANEURYSM/EMBOLIZATION;  Surgeon: Luanne Bras, MD;  Location: [facility];  Service: Radiology;  Laterality: N/A;   TONSILLECTOMY  OB History   No obstetric history on file.     Family History  Problem Relation Age of Onset   CAD Father 4       CABG   Cancer Mother        BREAST   Restless legs syndrome Mother    Healthy Brother    Healthy Brother    Diabetes Sister    Cancer Sister        BREAST   Restless legs syndrome Sister    Healthy Son    Healthy Daughter    Heart disease Maternal Grandmother    Heart disease Maternal Grandfather    Emphysema Paternal Grandfather     Social History   Tobacco Use   Smoking status: Former    Packs/day: 0.50    Years: 10.00    Pack years: 5.00    Types: Cigarettes    Quit date: 09/26/2009    Years since quitting: 11.7   Smokeless tobacco: Never  Vaping Use   Vaping  Use: Never used  Substance Use Topics   Alcohol use: Yes    Alcohol/week: 2.0 standard drinks    Types: 2 Glasses of wine per week    Comment: rare once twice a month   Drug use: No    Home Medications Prior to Admission medications   Medication Sig Start Date End Date Taking? Authorizing Provider  aspirin EC 325 MG tablet Take 325 mg by mouth at bedtime.    [provider]  atorvastatin (LIPITOR) 40 MG tablet Take 1 tablet (40 mg total) by mouth daily. Patient taking differently: Take 40 mg by mouth every evening. 08/15/17 05/21/21  Josue Hector, MD  AYR SALINE NASAL NA Place 1 spray into the nose daily as needed (congestion).    [provider]  Calcium Carb-Cholecalciferol 600-800 MG-UNIT TABS Take 1 tablet by mouth at bedtime.    [provider]  calcium carbonate (TUMS EX) 750 MG chewable tablet Chew 750 mg by mouth daily.    [provider]  cetirizine (ZYRTEC) 10 MG tablet Take 10 mg by mouth at bedtime.    [provider]  cholecalciferol (VITAMIN D) 1000 units tablet Take 1,000 Units by mouth 2 (two) times daily.    [provider]  clopidogrel (PLAVIX) 75 MG tablet Take 1 tablet (75 mg total) by mouth daily. 03/16/21   Louk, Bea Graff, PA-C  famotidine (PEPCID) 20 MG tablet Take 20 mg by mouth at bedtime.    [provider]  Liniments (BLUE-EMU SUPER STRENGTH EX) Apply 1 application topically daily as needed (back pain).    [provider]  Magnesium 500 MG CAPS Take 500 mg by mouth at bedtime.     [provider]  Melatonin 5 MG CAPS Take 5 mg by mouth at bedtime.    [provider]  Menthol, Topical Analgesic, (BIOFREEZE EX) Apply 1 application topically daily as needed (back pain).    [provider]  Polyethyl Glycol-Propyl Glycol (SYSTANE OP) Place 1 drop into both eyes daily as needed (irritation).    [provider]  propranolol ER (INDERAL LA) 80 MG 24 hr capsule  Take 1 capsule (80 mg total) by mouth daily. 01/24/17   Tat, Eustace Quail, DO  Rimegepant Sulfate (NURTEC) 75 MG TBDP Samples of this drug were given to the patient, quantity 3, Lot Number [id number] 05/24/21   Tat, Eustace Quail, DO  rOPINIRole (REQUIP) 0.25 MG tablet TAKE 1-2 TABLETS (  0.25-0.5 MG TOTAL) BY MOUTH AT BEDTIME. 10/21/20   Gregor Hams, MD  SYNTHROID 112 MCG tablet Take 112 mcg by mouth daily. 01/18/17   [provider]  zolpidem (AMBIEN) 5 MG tablet Take 5 mg at bedtime by mouth.    [provider]    Allergies    Clams [shellfish allergy], Augmentin [amoxicillin-pot clavulanate], and Biaxin [clarithromycin]  Review of Systems   Review of Systems  Musculoskeletal:  Positive for joint swelling.  All other systems reviewed and are negative.  Physical Exam Updated Vital Signs BP (!) 145/92   Pulse 73   Temp 98.3 F (36.8 C) (Oral)   Resp 16   Ht 5\' 8"  (1.727 m)   Wt 101.6 kg   SpO2 96%   BMI 34.06 kg/m   Physical Exam Vitals and nursing note reviewed.  Constitutional:      General: She is not in acute distress.    Appearance: Normal appearance.  HENT:     Head: Normocephalic and atraumatic.  Eyes:     General:        Right eye: No discharge.        Left eye: No discharge.  Cardiovascular:     Rate and Rhythm: Normal rate and regular rhythm.     Pulses: Normal pulses.     Comments: Intact DP and PT pulses of the right leg Pulmonary:     Effort: Pulmonary effort is normal. No respiratory distress.  Musculoskeletal:        General: No deformity.     Comments: Right knee pain most focally at the medial aspect of the knee inferior to the patella.  No tenderness of the patellar or quadriceps tendon, no evidence of tendon rupture.  No balloon or ballottement, no effusion noted.  No laxity with varus, valgus, anterior posterior drawer.  Intact strength 5 out of 5.  Decreased active range of motion with flexion and extension secondary to tightness.  No  decrease in passive range of motion.  Patient is able to walk without difficulty, no pain with gait.  Skin:    General: Skin is warm and dry.     Capillary Refill: Capillary refill takes less than 2 seconds.  Neurological:     Mental Status: She is alert and oriented to person, place, and time.     Sensory: No sensory deficit.  Psychiatric:        Mood and Affect: Mood normal.        Behavior: Behavior normal.    ED Results / Procedures / Treatments   Labs (all labs ordered are listed, but only abnormal results are displayed) Labs Reviewed - No data to display  EKG None  Radiology DG Knee Complete 4 Views Right  Result Date: 07/04/2021 CLINICAL DATA:  Right knee pain since yesterday when cleaning on knees with swelling EXAM: RIGHT KNEE - COMPLETE 4+ VIEW COMPARISON:  None. FINDINGS: No fracture, joint effusion or dislocation. Small superior right patellar enthesophyte. No suspicious focal osseous lesions. No significant degenerative arthropathy. No radiopaque foreign bodies. IMPRESSION: No right knee fracture, joint effusion or malalignment. Small superior right patellar enthesophyte. Electronically Signed   By: Ilona Sorrel M.D.   On: 07/04/2021 13:30    Procedures Procedures   Medications Ordered in ED Medications  dexamethasone (DECADRON) tablet 12 mg (has no administration in time range)    ED Course  I have reviewed the triage vital signs and the nursing notes.  Pertinent labs & imaging results  that were available during my care of the patient were reviewed by me and considered in my medical decision making (see chart for details).    MDM Rules/Calculators/A&P                         Right knee pain consistent with potential bursitis.  Location of pain most consistent with Pes anserine bursitis.  Right leg is neurovascularly intact.  No back pain.  Intact gait without difficulty.  Patient already using Voltaren gel and Tylenol which I suggest that she continue.  Do  recommend additional anti-inflammatory, however in context of patient's existing blood thinners patient cannot take ibuprofen or other oral nonsteroidal anti-inflammatory drugs.  We will give her option between short course of prednisone taper versus single dose of Decadron in office with follow-up for orthopedics.  Patient opts for single dose of Decadron at this time.  We will also provide a knee brace, encouraged rest, ice or heat, compression and elevation.  Return precautions given.  Patient discharged in stable condition.  Final Clinical Impression(s) / ED Diagnoses Final diagnoses:  Acute pain of right knee    Rx / DC Orders ED Discharge Orders     None        Anselmo Ramo, PA-C 07/04/21 1440    Gareth Morgan, MD 07/04/21 2311

## 2021-07-04 NOTE — ED Notes (Signed)
ED Provider at bedside. 

## 2021-07-04 NOTE — ED Notes (Signed)
Pt NAD, a/ox4. Pt verbalizes understanding of all DC and f/u instructions. All questions answered. Pt walks with steady gait to lobby at DC.  ? ?

## 2021-07-04 NOTE — Discharge Instructions (Signed)
As we discussed during review continue take Tylenol, apply Voltaren gel, and I am giving you a dose of steroids today to help with inflammation.  I recommend you wear the knee brace as needed, rest, elevate the leg, apply ice or heat as needed for pain control.  Gently increase your physical activity as tolerated over the next several days as your pain allows.  Please follow-up with orthopedics if you have no resolution of knee pain within the week.

## 2021-07-13 ENCOUNTER — Encounter: Payer: Self-pay | Admitting: Family Medicine

## 2021-07-13 ENCOUNTER — Ambulatory Visit: Payer: Self-pay

## 2021-07-13 ENCOUNTER — Ambulatory Visit (INDEPENDENT_AMBULATORY_CARE_PROVIDER_SITE_OTHER): Payer: Medicare Other | Admitting: Family Medicine

## 2021-07-13 VITALS — BP 120/82 | Ht 68.0 in | Wt 224.0 lb

## 2021-07-13 DIAGNOSIS — M222X1 Patellofemoral disorders, right knee: Secondary | ICD-10-CM | POA: Diagnosis not present

## 2021-07-13 DIAGNOSIS — M25561 Pain in right knee: Secondary | ICD-10-CM

## 2021-07-13 NOTE — Progress Notes (Signed)
Amanda Fry - 70 y.o. female MRN [id number]  Date of birth: 09-06-1951  SUBJECTIVE:  Including CC & ROS.  No chief complaint on file.   Amanda Fry is a 70 y.o. female that is presenting with right knee pain.  The pain has improved since being seen in the emergency department.  She initially felt the pain when she is kneeling on the right knee.  No history of similar pain..  Independent review of the right knee x-ray from 10/9 shows no acute changes.   Review of Systems See HPI   HISTORY: Past Medical, Surgical, Social, and Family History Reviewed & Updated per EMR.   Pertinent Historical Findings include:  Past Medical History:  Diagnosis Date   Allergic rhinitis    Anterior communicating artery aneurysm    Anxiety    Carpal tunnel syndrome    Cyst of kidney, acquired    Esophageal reflux    Family history of adverse reaction to anesthesia    sister N/V with anesthesia   Fibromyalgia    GERD (gastroesophageal reflux disease)    Headache    HLD (hyperlipidemia)    Hypertension    Hypothyroidism    Insomnia    Plantar fasciitis    left foot   PONV (postoperative nausea and vomiting)     Past Surgical History:  Procedure Laterality Date   APPENDECTOMY  2001   BREAST LUMPECTOMY Left 1994   CARPAL TUNNEL RELEASE Right    CESAREAN SECTION     IR ANGIO EXTRACRAN SEL COM CAROTID INNOMINATE UNI R MOD SED  07/11/2018   IR ANGIO INTRA EXTRACRAN SEL COM CAROTID INNOMINATE BILAT MOD SED  05/10/2018   IR ANGIO INTRA EXTRACRAN SEL COM CAROTID INNOMINATE BILAT MOD SED  12/10/2019   IR ANGIO INTRA EXTRACRAN SEL INTERNAL CAROTID UNI L MOD SED  07/11/2018   IR ANGIO VERTEBRAL SEL SUBCLAVIAN INNOMINATE UNI R MOD SED  07/11/2018   IR ANGIO VERTEBRAL SEL VERTEBRAL BILAT MOD SED  05/10/2018   IR ANGIO VERTEBRAL SEL VERTEBRAL UNI L MOD SED  07/11/2018   IR ANGIO VERTEBRAL SEL VERTEBRAL UNI R MOD SED  12/10/2019   IR ANGIOGRAM FOLLOW UP STUDY  07/11/2018   IR INTRAVSC STENT  CERV CAROTID W/EMB-PROT MOD SED INCL ANGIO  05/15/2018   IR NEURO EACH ADD'L AFTER BASIC UNI LEFT (MS)  07/11/2018   IR TRANSCATH/EMBOLIZ  07/11/2018   IR US GUIDE VASC ACCESS RIGHT  12/10/2019   PARTIAL HYSTERECTOMY  1994   RADIOLOGY WITH ANESTHESIA N/A 05/10/2018   Procedure: EMBOLIZATION;  Surgeon: Luanne Bras, MD;  Location: [facility];  Service: Radiology;  Laterality: N/A;   RADIOLOGY WITH ANESTHESIA N/A 05/15/2018   Procedure: Angioplasty with stenting;  Surgeon: Luanne Bras, MD;  Location: [place];  Service: Radiology;  Laterality: N/A;   RADIOLOGY WITH ANESTHESIA N/A 07/11/2018   Procedure: IR WITH ANESTHESIAANEURYSM/EMBOLIZATION;  Surgeon: Luanne Bras, MD;  Location: [facility];  Service: Radiology;  Laterality: N/A;   TONSILLECTOMY      Family History  Problem Relation Age of Onset   CAD Father 55       CABG   Cancer Mother        BREAST   Restless legs syndrome Mother    Healthy Brother    Healthy Brother    Diabetes Sister    Cancer Sister        BREAST   Restless legs syndrome Sister    Healthy Son    Healthy  Daughter    Heart disease Maternal Grandmother    Heart disease Maternal Grandfather    Emphysema Paternal Grandfather     Social History   Socioeconomic History   Marital status: Married    Spouse name: Not on file   Number of children: Not on file   Years of education: Not on file   Highest education level: Not on file  Occupational History   Not on file  Tobacco Use   Smoking status: Former    Packs/day: 0.50    Years: 10.00    Pack years: 5.00    Types: Cigarettes    Quit date: 09/26/2009    Years since quitting: 11.8   Smokeless tobacco: Never  Vaping Use   Vaping Use: Never used  Substance and Sexual Activity   Alcohol use: Yes    Alcohol/week: 2.0 standard drinks    Types: 2 Glasses of wine per week    Comment: rare once twice a month   Drug use: No   Sexual activity: Not on file  Other Topics Concern   Not on file   Social History Narrative   Not on file   Social Determinants of Health   Financial Resource Strain: Not on file  Food Insecurity: Not on file  Transportation Needs: Not on file  Physical Activity: Not on file  Stress: Not on file  Social Connections: Not on file  Intimate Partner Violence: Not on file     PHYSICAL EXAM:  VS: BP 120/82 (BP Location: Left Arm, Patient Position: Sitting, Cuff Size: Large)   Ht 5\' 8"  (1.727 m)   Wt 224 lb (101.6 kg)   BMI 34.06 kg/m  Physical Exam Gen: NAD, alert, cooperative with exam, well-appearing   Limited ultrasound: Right knee:  No effusion in the suprapatellar pouch.  Some spurring at the insertion of the quadriceps tendon into the superior pole of the patella. Normal-appearing patellar tendon. Normal-appearing medial and lateral meniscus  Summary: No structural changes appreciated  Ultrasound and interpretation by Clearance Coots, MD     ASSESSMENT & PLAN:   Patellofemoral pain syndrome of right knee No significant structural changes appreciated on exam -Counseled on home exercise therapy and supportive care. -Continue with water therapy -Could consider physical therapy

## 2021-07-13 NOTE — Patient Instructions (Signed)
Nice to meet you Please try the exercises  Please use ice as needed   Please send me a message in MyChart with any questions or updates.  Please see me back as needed.   --Dr. Raeford Razor

## 2021-07-13 NOTE — Assessment & Plan Note (Signed)
No significant structural changes appreciated on exam -Counseled on home exercise therapy and supportive care. -Continue with water therapy -Could consider physical therapy

## 2021-07-15 DIAGNOSIS — K649 Unspecified hemorrhoids: Secondary | ICD-10-CM | POA: Diagnosis not present

## 2021-07-15 DIAGNOSIS — D120 Benign neoplasm of cecum: Secondary | ICD-10-CM | POA: Diagnosis not present

## 2021-07-15 DIAGNOSIS — K5730 Diverticulosis of large intestine without perforation or abscess without bleeding: Secondary | ICD-10-CM | POA: Diagnosis not present

## 2021-07-15 DIAGNOSIS — Z86010 Personal history of colonic polyps: Secondary | ICD-10-CM | POA: Diagnosis not present

## 2021-07-15 DIAGNOSIS — D124 Benign neoplasm of descending colon: Secondary | ICD-10-CM | POA: Diagnosis not present

## 2021-07-20 DIAGNOSIS — D124 Benign neoplasm of descending colon: Secondary | ICD-10-CM | POA: Diagnosis not present

## 2021-07-20 DIAGNOSIS — D120 Benign neoplasm of cecum: Secondary | ICD-10-CM | POA: Diagnosis not present

## 2021-08-02 DIAGNOSIS — Z20822 Contact with and (suspected) exposure to covid-19: Secondary | ICD-10-CM | POA: Diagnosis not present

## 2021-08-04 ENCOUNTER — Telehealth (HOSPITAL_COMMUNITY): Payer: Self-pay

## 2021-08-04 ENCOUNTER — Other Ambulatory Visit (HOSPITAL_COMMUNITY): Payer: Self-pay | Admitting: Interventional Radiology

## 2021-08-04 DIAGNOSIS — I671 Cerebral aneurysm, nonruptured: Secondary | ICD-10-CM

## 2021-08-04 DIAGNOSIS — I771 Stricture of artery: Secondary | ICD-10-CM

## 2021-08-04 NOTE — Telephone Encounter (Signed)
Called to schedule f/u, no answer, left vm. AW 

## 2021-08-12 DIAGNOSIS — Z1231 Encounter for screening mammogram for malignant neoplasm of breast: Secondary | ICD-10-CM | POA: Diagnosis not present

## 2021-08-19 IMAGING — DX DG LUMBAR SPINE COMPLETE 4+V
5 series · 5 of 5 positions shown · non-contrast
Comparison: CT abdomen and pelvis - 01/26/2012

CLINICAL DATA: Low back pain for the past year.  No known injury.

EXAM:
LUMBAR SPINE - COMPLETE 4+ VIEW

[lumbar spine ap]
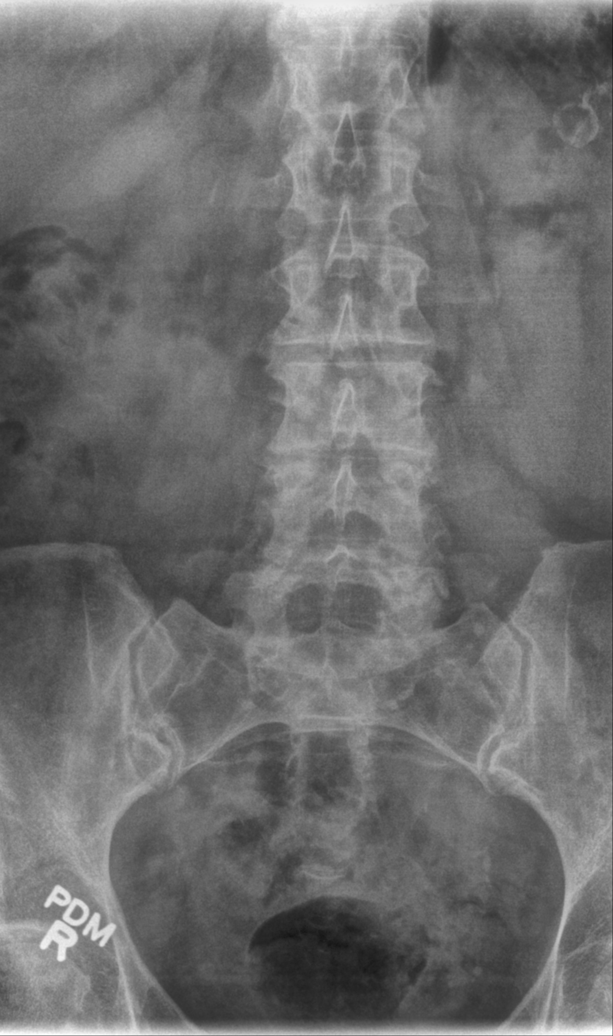

[lumbar spine mlo (1 of 2)]
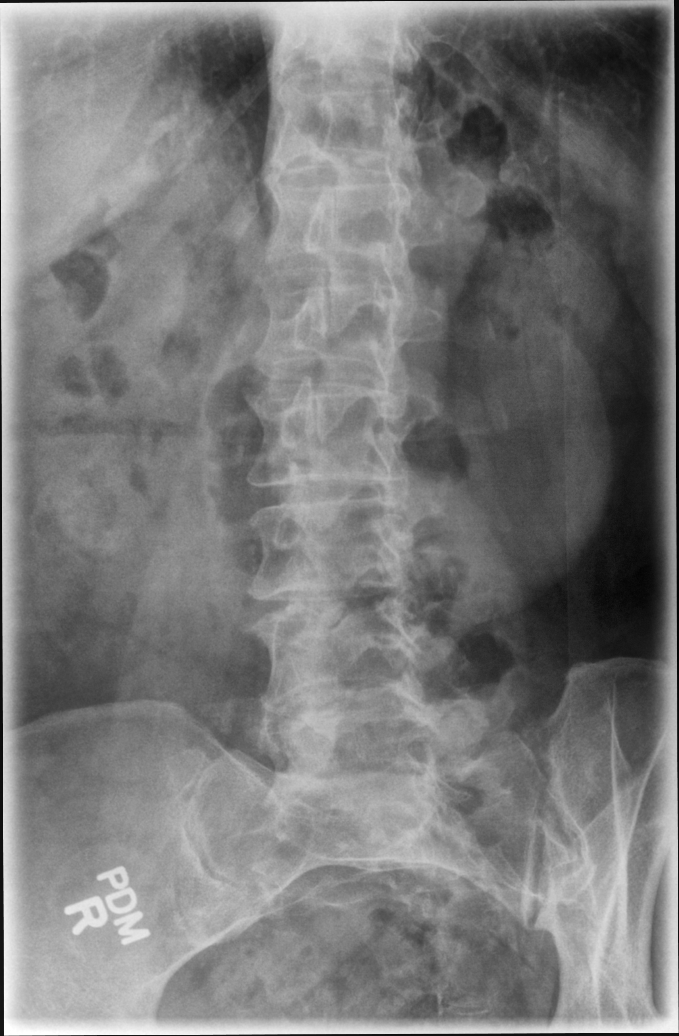

[lumbar spine mlo (2 of 2)]
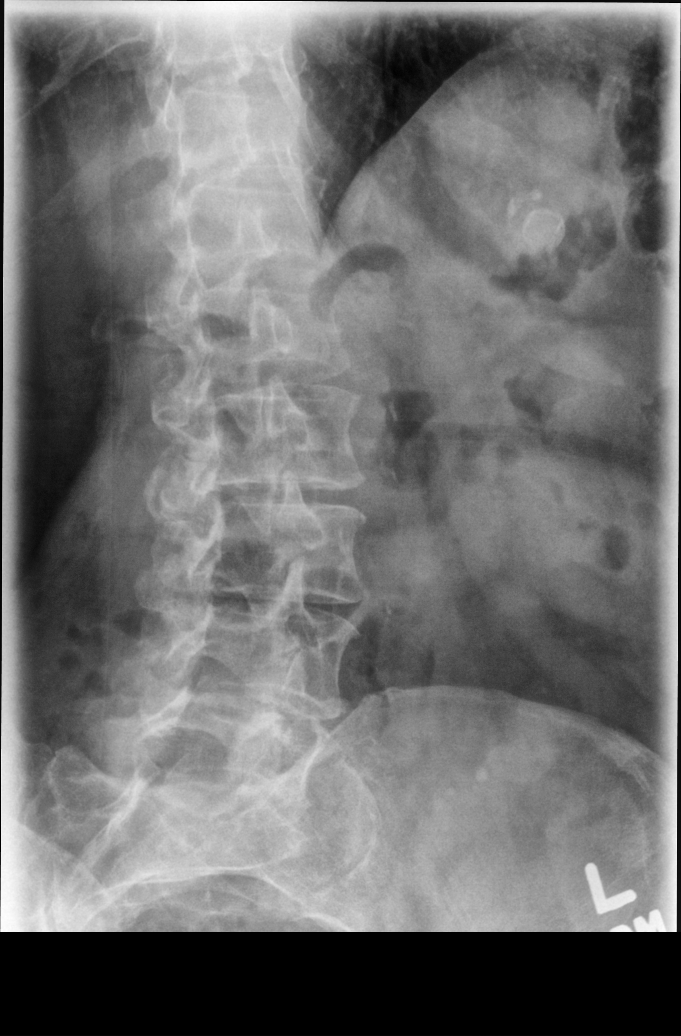

[lumbar spine lat (1 of 2)]
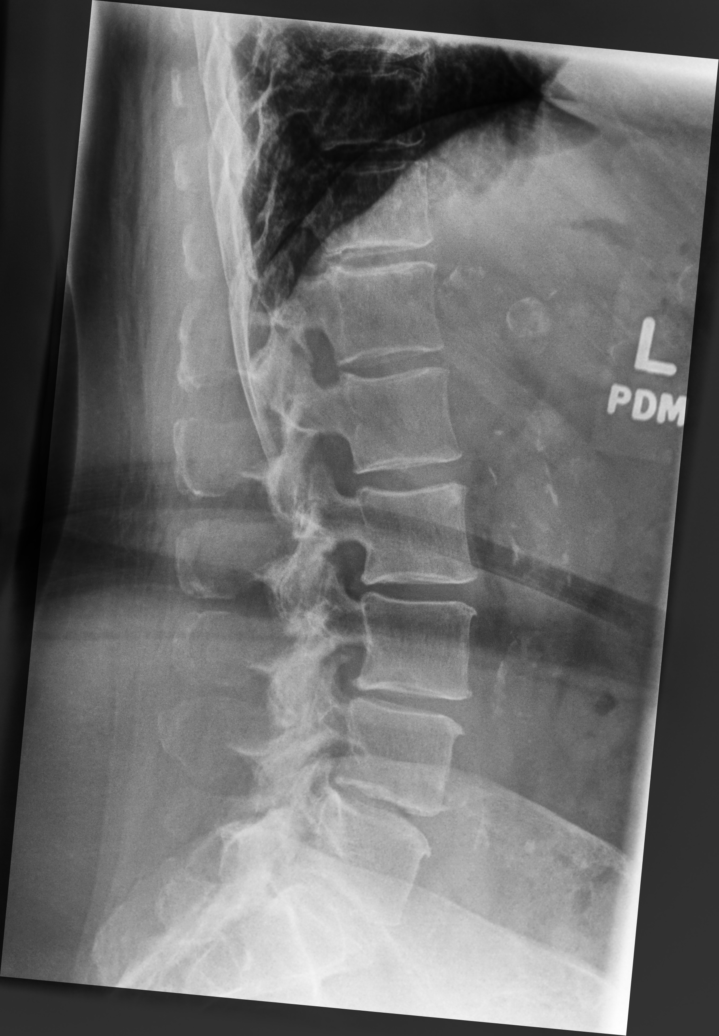

[lumbar spine lat (2 of 2)]
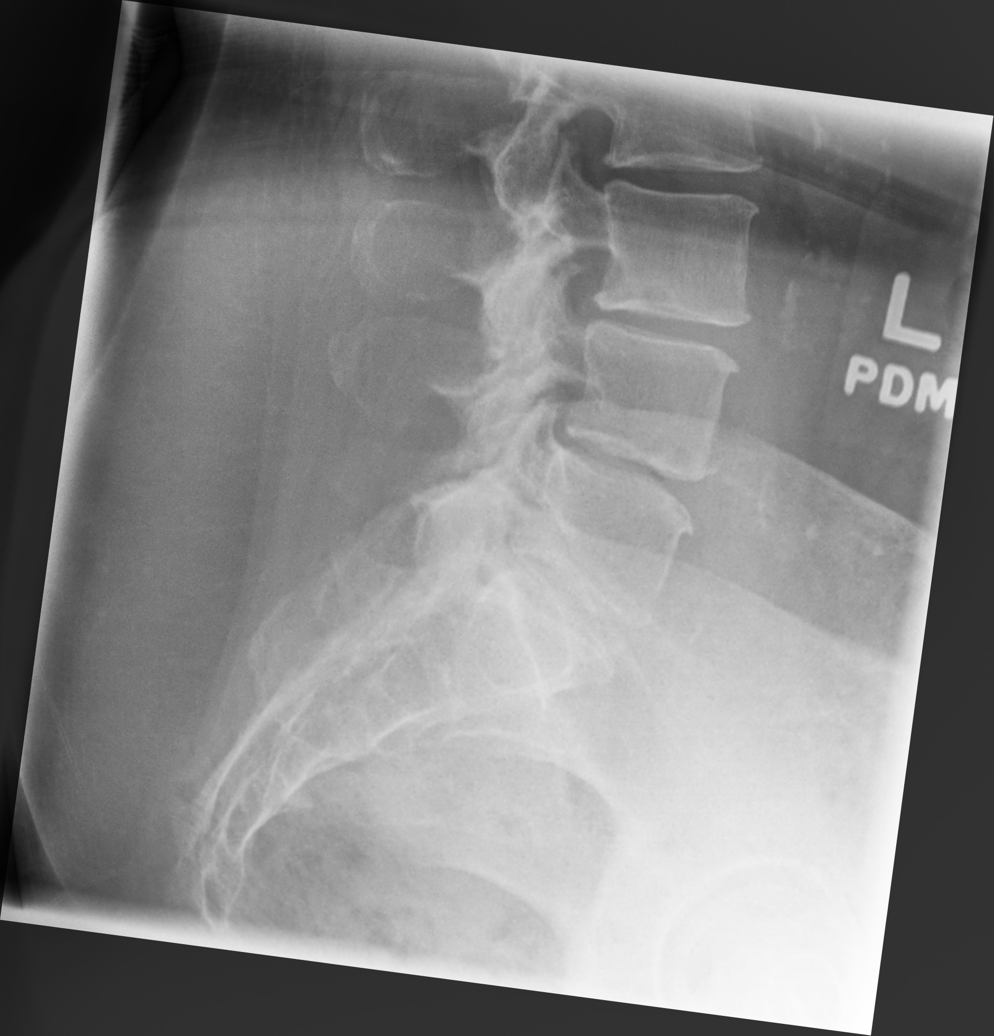

[5 of 5 positions shown; findings below may reference images not displayed]

FINDINGS: There are 6 non rib-bearing lumbar type vertebral bodies. For the
purposes of this dictation, lumbar levels be labeled L1-L6.

Normal alignment of the lumbar spine. No anterolisthesis or
retrolisthesis

Lumbar vertebral body heights are preserved.

Mild to moderate multilevel lumbar spine DDD, worse at L5-L6 and to
a lesser extent, L3-L4 and L4-L5 with disc space height loss,
endplate irregularity and sclerosis.

Bilateral facet degenerative change of the lower lumbar spine.

Limited visualization of the bilateral SI joints is normal.
Atherosclerotic plaque within the abdominal aorta. Vascular
calcification overlies the left upper abdomen compatible with
splenic artery aneurysm demonstrated on remote abdominal CT
performed [DATE].

Moderate colonic stool burden without evidence of enteric
obstruction. Punctate phlebolith overlies the left hemipelvis.
IMPRESSION: 1. Transitional anatomy with spinal labeling as above.
2. Mild-to-moderate multilevel lumbar spine DDD, worse at L5-L6.

## 2021-08-24 ENCOUNTER — Other Ambulatory Visit: Payer: Self-pay

## 2021-08-24 ENCOUNTER — Emergency Department (HOSPITAL_BASED_OUTPATIENT_CLINIC_OR_DEPARTMENT_OTHER): Payer: Medicare Other

## 2021-08-24 ENCOUNTER — Encounter (HOSPITAL_BASED_OUTPATIENT_CLINIC_OR_DEPARTMENT_OTHER): Payer: Self-pay | Admitting: Emergency Medicine

## 2021-08-24 ENCOUNTER — Emergency Department (HOSPITAL_BASED_OUTPATIENT_CLINIC_OR_DEPARTMENT_OTHER)
Admission: EM | Admit: 2021-08-24 | Discharge: 2021-08-24 | Disposition: A | Discharge status: Home / Self Care | Payer: Medicare Other | Attending: Emergency Medicine | Admitting: Emergency Medicine

## 2021-08-24 DIAGNOSIS — Z7982 Long term (current) use of aspirin: Secondary | ICD-10-CM | POA: Insufficient documentation

## 2021-08-24 DIAGNOSIS — R109 Unspecified abdominal pain: Secondary | ICD-10-CM | POA: Diagnosis not present

## 2021-08-24 DIAGNOSIS — Z79899 Other long term (current) drug therapy: Secondary | ICD-10-CM | POA: Diagnosis not present

## 2021-08-24 DIAGNOSIS — Z87891 Personal history of nicotine dependence: Secondary | ICD-10-CM | POA: Insufficient documentation

## 2021-08-24 DIAGNOSIS — K5792 Diverticulitis of intestine, part unspecified, without perforation or abscess without bleeding: Secondary | ICD-10-CM

## 2021-08-24 DIAGNOSIS — Z7902 Long term (current) use of antithrombotics/antiplatelets: Secondary | ICD-10-CM | POA: Diagnosis not present

## 2021-08-24 DIAGNOSIS — E039 Hypothyroidism, unspecified: Secondary | ICD-10-CM | POA: Insufficient documentation

## 2021-08-24 DIAGNOSIS — I10 Essential (primary) hypertension: Secondary | ICD-10-CM | POA: Insufficient documentation

## 2021-08-24 DIAGNOSIS — R1030 Lower abdominal pain, unspecified: Secondary | ICD-10-CM | POA: Diagnosis present

## 2021-08-24 LAB — COMPREHENSIVE METABOLIC PANEL
ALT: 20 U/L (ref 0–44)
AST: 16 U/L (ref 15–41)
Albumin: 4.5 g/dL (ref 3.5–5.0)
Alkaline Phosphatase: 67 U/L (ref 38–126)
Anion gap: 8 (ref 5–15)
BUN: 10 mg/dL (ref 8–23)
CO2: 28 mmol/L (ref 22–32)
Calcium: 10.1 mg/dL (ref 8.9–10.3)
Chloride: 104 mmol/L (ref 98–111)
Creatinine, Ser: 0.91 mg/dL (ref 0.44–1.00)
GFR, Estimated: >60 mL/min (ref >60)
Glucose, Bld: 113 mg/dL — ABNORMAL HIGH (ref 70–99)
Potassium: 4.4 mmol/L (ref 3.5–5.1)
Sodium: 140 mmol/L (ref 135–145)
Total Bilirubin: 0.7 mg/dL (ref 0.3–1.2)
Total Protein: 6.8 g/dL (ref 6.5–8.1)

## 2021-08-24 LAB — CBC
HCT: 43.1 % (ref 36.0–46.0)
Hemoglobin: 13.9 g/dL (ref 12.0–15.0)
MCH: 30.6 pg (ref 26.0–34.0)
MCHC: 32.3 g/dL (ref 30.0–36.0)
MCV: 94.9 fL (ref 80.0–100.0)
Platelets: 188 10*3/uL (ref 150–400)
RBC: 4.54 MIL/uL (ref 3.87–5.11)
RDW: 12.5 % (ref 11.5–15.5)
WBC: 8.2 10*3/uL (ref 4.0–10.5)
nRBC: 0 % (ref 0.0–0.2)

## 2021-08-24 LAB — URINALYSIS, ROUTINE W REFLEX MICROSCOPIC
Bilirubin Urine: NEGATIVE
Glucose, UA: NEGATIVE mg/dL
Hgb urine dipstick: NEGATIVE
Ketones, ur: NEGATIVE mg/dL
Leukocytes,Ua: NEGATIVE
Nitrite: NEGATIVE
Protein, ur: NEGATIVE mg/dL
Specific Gravity, Urine: 1.011 (ref 1.005–1.030)
pH: 7 (ref 5.0–8.0)

## 2021-08-24 LAB — LIPASE, BLOOD: Lipase: 26 U/L (ref 11–51)

## 2021-08-24 MED ORDER — CIPROFLOXACIN HCL 500 MG PO TABS: 500.0000 mg | ORAL_TABLET | Freq: Two times a day (BID) | ORAL | 0 refills | Status: DC

## 2021-08-24 MED ORDER — HYDROCODONE-ACETAMINOPHEN 5-325 MG PO TABS
1.0000 | ORAL_TABLET | Freq: Once | ORAL | Status: AC
  Administered 2021-08-24: 1 via ORAL

## 2021-08-24 MED ORDER — METRONIDAZOLE 500 MG PO TABS: 500.0000 mg | ORAL_TABLET | Freq: Two times a day (BID) | ORAL | 0 refills | Status: DC

## 2021-08-24 MED ORDER — IOHEXOL 300 MG/ML  SOLN
85.0000 mL | Freq: Once | INTRAMUSCULAR | Status: AC | PRN
  Administered 2021-08-24: 85 mL via INTRAVENOUS

## 2021-08-24 NOTE — ED Notes (Signed)
RT Note: Veinous blood drawn/labelled/given to lab.

## 2021-08-24 NOTE — ED Notes (Signed)
Pt transported to CT ?

## 2021-08-24 NOTE — ED Notes (Signed)
Pt d/c home per MD order. Discharge summary reviewed with pt, pt verbalizes understanding. Ambulatory off unit. No s/s of acute distress noted at discharge. Reports husband is discharge ride home.

## 2021-08-24 NOTE — ED Triage Notes (Signed)
Pt c/o low abd pain radiating into her back since Saturday. Endorses nausea, mild constipation. Took a laxative yesterday. Denies urinary symptoms.

## 2021-08-24 NOTE — ED Provider Notes (Signed)
[facility] EMERGENCY DEPT Provider Note   CSN: [id number] Arrival date & time: 08/24/21  0865     History Chief Complaint  Patient presents with   Abdominal Pain    Amanda Fry is a 70 y.o. female with history of appendectomy and hysterectomy who presents the emergency department today with a 2-day history of waxing and waning worsening lower abdominal pain.  Patient states that on Saturday she had the urge to defecate and had a large bowel movement and since then has had pain.  Pain is worse with ambulation and movement.  She describes it as a cramping and sharp sensation.  She currently rates her abdominal pain 8/10 in severity.  She reports associated nausea and subjective fever.  No chills, vomiting, diarrhea, dysuria, urinary frequency/urgency, hematuria.  Patient thought this was related to her bladder and took several doses of her husband's Keflex.  This did not help.  Patient did have a colonoscopy 2 weeks ago which she states was "clean."  She did have 2 polyps removed and does have a history of diverticulosis.   Abdominal Pain     Past Medical History:  Diagnosis Date   Allergic rhinitis    Anterior communicating artery aneurysm    Anxiety    Carpal tunnel syndrome    Cyst of kidney, acquired    Esophageal reflux    Family history of adverse reaction to anesthesia    sister N/V with anesthesia   Fibromyalgia    GERD (gastroesophageal reflux disease)    Headache    HLD (hyperlipidemia)    Hypertension    Hypothyroidism    Insomnia    Plantar fasciitis    left foot   PONV (postoperative nausea and vomiting)     Patient Active Problem List   Diagnosis Date Noted   Patellofemoral pain syndrome of right knee 07/13/2021   Restless leg syndrome 11/21/2019   Brain aneurysm 07/11/2018   Carotid stenosis, asymptomatic, unspecified laterality 05/15/2018    Past Surgical History:  Procedure Laterality Date   APPENDECTOMY  2001   BREAST  LUMPECTOMY Left 1994   CARPAL TUNNEL RELEASE Right    CESAREAN SECTION     IR ANGIO EXTRACRAN SEL COM CAROTID INNOMINATE UNI R MOD SED  07/11/2018   IR ANGIO INTRA EXTRACRAN SEL COM CAROTID INNOMINATE BILAT MOD SED  05/10/2018   IR ANGIO INTRA EXTRACRAN SEL COM CAROTID INNOMINATE BILAT MOD SED  12/10/2019   IR ANGIO INTRA EXTRACRAN SEL INTERNAL CAROTID UNI L MOD SED  07/11/2018   IR ANGIO VERTEBRAL SEL SUBCLAVIAN INNOMINATE UNI R MOD SED  07/11/2018   IR ANGIO VERTEBRAL SEL VERTEBRAL BILAT MOD SED  05/10/2018   IR ANGIO VERTEBRAL SEL VERTEBRAL UNI L MOD SED  07/11/2018   IR ANGIO VERTEBRAL SEL VERTEBRAL UNI R MOD SED  12/10/2019   IR ANGIOGRAM FOLLOW UP STUDY  07/11/2018   IR INTRAVSC STENT CERV CAROTID W/EMB-PROT MOD SED INCL ANGIO  05/15/2018   IR NEURO EACH ADD'L AFTER BASIC UNI LEFT (MS)  07/11/2018   IR TRANSCATH/EMBOLIZ  07/11/2018   IR US GUIDE VASC ACCESS RIGHT  12/10/2019   PARTIAL HYSTERECTOMY  1994   RADIOLOGY WITH ANESTHESIA N/A 05/10/2018   Procedure: EMBOLIZATION;  Surgeon: Luanne Bras, MD;  Location: [facility] OR;  Service: Radiology;  Laterality: N/A;   RADIOLOGY WITH ANESTHESIA N/A 05/15/2018   Procedure: Angioplasty with stenting;  Surgeon: Luanne Bras, MD;  Location: [facility];  Service: Radiology;  Laterality: N/A;  RADIOLOGY WITH ANESTHESIA N/A 07/11/2018   Procedure: IR WITH ANESTHESIAANEURYSM/EMBOLIZATION;  Surgeon: Luanne Bras, MD;  Location: [facility];  Service: Radiology;  Laterality: N/A;   TONSILLECTOMY       OB History   No obstetric history on file.     Family History  Problem Relation Age of Onset   CAD Father 71       CABG   Cancer Mother        BREAST   Restless legs syndrome Mother    Healthy Brother    Healthy Brother    Diabetes Sister    Cancer Sister        BREAST   Restless legs syndrome Sister    Healthy Son    Healthy Daughter    Heart disease Maternal Grandmother    Heart disease Maternal Grandfather    Emphysema  Paternal Grandfather     Social History   Tobacco Use   Smoking status: Former    Packs/day: 0.50    Years: 10.00    Pack years: 5.00    Types: Cigarettes    Quit date: 09/26/2009    Years since quitting: 11.9   Smokeless tobacco: Never  Vaping Use   Vaping Use: Never used  Substance Use Topics   Alcohol use: Yes    Alcohol/week: 2.0 standard drinks    Types: 2 Glasses of wine per week    Comment: rare once twice a month   Drug use: No    Home Medications Prior to Admission medications   Medication Sig Start Date End Date Taking? Authorizing Provider  ciprofloxacin (CIPRO) 500 MG tablet Take 1 tablet (500 mg total) by mouth every 12 (twelve) hours. 08/24/21  Yes Raul Del, Nikia Mangino M, PA-C  metroNIDAZOLE (FLAGYL) 500 MG tablet Take 1 tablet (500 mg total) by mouth 2 (two) times daily. 08/24/21  Yes Myna Bright M, PA-C  aspirin EC 325 MG tablet Take 325 mg by mouth at bedtime.    [provider]  atorvastatin (LIPITOR) 40 MG tablet Take 1 tablet (40 mg total) by mouth daily. Patient taking differently: Take 40 mg by mouth every evening. 08/15/17 05/21/21  Josue Hector, MD  AYR SALINE NASAL NA Place 1 spray into the nose daily as needed (congestion).    [provider]  Calcium Carb-Cholecalciferol 600-800 MG-UNIT TABS Take 1 tablet by mouth at bedtime.    [provider]  calcium carbonate (TUMS EX) 750 MG chewable tablet Chew 750 mg by mouth daily.    [provider]  cetirizine (ZYRTEC) 10 MG tablet Take 10 mg by mouth at bedtime.    [provider]  cholecalciferol (VITAMIN D) 1000 units tablet Take 1,000 Units by mouth 2 (two) times daily.    [provider]  clopidogrel (PLAVIX) 75 MG tablet Take 1 tablet (75 mg total) by mouth daily. 03/16/21   Louk, Bea Graff, PA-C  famotidine (PEPCID) 20 MG tablet Take 20 mg by mouth at bedtime.    [provider]  Liniments (BLUE-EMU SUPER STRENGTH EX) Apply 1 application  topically daily as needed (back pain).    [provider]  Magnesium 500 MG CAPS Take 500 mg by mouth at bedtime.     [provider]  Melatonin 5 MG CAPS Take 5 mg by mouth at bedtime.    [provider]  Menthol, Topical Analgesic, (BIOFREEZE EX) Apply 1 application topically daily as needed (back pain).    [provider]  Polyethyl  Glycol-Propyl Glycol (SYSTANE OP) Place 1 drop into both eyes daily as needed (irritation).    [provider]  propranolol ER (INDERAL LA) 80 MG 24 hr capsule Take 1 capsule (80 mg total) by mouth daily. 01/24/17   Tat, Eustace Quail, DO  Rimegepant Sulfate (NURTEC) 75 MG TBDP Samples of this drug were given to the patient, quantity 3, Lot Number [id number] 05/24/21   Tat, Eustace Quail, DO  rOPINIRole (REQUIP) 0.25 MG tablet TAKE 1-2 TABLETS (0.25-0.5 MG TOTAL) BY MOUTH AT BEDTIME. 10/21/20   Gregor Hams, MD  SYNTHROID 112 MCG tablet Take 112 mcg by mouth daily. 01/18/17   [provider]  zolpidem (AMBIEN) 5 MG tablet Take 5 mg at bedtime by mouth.    [provider]    Allergies    Clams [shellfish allergy], Augmentin [amoxicillin-pot clavulanate], and Biaxin [clarithromycin]  Review of Systems   Review of Systems  Gastrointestinal:  Positive for abdominal pain.  All other systems reviewed and are negative.  Physical Exam Updated Vital Signs BP (!) 146/82   Pulse 83   Temp 99.1 F (37.3 C)   Resp 16   Ht 5' 7.5" (1.715 m)   Wt 99.8 kg   SpO2 99%   BMI 33.95 kg/m   Physical Exam Vitals and nursing note reviewed.  Constitutional:      General: She is not in acute distress.    Appearance: Normal appearance.  HENT:     Head: Normocephalic and atraumatic.  Eyes:     General:        Right eye: No discharge.        Left eye: No discharge.  Cardiovascular:     Comments: Regular rate and rhythm.  S1/S2 are distinct without any evidence of murmur, rubs, or gallops.  Radial pulses are 2+  bilaterally.  Dorsalis pedis pulses are 2+ bilaterally.  No evidence of pedal edema. Pulmonary:     Comments: Clear to auscultation bilaterally.  Normal effort.  No respiratory distress.  No evidence of wheezes, rales, or rhonchi heard throughout. Abdominal:     General: Abdomen is flat. Bowel sounds are normal. There is no distension.     Tenderness: There is no guarding or rebound.     Comments: There is mild lower abdominal tenderness which is worse in the suprapubic region.  No obvious peritoneal signs.  Musculoskeletal:        General: Normal range of motion.     Cervical back: Neck supple.  Skin:    General: Skin is warm and dry.     Findings: No rash.  Neurological:     General: No focal deficit present.     Mental Status: She is alert.  Psychiatric:        Mood and Affect: Mood normal.        Behavior: Behavior normal.    ED Results / Procedures / Treatments   Labs (all labs ordered are listed, but only abnormal results are displayed) Labs Reviewed  COMPREHENSIVE METABOLIC PANEL - Abnormal; Notable for the following components:      Result Value   Glucose, Bld 113 (*)    All other components within normal limits  LIPASE, BLOOD  CBC  URINALYSIS, ROUTINE W REFLEX MICROSCOPIC    EKG None  Radiology CT ABDOMEN PELVIS W CONTRAST  Result Date: 08/24/2021 CLINICAL DATA:  Abdominal pain, acute, nonlocalized lower abdominal pain EXAM: CT ABDOMEN AND PELVIS WITH CONTRAST TECHNIQUE: Multidetector CT imaging of  the abdomen and pelvis was performed using the standard protocol following bolus administration of intravenous contrast. CONTRAST:  1mL OMNIPAQUE IOHEXOL 300 MG/ML  SOLN COMPARISON:  None. FINDINGS: Lower chest: No acute abnormality. Hepatobiliary: No focal liver abnormality is seen. No gallstones, gallbladder wall thickening, or biliary dilatation. Pancreas: Unremarkable. No pancreatic ductal dilatation or surrounding inflammatory changes. Spleen: Normal in size without  focal abnormality.  Small splenule. Adrenals/Urinary Tract: Adrenal glands are unremarkable. No hydronephrosis or nephrolithiasis. Multiple left-sided renal cysts, largest measuring up to 11.0 cm and appears simple. Bladder is unremarkable. Stomach/Bowel: The stomach is within normal limits. There is no evidence of bowel obstruction. Prior appendectomy. There is colonic diverticulosis, with adjacent inflammatory stranding along the sigmoid colon. No drainable fluid collection. Vascular/Lymphatic: Aortoiliac atherosclerotic calcifications. No AAA. No lymphadenopathy. Reproductive: Prior hysterectomy. Other: Tiny fat containing umbilical hernia. No bowel containing hernia. No free air. No abdominopelvic ascites. Musculoskeletal: No acute osseous abnormality. No suspicious lytic or blastic lesions. Multilevel degenerative changes of the spine. IMPRESSION: Acute uncomplicated sigmoid diverticulitis. No drainable fluid collection. If not recently performed, recommend colonoscopy after treatment to rule out underlying neoplasm. Electronically Signed   By: Maurine Simmering M.D.   On: 08/24/2021 13:41    Procedures Procedures   Medications Ordered in ED Medications  HYDROcodone-acetaminophen (NORCO/VICODIN) 5-325 MG per tablet 1 tablet (1 tablet Oral Given 08/24/21 1304)  iohexol (OMNIPAQUE) 300 MG/ML solution 85 mL (85 mLs Intravenous Contrast Given 08/24/21 1318)    ED Course  I have reviewed the triage vital signs and the nursing notes.  Pertinent labs & imaging results that were available during my care of the patient were reviewed by me and considered in my medical decision making (see chart for details).    MDM Rules/Calculators/A&P                          Amanda Fry is a 70 y.o. female who presents to the emergency department for for evaluation of lower abdominal pain.  Initial work-up was ordered in triage to include CBC, CMP, lipase, and urinalysis.  These are all reassuring.  There is no  leukocytosis, electrolyte derangements, evidence of acute hepatobiliary pathology, or pancreatitis.  Given that the patient's symptoms are worsening and history of abdominal surgeries I will obtain a CT abdomen with contrast for further evaluation.  I am concerned for possible diverticulitis. I will also give the patient a Norco for pain control.  Patient is in agreement with this plan.  CT abdomen with contrast revealed uncomplicated acute diverticulitis.  This is likely the source of her symptoms.  Given the clinical scenario, I will discharge her with Cipro and Flagyl as she does not tolerate Augmentin.  Strict return precautions were given.  She is safe for discharge.   Final Clinical Impression(s) / ED Diagnoses Final diagnoses:  Diverticulitis    Rx / DC Orders ED Discharge Orders          Ordered    ciprofloxacin (CIPRO) 500 MG tablet  Every 12 hours        08/24/21 1454    metroNIDAZOLE (FLAGYL) 500 MG tablet  2 times daily        08/24/21 1454             Myna Bright [place], [place] 08/24/21 1459    Godfrey Pick, MD 08/25/21 1030

## 2021-08-24 NOTE — Discharge Instructions (Addendum)
You have diverticulitis which is inflammation of the colon.  I have placed you on 2 antibiotics ciprofloxacin and metronidazole.  Please take ciprofloxacin twice a day for the next 5 days and metronidazole twice per day for the next 7 days.  Tylenol for pain.  Please take medicines with food as they can irritate the stomach.  I would like for you to follow-up with your primary care doctor within the next week to ensure we are on the right direction.  Please return to the emergency room sooner if you experience worsening pain, intractable vomiting or diarrhea, fevers, or any other concerns you might have.

## 2021-08-25 DIAGNOSIS — Z1152 Encounter for screening for COVID-19: Secondary | ICD-10-CM | POA: Diagnosis not present

## 2021-09-07 DIAGNOSIS — I10 Essential (primary) hypertension: Secondary | ICD-10-CM | POA: Diagnosis not present

## 2021-09-07 DIAGNOSIS — Z20822 Contact with and (suspected) exposure to covid-19: Secondary | ICD-10-CM | POA: Diagnosis not present

## 2021-09-07 DIAGNOSIS — E782 Mixed hyperlipidemia: Secondary | ICD-10-CM | POA: Diagnosis not present

## 2021-09-07 DIAGNOSIS — G4700 Insomnia, unspecified: Secondary | ICD-10-CM | POA: Diagnosis not present

## 2021-09-07 DIAGNOSIS — E039 Hypothyroidism, unspecified: Secondary | ICD-10-CM | POA: Diagnosis not present

## 2021-09-24 DIAGNOSIS — Z1152 Encounter for screening for COVID-19: Secondary | ICD-10-CM | POA: Diagnosis not present

## 2021-10-11 ENCOUNTER — Other Ambulatory Visit: Payer: Self-pay | Admitting: Family Medicine

## 2021-10-12 ENCOUNTER — Telehealth: Payer: Self-pay | Admitting: Student

## 2021-10-12 MED ORDER — CLOPIDOGREL BISULFATE 75 MG PO TABS: 75.0000 mg | ORAL_TABLET | Freq: Every day | ORAL | 3 refills | Status: DC

## 2021-10-12 NOTE — Telephone Encounter (Signed)
Plavix refill e-prescribed to patient's [facility] in [place]. 75 mg tablets, one tablet by mouth daily x 30 days with three refills. Patient scheduled for follow up imaging November 18, 2021.  Soyla Dryer, [place] [phone] 10/12/2021, 12:11 PM

## 2021-11-14 DIAGNOSIS — Z20822 Contact with and (suspected) exposure to covid-19: Secondary | ICD-10-CM | POA: Diagnosis not present

## 2021-11-18 ENCOUNTER — Ambulatory Visit (HOSPITAL_BASED_OUTPATIENT_CLINIC_OR_DEPARTMENT_OTHER)
Admission: RE | Admit: 2021-11-18 | Discharge: 2021-11-18 | Disposition: A | Discharge status: Home / Self Care | Payer: Medicare Other | Source: Ambulatory Visit | Attending: Interventional Radiology | Admitting: Interventional Radiology

## 2021-11-18 ENCOUNTER — Ambulatory Visit (HOSPITAL_COMMUNITY)
Admission: RE | Admit: 2021-11-18 | Discharge: 2021-11-18 | Disposition: A | Discharge status: Home / Self Care | Payer: Medicare Other | Source: Ambulatory Visit | Attending: Interventional Radiology | Admitting: Interventional Radiology

## 2021-11-18 ENCOUNTER — Other Ambulatory Visit: Payer: Self-pay

## 2021-11-18 DIAGNOSIS — I771 Stricture of artery: Secondary | ICD-10-CM | POA: Diagnosis not present

## 2021-11-18 DIAGNOSIS — I671 Cerebral aneurysm, nonruptured: Secondary | ICD-10-CM | POA: Diagnosis not present

## 2021-11-18 NOTE — Progress Notes (Signed)
Carotid duplex has been completed.   Preliminary results in CV Proc.   Amanda Fry 11/18/2021 1:05 PM

## 2021-11-19 DIAGNOSIS — Z20822 Contact with and (suspected) exposure to covid-19: Secondary | ICD-10-CM | POA: Diagnosis not present

## 2021-11-22 ENCOUNTER — Telehealth (HOSPITAL_COMMUNITY): Payer: Self-pay

## 2021-11-22 DIAGNOSIS — Z20822 Contact with and (suspected) exposure to covid-19: Secondary | ICD-10-CM | POA: Diagnosis not present

## 2021-11-22 NOTE — Telephone Encounter (Signed)
Pt agreed to f/u in 6 months with a us carotid. AW 

## 2021-11-22 NOTE — Telephone Encounter (Signed)
Called pt regarding recent imaging, no answer, left vm. AW  

## 2021-12-10 DIAGNOSIS — Z20828 Contact with and (suspected) exposure to other viral communicable diseases: Secondary | ICD-10-CM | POA: Diagnosis not present

## 2021-12-11 DIAGNOSIS — Z20822 Contact with and (suspected) exposure to covid-19: Secondary | ICD-10-CM | POA: Diagnosis not present

## 2021-12-16 DIAGNOSIS — G4733 Obstructive sleep apnea (adult) (pediatric): Secondary | ICD-10-CM | POA: Diagnosis not present

## 2021-12-29 DIAGNOSIS — Z20822 Contact with and (suspected) exposure to covid-19: Secondary | ICD-10-CM | POA: Diagnosis not present

## 2022-01-03 DIAGNOSIS — M2550 Pain in unspecified joint: Secondary | ICD-10-CM | POA: Insufficient documentation

## 2022-01-04 DIAGNOSIS — R2241 Localized swelling, mass and lump, right lower limb: Secondary | ICD-10-CM | POA: Insufficient documentation

## 2022-01-04 DIAGNOSIS — M2550 Pain in unspecified joint: Secondary | ICD-10-CM | POA: Diagnosis not present

## 2022-01-04 DIAGNOSIS — M79604 Pain in right leg: Secondary | ICD-10-CM | POA: Diagnosis not present

## 2022-01-06 DIAGNOSIS — Z20828 Contact with and (suspected) exposure to other viral communicable diseases: Secondary | ICD-10-CM | POA: Diagnosis not present

## 2022-01-17 DIAGNOSIS — Z20822 Contact with and (suspected) exposure to covid-19: Secondary | ICD-10-CM | POA: Diagnosis not present

## 2022-01-20 DIAGNOSIS — M79604 Pain in right leg: Secondary | ICD-10-CM | POA: Diagnosis not present

## 2022-01-20 DIAGNOSIS — M2550 Pain in unspecified joint: Secondary | ICD-10-CM | POA: Diagnosis not present

## 2022-01-21 DIAGNOSIS — Z20822 Contact with and (suspected) exposure to covid-19: Secondary | ICD-10-CM | POA: Diagnosis not present

## 2022-01-27 DIAGNOSIS — R2241 Localized swelling, mass and lump, right lower limb: Secondary | ICD-10-CM | POA: Diagnosis not present

## 2022-01-27 DIAGNOSIS — M79661 Pain in right lower leg: Secondary | ICD-10-CM | POA: Insufficient documentation

## 2022-02-03 DIAGNOSIS — R051 Acute cough: Secondary | ICD-10-CM | POA: Diagnosis not present

## 2022-02-03 DIAGNOSIS — R059 Cough, unspecified: Secondary | ICD-10-CM | POA: Diagnosis not present

## 2022-02-03 DIAGNOSIS — Z20822 Contact with and (suspected) exposure to covid-19: Secondary | ICD-10-CM | POA: Diagnosis not present

## 2022-02-08 DIAGNOSIS — M79661 Pain in right lower leg: Secondary | ICD-10-CM | POA: Diagnosis not present

## 2022-02-15 DIAGNOSIS — M79661 Pain in right lower leg: Secondary | ICD-10-CM | POA: Diagnosis not present

## 2022-02-15 DIAGNOSIS — M2520 Flail joint, unspecified joint: Secondary | ICD-10-CM | POA: Diagnosis not present

## 2022-02-15 DIAGNOSIS — I83811 Varicose veins of right lower extremities with pain: Secondary | ICD-10-CM | POA: Diagnosis not present

## 2022-02-15 DIAGNOSIS — I10 Essential (primary) hypertension: Secondary | ICD-10-CM | POA: Diagnosis not present

## 2022-02-15 DIAGNOSIS — M7989 Other specified soft tissue disorders: Secondary | ICD-10-CM | POA: Diagnosis not present

## 2022-02-17 ENCOUNTER — Other Ambulatory Visit: Payer: Self-pay | Admitting: Radiology

## 2022-02-17 ENCOUNTER — Telehealth (HOSPITAL_COMMUNITY): Payer: Self-pay

## 2022-02-17 MED ORDER — CLOPIDOGREL BISULFATE 75 MG PO TABS: 75.0000 mg | ORAL_TABLET | Freq: Every day | ORAL | 5 refills | Status: DC

## 2022-02-17 NOTE — Telephone Encounter (Signed)
Pt called for Plavix refill. Sent a message to KB to call in. AW

## 2022-02-18 DIAGNOSIS — M79661 Pain in right lower leg: Secondary | ICD-10-CM | POA: Diagnosis not present

## 2022-02-18 DIAGNOSIS — R2241 Localized swelling, mass and lump, right lower limb: Secondary | ICD-10-CM | POA: Diagnosis not present

## 2022-03-10 DIAGNOSIS — Z1331 Encounter for screening for depression: Secondary | ICD-10-CM | POA: Diagnosis not present

## 2022-03-10 DIAGNOSIS — Z0000 Encounter for general adult medical examination without abnormal findings: Secondary | ICD-10-CM | POA: Diagnosis not present

## 2022-03-10 DIAGNOSIS — I10 Essential (primary) hypertension: Secondary | ICD-10-CM | POA: Diagnosis not present

## 2022-03-10 DIAGNOSIS — E782 Mixed hyperlipidemia: Secondary | ICD-10-CM | POA: Diagnosis not present

## 2022-03-10 DIAGNOSIS — G4700 Insomnia, unspecified: Secondary | ICD-10-CM | POA: Diagnosis not present

## 2022-03-10 DIAGNOSIS — R202 Paresthesia of skin: Secondary | ICD-10-CM | POA: Diagnosis not present

## 2022-03-10 DIAGNOSIS — I671 Cerebral aneurysm, nonruptured: Secondary | ICD-10-CM | POA: Diagnosis not present

## 2022-03-10 DIAGNOSIS — E039 Hypothyroidism, unspecified: Secondary | ICD-10-CM | POA: Diagnosis not present

## 2022-03-15 DIAGNOSIS — I83891 Varicose veins of right lower extremities with other complications: Secondary | ICD-10-CM | POA: Diagnosis not present

## 2022-03-15 DIAGNOSIS — M2520 Flail joint, unspecified joint: Secondary | ICD-10-CM | POA: Diagnosis not present

## 2022-03-15 DIAGNOSIS — I10 Essential (primary) hypertension: Secondary | ICD-10-CM | POA: Diagnosis not present

## 2022-03-18 DIAGNOSIS — M79661 Pain in right lower leg: Secondary | ICD-10-CM | POA: Diagnosis not present

## 2022-04-07 DIAGNOSIS — M7989 Other specified soft tissue disorders: Secondary | ICD-10-CM | POA: Diagnosis not present

## 2022-04-07 DIAGNOSIS — I83891 Varicose veins of right lower extremities with other complications: Secondary | ICD-10-CM | POA: Diagnosis not present

## 2022-04-07 DIAGNOSIS — I83811 Varicose veins of right lower extremities with pain: Secondary | ICD-10-CM | POA: Diagnosis not present

## 2022-04-26 DIAGNOSIS — I83891 Varicose veins of right lower extremities with other complications: Secondary | ICD-10-CM | POA: Diagnosis not present

## 2022-04-26 DIAGNOSIS — I87391 Chronic venous hypertension (idiopathic) with other complications of right lower extremity: Secondary | ICD-10-CM | POA: Diagnosis not present

## 2022-05-12 DIAGNOSIS — M7989 Other specified soft tissue disorders: Secondary | ICD-10-CM | POA: Diagnosis not present

## 2022-05-12 DIAGNOSIS — I83811 Varicose veins of right lower extremities with pain: Secondary | ICD-10-CM | POA: Diagnosis not present

## 2022-05-12 DIAGNOSIS — I83891 Varicose veins of right lower extremities with other complications: Secondary | ICD-10-CM | POA: Diagnosis not present

## 2022-05-26 ENCOUNTER — Telehealth (HOSPITAL_COMMUNITY): Payer: Self-pay

## 2022-05-26 DIAGNOSIS — I83891 Varicose veins of right lower extremities with other complications: Secondary | ICD-10-CM | POA: Diagnosis not present

## 2022-05-26 DIAGNOSIS — I8001 Phlebitis and thrombophlebitis of superficial vessels of right lower extremity: Secondary | ICD-10-CM | POA: Diagnosis not present

## 2022-05-26 NOTE — Telephone Encounter (Signed)
Called to schedule us carotid, no answer, left vm. AW  

## 2022-06-01 ENCOUNTER — Telehealth (HOSPITAL_COMMUNITY): Payer: Self-pay

## 2022-06-01 ENCOUNTER — Other Ambulatory Visit (HOSPITAL_COMMUNITY): Payer: Self-pay | Admitting: Interventional Radiology

## 2022-06-01 DIAGNOSIS — I771 Stricture of artery: Secondary | ICD-10-CM

## 2022-06-01 NOTE — Telephone Encounter (Signed)
Called to schedule carotid US, no answer, left vm. AW

## 2022-06-09 ENCOUNTER — Ambulatory Visit (HOSPITAL_COMMUNITY)
Admission: RE | Admit: 2022-06-09 | Discharge: 2022-06-09 | Disposition: A | Discharge status: Home / Self Care | Payer: Medicare Other | Source: Ambulatory Visit | Attending: Interventional Radiology | Admitting: Interventional Radiology

## 2022-06-09 DIAGNOSIS — I771 Stricture of artery: Secondary | ICD-10-CM | POA: Diagnosis not present

## 2022-06-09 NOTE — Progress Notes (Signed)
Carotid duplex has been completed.   Preliminary results in CV Proc.   Jinny Blossom Mychal Decarlo 06/09/2022 1:09 PM

## 2022-06-10 DIAGNOSIS — I83891 Varicose veins of right lower extremities with other complications: Secondary | ICD-10-CM | POA: Diagnosis not present

## 2022-06-16 ENCOUNTER — Telehealth (HOSPITAL_COMMUNITY): Payer: Self-pay

## 2022-06-16 DIAGNOSIS — Z23 Encounter for immunization: Secondary | ICD-10-CM | POA: Diagnosis not present

## 2022-06-16 NOTE — Telephone Encounter (Signed)
Called pt regarding recent imaging, no answer, left vm. AW  

## 2022-06-22 ENCOUNTER — Telehealth (HOSPITAL_COMMUNITY): Payer: Self-pay

## 2022-06-22 NOTE — Telephone Encounter (Signed)
Left vm for pt to f/u in 6 months with Dr. Estanislado Pandy, call with questions. AW

## 2022-07-07 ENCOUNTER — Other Ambulatory Visit: Payer: Self-pay | Admitting: Radiology

## 2022-07-07 ENCOUNTER — Telehealth (HOSPITAL_COMMUNITY): Payer: Self-pay

## 2022-07-07 MED ORDER — CLOPIDOGREL BISULFATE 75 MG PO TABS: 75.0000 mg | ORAL_TABLET | Freq: Every day | ORAL | 5 refills | Status: DC

## 2022-07-07 NOTE — Telephone Encounter (Signed)
Called to get a refill on her Plavix. I have sent a message to our PA to call this in. AW

## 2022-08-22 DIAGNOSIS — Z1231 Encounter for screening mammogram for malignant neoplasm of breast: Secondary | ICD-10-CM | POA: Diagnosis not present

## 2022-09-05 ENCOUNTER — Other Ambulatory Visit: Payer: Self-pay | Admitting: Family Medicine

## 2022-09-07 DIAGNOSIS — M797 Fibromyalgia: Secondary | ICD-10-CM | POA: Diagnosis not present

## 2022-09-07 DIAGNOSIS — G4733 Obstructive sleep apnea (adult) (pediatric): Secondary | ICD-10-CM | POA: Diagnosis not present

## 2022-09-07 DIAGNOSIS — E039 Hypothyroidism, unspecified: Secondary | ICD-10-CM | POA: Diagnosis not present

## 2022-09-07 DIAGNOSIS — E669 Obesity, unspecified: Secondary | ICD-10-CM | POA: Diagnosis not present

## 2022-09-07 DIAGNOSIS — I10 Essential (primary) hypertension: Secondary | ICD-10-CM | POA: Diagnosis not present

## 2022-09-07 DIAGNOSIS — I671 Cerebral aneurysm, nonruptured: Secondary | ICD-10-CM | POA: Diagnosis not present

## 2022-09-07 DIAGNOSIS — E782 Mixed hyperlipidemia: Secondary | ICD-10-CM | POA: Diagnosis not present

## 2022-09-07 DIAGNOSIS — G4700 Insomnia, unspecified: Secondary | ICD-10-CM | POA: Diagnosis not present

## 2022-09-13 DIAGNOSIS — I872 Venous insufficiency (chronic) (peripheral): Secondary | ICD-10-CM | POA: Diagnosis not present

## 2022-09-13 DIAGNOSIS — I83891 Varicose veins of right lower extremities with other complications: Secondary | ICD-10-CM | POA: Diagnosis not present

## 2022-10-26 DIAGNOSIS — E65 Localized adiposity: Secondary | ICD-10-CM | POA: Diagnosis not present

## 2022-10-26 DIAGNOSIS — R7303 Prediabetes: Secondary | ICD-10-CM | POA: Diagnosis not present

## 2022-10-26 DIAGNOSIS — E88819 Insulin resistance, unspecified: Secondary | ICD-10-CM | POA: Diagnosis not present

## 2022-10-26 DIAGNOSIS — I10 Essential (primary) hypertension: Secondary | ICD-10-CM | POA: Diagnosis not present

## 2022-10-26 DIAGNOSIS — E782 Mixed hyperlipidemia: Secondary | ICD-10-CM | POA: Diagnosis not present

## 2022-10-26 DIAGNOSIS — Z1331 Encounter for screening for depression: Secondary | ICD-10-CM | POA: Diagnosis not present

## 2022-10-26 DIAGNOSIS — G4700 Insomnia, unspecified: Secondary | ICD-10-CM | POA: Diagnosis not present

## 2022-10-26 DIAGNOSIS — Z79899 Other long term (current) drug therapy: Secondary | ICD-10-CM | POA: Diagnosis not present

## 2022-10-26 DIAGNOSIS — Z6836 Body mass index (BMI) 36.0-36.9, adult: Secondary | ICD-10-CM | POA: Diagnosis not present

## 2022-10-26 DIAGNOSIS — E039 Hypothyroidism, unspecified: Secondary | ICD-10-CM | POA: Diagnosis not present

## 2022-10-26 DIAGNOSIS — E538 Deficiency of other specified B group vitamins: Secondary | ICD-10-CM | POA: Diagnosis not present

## 2022-10-26 DIAGNOSIS — E8889 Other specified metabolic disorders: Secondary | ICD-10-CM | POA: Diagnosis not present

## 2022-11-09 DIAGNOSIS — R7303 Prediabetes: Secondary | ICD-10-CM | POA: Diagnosis not present

## 2022-11-09 DIAGNOSIS — Z6836 Body mass index (BMI) 36.0-36.9, adult: Secondary | ICD-10-CM | POA: Diagnosis not present

## 2022-11-09 DIAGNOSIS — E88819 Insulin resistance, unspecified: Secondary | ICD-10-CM | POA: Diagnosis not present

## 2022-11-23 DIAGNOSIS — R7303 Prediabetes: Secondary | ICD-10-CM | POA: Diagnosis not present

## 2022-11-23 DIAGNOSIS — Z6835 Body mass index (BMI) 35.0-35.9, adult: Secondary | ICD-10-CM | POA: Diagnosis not present

## 2022-12-07 DIAGNOSIS — R7303 Prediabetes: Secondary | ICD-10-CM | POA: Diagnosis not present

## 2022-12-07 DIAGNOSIS — Z6835 Body mass index (BMI) 35.0-35.9, adult: Secondary | ICD-10-CM | POA: Diagnosis not present

## 2022-12-11 ENCOUNTER — Emergency Department (HOSPITAL_BASED_OUTPATIENT_CLINIC_OR_DEPARTMENT_OTHER): Payer: Medicare Other

## 2022-12-11 ENCOUNTER — Encounter (HOSPITAL_BASED_OUTPATIENT_CLINIC_OR_DEPARTMENT_OTHER): Payer: Self-pay

## 2022-12-11 ENCOUNTER — Inpatient Hospital Stay (HOSPITAL_BASED_OUTPATIENT_CLINIC_OR_DEPARTMENT_OTHER)
Admission: EM | Admit: 2022-12-11 | Discharge: 2022-12-13 | DRG: 392 | Disposition: A | Discharge status: Home / Self Care | Payer: Medicare Other | Attending: Family Medicine | Admitting: Family Medicine

## 2022-12-11 ENCOUNTER — Other Ambulatory Visit: Payer: Self-pay

## 2022-12-11 DIAGNOSIS — Z888 Allergy status to other drugs, medicaments and biological substances status: Secondary | ICD-10-CM

## 2022-12-11 DIAGNOSIS — Z90711 Acquired absence of uterus with remaining cervical stump: Secondary | ICD-10-CM

## 2022-12-11 DIAGNOSIS — Z825 Family history of asthma and other chronic lower respiratory diseases: Secondary | ICD-10-CM | POA: Diagnosis not present

## 2022-12-11 DIAGNOSIS — Z6837 Body mass index (BMI) 37.0-37.9, adult: Secondary | ICD-10-CM

## 2022-12-11 DIAGNOSIS — I6529 Occlusion and stenosis of unspecified carotid artery: Secondary | ICD-10-CM | POA: Diagnosis present

## 2022-12-11 DIAGNOSIS — Z881 Allergy status to other antibiotic agents status: Secondary | ICD-10-CM | POA: Diagnosis not present

## 2022-12-11 DIAGNOSIS — G250 Essential tremor: Secondary | ICD-10-CM | POA: Diagnosis present

## 2022-12-11 DIAGNOSIS — Z7902 Long term (current) use of antithrombotics/antiplatelets: Secondary | ICD-10-CM

## 2022-12-11 DIAGNOSIS — R1032 Left lower quadrant pain: Secondary | ICD-10-CM | POA: Diagnosis not present

## 2022-12-11 DIAGNOSIS — E669 Obesity, unspecified: Secondary | ICD-10-CM | POA: Diagnosis present

## 2022-12-11 DIAGNOSIS — K5780 Diverticulitis of intestine, part unspecified, with perforation and abscess without bleeding: Secondary | ICD-10-CM | POA: Diagnosis present

## 2022-12-11 DIAGNOSIS — Z79899 Other long term (current) drug therapy: Secondary | ICD-10-CM

## 2022-12-11 DIAGNOSIS — M797 Fibromyalgia: Secondary | ICD-10-CM | POA: Diagnosis present

## 2022-12-11 DIAGNOSIS — Z833 Family history of diabetes mellitus: Secondary | ICD-10-CM

## 2022-12-11 DIAGNOSIS — Z7982 Long term (current) use of aspirin: Secondary | ICD-10-CM

## 2022-12-11 DIAGNOSIS — Z87891 Personal history of nicotine dependence: Secondary | ICD-10-CM

## 2022-12-11 DIAGNOSIS — K5732 Diverticulitis of large intestine without perforation or abscess without bleeding: Secondary | ICD-10-CM | POA: Diagnosis present

## 2022-12-11 DIAGNOSIS — I739 Peripheral vascular disease, unspecified: Secondary | ICD-10-CM | POA: Diagnosis present

## 2022-12-11 DIAGNOSIS — Z91013 Allergy to seafood: Secondary | ICD-10-CM

## 2022-12-11 DIAGNOSIS — E785 Hyperlipidemia, unspecified: Secondary | ICD-10-CM | POA: Insufficient documentation

## 2022-12-11 DIAGNOSIS — K5730 Diverticulosis of large intestine without perforation or abscess without bleeding: Secondary | ICD-10-CM | POA: Diagnosis not present

## 2022-12-11 DIAGNOSIS — Z8249 Family history of ischemic heart disease and other diseases of the circulatory system: Secondary | ICD-10-CM

## 2022-12-11 DIAGNOSIS — R7303 Prediabetes: Secondary | ICD-10-CM | POA: Diagnosis present

## 2022-12-11 DIAGNOSIS — K5792 Diverticulitis of intestine, part unspecified, without perforation or abscess without bleeding: Secondary | ICD-10-CM | POA: Diagnosis not present

## 2022-12-11 DIAGNOSIS — K219 Gastro-esophageal reflux disease without esophagitis: Secondary | ICD-10-CM | POA: Diagnosis present

## 2022-12-11 DIAGNOSIS — F419 Anxiety disorder, unspecified: Secondary | ICD-10-CM | POA: Diagnosis present

## 2022-12-11 DIAGNOSIS — Z79890 Hormone replacement therapy (postmenopausal): Secondary | ICD-10-CM | POA: Diagnosis not present

## 2022-12-11 DIAGNOSIS — Z1152 Encounter for screening for COVID-19: Secondary | ICD-10-CM

## 2022-12-11 DIAGNOSIS — G2581 Restless legs syndrome: Secondary | ICD-10-CM | POA: Diagnosis present

## 2022-12-11 DIAGNOSIS — E039 Hypothyroidism, unspecified: Secondary | ICD-10-CM | POA: Diagnosis present

## 2022-12-11 DIAGNOSIS — K5720 Diverticulitis of large intestine with perforation and abscess without bleeding: Principal | ICD-10-CM | POA: Diagnosis present

## 2022-12-11 DIAGNOSIS — N281 Cyst of kidney, acquired: Secondary | ICD-10-CM | POA: Diagnosis not present

## 2022-12-11 DIAGNOSIS — I10 Essential (primary) hypertension: Secondary | ICD-10-CM | POA: Diagnosis present

## 2022-12-11 HISTORY — DX: Diverticulitis of intestine, part unspecified, without perforation or abscess without bleeding: K57.92

## 2022-12-11 LAB — URINALYSIS, ROUTINE W REFLEX MICROSCOPIC
Bilirubin Urine: NEGATIVE
Glucose, UA: NEGATIVE mg/dL
Hgb urine dipstick: NEGATIVE
Ketones, ur: NEGATIVE mg/dL
Leukocytes,Ua: NEGATIVE
Nitrite: NEGATIVE
Protein, ur: NEGATIVE mg/dL
Specific Gravity, Urine: 1.009 (ref 1.005–1.030)
pH: 7 (ref 5.0–8.0)

## 2022-12-11 LAB — CBC WITH DIFFERENTIAL/PLATELET
Abs Immature Granulocytes: 0.01 10*3/uL (ref 0.00–0.07)
Basophils Absolute: 0 10*3/uL (ref 0.0–0.1)
Basophils Relative: 0 %
Eosinophils Absolute: 0.2 10*3/uL (ref 0.0–0.5)
Eosinophils Relative: 3 %
HCT: 40.4 % (ref 36.0–46.0)
Hemoglobin: 13.5 g/dL (ref 12.0–15.0)
Immature Granulocytes: 0 %
Lymphocytes Relative: 19 %
Lymphs Abs: 1.4 10*3/uL (ref 0.7–4.0)
MCH: 31.7 pg (ref 26.0–34.0)
MCHC: 33.4 g/dL (ref 30.0–36.0)
MCV: 94.8 fL (ref 80.0–100.0)
Monocytes Absolute: 0.8 10*3/uL (ref 0.1–1.0)
Monocytes Relative: 10 %
Neutro Abs: 5.2 10*3/uL (ref 1.7–7.7)
Neutrophils Relative %: 68 %
Platelets: 191 10*3/uL (ref 150–400)
RBC: 4.26 MIL/uL (ref 3.87–5.11)
RDW: 12.5 % (ref 11.5–15.5)
WBC: 7.7 10*3/uL (ref 4.0–10.5)
nRBC: 0 % (ref 0.0–0.2)

## 2022-12-11 LAB — CBC
HCT: 40.8 % (ref 36.0–46.0)
Hemoglobin: 13.2 g/dL (ref 12.0–15.0)
MCH: 31.5 pg (ref 26.0–34.0)
MCHC: 32.4 g/dL (ref 30.0–36.0)
MCV: 97.4 fL (ref 80.0–100.0)
Platelets: 177 10*3/uL (ref 150–400)
RBC: 4.19 MIL/uL (ref 3.87–5.11)
RDW: 12.4 % (ref 11.5–15.5)
WBC: 6 10*3/uL (ref 4.0–10.5)
nRBC: 0 % (ref 0.0–0.2)

## 2022-12-11 LAB — BASIC METABOLIC PANEL
Anion gap: 8 (ref 5–15)
BUN: 16 mg/dL (ref 8–23)
CO2: 26 mmol/L (ref 22–32)
Calcium: 9.7 mg/dL (ref 8.9–10.3)
Chloride: 104 mmol/L (ref 98–111)
Creatinine, Ser: 0.98 mg/dL (ref 0.44–1.00)
GFR, Estimated: >60 mL/min (ref >60)
Glucose, Bld: 108 mg/dL — ABNORMAL HIGH (ref 70–99)
Potassium: 4.1 mmol/L (ref 3.5–5.1)
Sodium: 138 mmol/L (ref 135–145)

## 2022-12-11 LAB — CREATININE, SERUM
Creatinine, Ser: 1.05 mg/dL — ABNORMAL HIGH (ref 0.44–1.00)
GFR, Estimated: 57 mL/min — ABNORMAL LOW (ref >60)

## 2022-12-11 LAB — LACTIC ACID, PLASMA
Lactic Acid, Venous: 0.7 mmol/L (ref 0.5–1.9)
Lactic Acid, Venous: 0.8 mmol/L (ref 0.5–1.9)

## 2022-12-11 MED ORDER — PIPERACILLIN-TAZOBACTAM 3.375 G IVPB 30 MIN
3.3750 g | Freq: Once | INTRAVENOUS | Status: AC
  Administered 2022-12-11: 3.375 g via INTRAVENOUS
  Filled 2022-12-11: qty 50

## 2022-12-11 MED ORDER — CALCIUM CARBONATE ANTACID 500 MG PO CHEW
1.0000 | CHEWABLE_TABLET | Freq: Every day | ORAL | Status: DC
  Administered 2022-12-11 – 2022-12-13 (×3): 200 mg via ORAL
  Filled 2022-12-11 (×3): qty 1

## 2022-12-11 MED ORDER — ONDANSETRON HCL 4 MG PO TABS
4.0000 mg | ORAL_TABLET | Freq: Four times a day (QID) | ORAL | Status: DC | PRN
  Administered 2022-12-13: 4 mg via ORAL
  Filled 2022-12-11: qty 1

## 2022-12-11 MED ORDER — MORPHINE SULFATE (PF) 4 MG/ML IV SOLN
4.0000 mg | Freq: Once | INTRAVENOUS | Status: AC
Start: 2022-12-11 — End: 2022-12-11
  Administered 2022-12-11: 4 mg via INTRAVENOUS
  Filled 2022-12-11: qty 1

## 2022-12-11 MED ORDER — CLOPIDOGREL BISULFATE 75 MG PO TABS
75.0000 mg | ORAL_TABLET | Freq: Every day | ORAL | Status: DC
  Administered 2022-12-11 – 2022-12-12 (×2): 75 mg via ORAL
  Filled 2022-12-11 (×3): qty 1

## 2022-12-11 MED ORDER — SODIUM CHLORIDE 0.9 % IV SOLN: INTRAVENOUS | Status: DC

## 2022-12-11 MED ORDER — MORPHINE SULFATE (PF) 4 MG/ML IV SOLN
6.0000 mg | Freq: Once | INTRAVENOUS | Status: AC
Start: 2022-12-11 — End: 2022-12-11
  Administered 2022-12-11: 6 mg via INTRAVENOUS
  Filled 2022-12-11: qty 2

## 2022-12-11 MED ORDER — MAGNESIUM CITRATE PO SOLN: 1.0000 | Freq: Once | ORAL | Status: DC | PRN

## 2022-12-11 MED ORDER — CALCIUM CARBONATE ANTACID 750 MG PO CHEW: 750.0000 mg | CHEWABLE_TABLET | Freq: Every day | ORAL | Status: DC

## 2022-12-11 MED ORDER — CALCIUM CARB-CHOLECALCIFEROL 600-800 MG-UNIT PO TABS: 1.0000 | ORAL_TABLET | Freq: Every day | ORAL | Status: DC

## 2022-12-11 MED ORDER — ENOXAPARIN SODIUM 40 MG/0.4ML IJ SOSY
40.0000 mg | PREFILLED_SYRINGE | INTRAMUSCULAR | Status: DC
  Administered 2022-12-11 – 2022-12-12 (×2): 40 mg via SUBCUTANEOUS
  Filled 2022-12-11 (×2): qty 0.4

## 2022-12-11 MED ORDER — CIPROFLOXACIN IN D5W 400 MG/200ML IV SOLN
400.0000 mg | Freq: Two times a day (BID) | INTRAVENOUS | Status: DC
  Administered 2022-12-11 – 2022-12-13 (×4): 400 mg via INTRAVENOUS
  Filled 2022-12-11 (×4): qty 200

## 2022-12-11 MED ORDER — LEVOTHYROXINE SODIUM 112 MCG PO TABS
112.0000 ug | ORAL_TABLET | Freq: Every day | ORAL | Status: DC
  Administered 2022-12-12 – 2022-12-13 (×2): 112 ug via ORAL
  Filled 2022-12-11 (×2): qty 1

## 2022-12-11 MED ORDER — OXYCODONE HCL 5 MG PO TABS
5.0000 mg | ORAL_TABLET | ORAL | Status: DC | PRN
  Administered 2022-12-11: 5 mg via ORAL
  Filled 2022-12-11: qty 1

## 2022-12-11 MED ORDER — ROPINIROLE HCL 0.5 MG PO TABS
0.2500 mg | ORAL_TABLET | Freq: Every day | ORAL | Status: DC
  Administered 2022-12-11 – 2022-12-12 (×2): 0.25 mg via ORAL
  Filled 2022-12-11 (×2): qty 1

## 2022-12-11 MED ORDER — VITAMIN D 25 MCG (1000 UNIT) PO TABS
1000.0000 [IU] | ORAL_TABLET | Freq: Two times a day (BID) | ORAL | Status: DC
  Administered 2022-12-11 – 2022-12-13 (×4): 1000 [IU] via ORAL
  Filled 2022-12-11 (×4): qty 1

## 2022-12-11 MED ORDER — ATORVASTATIN CALCIUM 20 MG PO TABS
40.0000 mg | ORAL_TABLET | Freq: Every evening | ORAL | Status: DC
  Administered 2022-12-11 – 2022-12-12 (×2): 40 mg via ORAL
  Filled 2022-12-11 (×2): qty 2

## 2022-12-11 MED ORDER — HYDROMORPHONE HCL 1 MG/ML IJ SOLN
0.5000 mg | INTRAMUSCULAR | Status: DC | PRN
  Administered 2022-12-11: 1 mg via INTRAVENOUS
  Filled 2022-12-11: qty 1

## 2022-12-11 MED ORDER — ZOLPIDEM TARTRATE 5 MG PO TABS
5.0000 mg | ORAL_TABLET | Freq: Every day | ORAL | Status: DC
  Administered 2022-12-11 – 2022-12-12 (×2): 5 mg via ORAL
  Filled 2022-12-11 (×2): qty 1

## 2022-12-11 MED ORDER — ROPINIROLE HCL 0.5 MG PO TABS: 0.2500 mg | ORAL_TABLET | Freq: Every day | ORAL | Status: DC

## 2022-12-11 MED ORDER — HYDROMORPHONE HCL 1 MG/ML IJ SOLN
0.5000 mg | Freq: Once | INTRAMUSCULAR | Status: AC
  Administered 2022-12-11: 0.5 mg via INTRAVENOUS
  Filled 2022-12-11: qty 1

## 2022-12-11 MED ORDER — ACETAMINOPHEN 650 MG RE SUPP: 650.0000 mg | Freq: Four times a day (QID) | RECTAL | Status: DC | PRN

## 2022-12-11 MED ORDER — POLYETHYLENE GLYCOL 3350 17 G PO PACK: 17.0000 g | PACK | Freq: Every day | ORAL | Status: DC | PRN

## 2022-12-11 MED ORDER — DOCUSATE SODIUM 100 MG PO CAPS
100.0000 mg | ORAL_CAPSULE | Freq: Two times a day (BID) | ORAL | Status: DC
  Administered 2022-12-11 – 2022-12-13 (×4): 100 mg via ORAL
  Filled 2022-12-11 (×4): qty 1

## 2022-12-11 MED ORDER — ACETAMINOPHEN 325 MG PO TABS
650.0000 mg | ORAL_TABLET | Freq: Four times a day (QID) | ORAL | Status: DC | PRN
  Administered 2022-12-12: 650 mg via ORAL
  Filled 2022-12-11: qty 2

## 2022-12-11 MED ORDER — SORBITOL 70 % SOLN: 30.0000 mL | Freq: Every day | Status: DC | PRN

## 2022-12-11 MED ORDER — ONDANSETRON HCL 4 MG/2ML IJ SOLN
4.0000 mg | Freq: Four times a day (QID) | INTRAMUSCULAR | Status: DC | PRN
  Administered 2022-12-12: 4 mg via INTRAVENOUS
  Filled 2022-12-11: qty 2

## 2022-12-11 MED ORDER — PROPRANOLOL HCL ER 80 MG PO CP24
80.0000 mg | ORAL_CAPSULE | Freq: Every day | ORAL | Status: DC
  Administered 2022-12-12 – 2022-12-13 (×2): 80 mg via ORAL
  Filled 2022-12-11 (×2): qty 1

## 2022-12-11 MED ORDER — LORATADINE 10 MG PO TABS
10.0000 mg | ORAL_TABLET | Freq: Every day | ORAL | Status: DC
  Administered 2022-12-11 – 2022-12-13 (×3): 10 mg via ORAL
  Filled 2022-12-11 (×3): qty 1

## 2022-12-11 MED ORDER — LACTATED RINGERS IV SOLN: INTRAVENOUS | Status: DC

## 2022-12-11 MED ORDER — IOHEXOL 300 MG/ML  SOLN
100.0000 mL | Freq: Once | INTRAMUSCULAR | Status: AC | PRN
  Administered 2022-12-11: 100 mL via INTRAVENOUS

## 2022-12-11 MED ORDER — FAMOTIDINE 20 MG PO TABS
20.0000 mg | ORAL_TABLET | Freq: Every day | ORAL | Status: DC
  Administered 2022-12-11 – 2022-12-12 (×2): 20 mg via ORAL
  Filled 2022-12-11 (×2): qty 1

## 2022-12-11 MED ORDER — OYSTER SHELL CALCIUM/D3 500-5 MG-MCG PO TABS
1.0000 | ORAL_TABLET | Freq: Every day | ORAL | Status: DC
  Administered 2022-12-11 – 2022-12-12 (×2): 1 via ORAL
  Filled 2022-12-11 (×2): qty 1

## 2022-12-11 MED ORDER — METRONIDAZOLE 500 MG/100ML IV SOLN
500.0000 mg | Freq: Two times a day (BID) | INTRAVENOUS | Status: DC
  Administered 2022-12-11 – 2022-12-13 (×4): 500 mg via INTRAVENOUS
  Filled 2022-12-11 (×4): qty 100

## 2022-12-11 MED ORDER — ASPIRIN 325 MG PO TBEC
325.0000 mg | DELAYED_RELEASE_TABLET | Freq: Every day | ORAL | Status: DC
  Administered 2022-12-11 – 2022-12-12 (×2): 325 mg via ORAL
  Filled 2022-12-11 (×2): qty 1

## 2022-12-11 NOTE — ED Triage Notes (Signed)
She c/o llq area abd. Pain for past few days. She recognizes it as perhaps being a recurrence of diverticulitis. She is ambulatory and in no distress.

## 2022-12-11 NOTE — ED Provider Notes (Signed)
[facility] Provider Note   CSN: [id number] Arrival date & time: 12/11/22  1002     History  Chief Complaint  Patient presents with   Abdominal Pain    CASSIDEY VANFLEET is a 72 y.o. female.  72 year old female with history of diverticulitis presents with several days of left lower quadrant abdominal pain.  Pain has been persistent in nature and worse with movement.  No fever or chills.  No nausea or vomiting.  No bloody stools.  Pain is characterized as sharp.  No treatment use prior to arrival       Home Medications Prior to Admission medications   Medication Sig Start Date End Date Taking? Authorizing Provider  aspirin EC 325 MG tablet Take 325 mg by mouth at bedtime.    [provider]  atorvastatin (LIPITOR) 40 MG tablet Take 1 tablet (40 mg total) by mouth daily. Patient taking differently: Take 40 mg by mouth every evening. 08/15/17 05/21/21  Josue Hector, MD  AYR SALINE NASAL NA Place 1 spray into the nose daily as needed (congestion).    [provider]  Calcium Carb-Cholecalciferol 600-800 MG-UNIT TABS Take 1 tablet by mouth at bedtime.    [provider]  calcium carbonate (TUMS EX) 750 MG chewable tablet Chew 750 mg by mouth daily.    [provider]  cetirizine (ZYRTEC) 10 MG tablet Take 10 mg by mouth at bedtime.    [provider]  cholecalciferol (VITAMIN D) 1000 units tablet Take 1,000 Units by mouth 2 (two) times daily.    [provider]  ciprofloxacin (CIPRO) 500 MG tablet Take 1 tablet (500 mg total) by mouth every 12 (twelve) hours. 08/24/21   Hendricks Limes, PA-C  clopidogrel (PLAVIX) 75 MG tablet Take 1 tablet (75 mg total) by mouth daily. 07/07/22   Ascencion Dike, PA-C  famotidine (PEPCID) 20 MG tablet Take 20 mg by mouth at bedtime.    [provider]  Liniments (BLUE-EMU SUPER STRENGTH EX) Apply 1 application topically daily as needed (back  pain).    [provider]  Magnesium 500 MG CAPS Take 500 mg by mouth at bedtime.     [provider]  Melatonin 5 MG CAPS Take 5 mg by mouth at bedtime.    [provider]  Menthol, Topical Analgesic, (BIOFREEZE EX) Apply 1 application topically daily as needed (back pain).    [provider]  metroNIDAZOLE (FLAGYL) 500 MG tablet Take 1 tablet (500 mg total) by mouth 2 (two) times daily. 08/24/21   Myna Bright M, PA-C  Polyethyl Glycol-Propyl Glycol (SYSTANE OP) Place 1 drop into both eyes daily as needed (irritation).    [provider]  propranolol ER (INDERAL LA) 80 MG 24 hr capsule Take 1 capsule (80 mg total) by mouth daily. 01/24/17   Tat, Eustace Quail, DO  Rimegepant Sulfate (NURTEC) 75 MG TBDP Samples of this drug were given to the patient, quantity 3, Lot Number [id number] 05/24/21   Tat, Eustace Quail, DO  rOPINIRole (REQUIP) 0.25 MG tablet TAKE 1-2 TABLETS (0.25-0.5 MG TOTAL) BY MOUTH AT BEDTIME. 09/05/22   Gregor Hams, MD  SYNTHROID 112 MCG tablet Take 112 mcg by mouth daily. 01/18/17   [provider]  zolpidem (AMBIEN) 5 MG tablet Take 5 mg at bedtime by mouth.    [provider]      Allergies    Clams [shellfish allergy], Augmentin [amoxicillin-pot clavulanate],  and Biaxin [clarithromycin]    Review of Systems   Review of Systems  All other systems reviewed and are negative.   Physical Exam Updated Vital Signs There were no vitals taken for this visit. Physical Exam Vitals and nursing note reviewed.  Constitutional:      General: She is not in acute distress.    Appearance: Normal appearance. She is well-developed. She is not toxic-appearing.  HENT:     Head: Normocephalic and atraumatic.  Eyes:     General: Lids are normal.     Conjunctiva/sclera: Conjunctivae normal.     Pupils: Pupils are equal, round, and reactive to light.  Neck:     Thyroid: No thyroid mass.     Trachea: No tracheal  deviation.  Cardiovascular:     Rate and Rhythm: Normal rate and regular rhythm.     Heart sounds: Normal heart sounds. No murmur heard.    No gallop.  Pulmonary:     Effort: Pulmonary effort is normal. No respiratory distress.     Breath sounds: Normal breath sounds. No stridor. No decreased breath sounds, wheezing, rhonchi or rales.  Abdominal:     General: There is no distension.     Palpations: Abdomen is soft.     Tenderness: There is abdominal tenderness in the left lower quadrant. There is guarding. There is no rebound.    Musculoskeletal:        General: No tenderness. Normal range of motion.     Cervical back: Normal range of motion and neck supple.  Skin:    General: Skin is warm and dry.     Findings: No abrasion or rash.  Neurological:     Mental Status: She is alert and oriented to person, place, and time. Mental status is at baseline.     GCS: GCS eye subscore is 4. GCS verbal subscore is 5. GCS motor subscore is 6.     Cranial Nerves: No cranial nerve deficit.     Sensory: No sensory deficit.     Motor: Motor function is intact.  Psychiatric:        Attention and Perception: Attention normal.        Speech: Speech normal.        Behavior: Behavior normal.     ED Results / Procedures / Treatments   Labs (all labs ordered are listed, but only abnormal results are displayed) Labs Reviewed - No data to display  EKG None  Radiology No results found.  Procedures Procedures    Medications Ordered in ED Medications  lactated ringers infusion (has no administration in time range)  morphine (PF) 4 MG/ML injection 6 mg (has no administration in time range)    ED Course/ Medical Decision Making/ A&P                             Medical Decision Making Amount and/or Complexity of Data Reviewed Labs: ordered. Radiology: ordered.  Risk Prescription drug management.   Patient with concern for possible recurrent diverticulitis which was confirmed by  abdominal CT from interpretation.  Also demonstrates possible early developing abscess.  Patient medicated for pain here.  Started on IV antibiotics.  Will require admission.  Will consult hospitalist team        Final Clinical Impression(s) / ED Diagnoses Final diagnoses:  None    Rx / DC Orders ED Discharge Orders     None  Lacretia Leigh, MD 12/11/22 1257

## 2022-12-11 NOTE — ED Notes (Signed)
Pt provided pain medication; call light in reach, will continue with POC.

## 2022-12-11 NOTE — Hospital Course (Signed)
Amanda Fry is a 72 y.o. F with PVD s/p stent carotid, HTN, hypothyroidism and DM who presented with abdominal pain in the RLQ  CT abodmen showed diverticulitis and abscess <1cm.

## 2022-12-11 NOTE — ED Notes (Signed)
Called [facility] -- informed that the patient Bed Assignment is Ready 

## 2022-12-11 NOTE — ED Notes (Signed)
Pt resting advised pain is tolerable; updated on POC, call light in reach- no other needs at this time.

## 2022-12-11 NOTE — H&P (Addendum)
History and Physical    Patient: Amanda Fry [id number] DOB: September 24, 1951 DOA: 12/11/2022 DOS: the patient was seen and examined on 12/11/2022 PCP: Carol Ada, MD  Patient coming from: Home  Chief Complaint:  Chief Complaint  Patient presents with   Abdominal Pain   HPI: Amanda Fry is a 72 y.o. female with medical history significant of HTN, HLD, hypothyroidism, pre-diabetes and prior h/o diverticulitis who presents with a 5 day h/o worsening LLQ abdominal pain, colic-like at times, sharp at times, with radiation across lower abdomen.  No associated vomiting, but has had nausea, low-grade fever, chills and 1 episode of diarrhea associated with the pain.  No alleviating factors, but pain medication given has alleviated the pain for about 1 hour.  In the ED, her WBC was 7.7, chemistries showed mild elevation of serum glucose at 108, and serum lactate was 0.7.   Review of Systems: As mentioned in the history of present illness. All other systems reviewed and are negative. Does endorse a rash to her lower back, pruritic at time.   Past Medical History:  Diagnosis Date   Allergic rhinitis    Anterior communicating artery aneurysm    Anxiety    Carpal tunnel syndrome    Cyst of kidney, acquired    Diverticulitis    Esophageal reflux    Family history of adverse reaction to anesthesia    sister N/V with anesthesia   Fibromyalgia    GERD (gastroesophageal reflux disease)    Headache    HLD (hyperlipidemia)    Hypertension    Hypothyroidism    Insomnia    Plantar fasciitis    left foot   PONV (postoperative nausea and vomiting)    Past Surgical History:  Procedure Laterality Date   APPENDECTOMY  2001   BREAST LUMPECTOMY Left 1994   CARPAL TUNNEL RELEASE Right    CESAREAN SECTION     IR ANGIO EXTRACRAN SEL COM CAROTID INNOMINATE UNI R MOD SED  07/11/2018   IR ANGIO INTRA EXTRACRAN SEL COM CAROTID INNOMINATE BILAT MOD SED  05/10/2018   IR ANGIO INTRA EXTRACRAN  SEL COM CAROTID INNOMINATE BILAT MOD SED  12/10/2019   IR ANGIO INTRA EXTRACRAN SEL INTERNAL CAROTID UNI L MOD SED  07/11/2018   IR ANGIO VERTEBRAL SEL SUBCLAVIAN INNOMINATE UNI R MOD SED  07/11/2018   IR ANGIO VERTEBRAL SEL VERTEBRAL BILAT MOD SED  05/10/2018   IR ANGIO VERTEBRAL SEL VERTEBRAL UNI L MOD SED  07/11/2018   IR ANGIO VERTEBRAL SEL VERTEBRAL UNI R MOD SED  12/10/2019   IR ANGIOGRAM FOLLOW UP STUDY  07/11/2018   IR INTRAVSC STENT CERV CAROTID W/EMB-PROT MOD SED INCL ANGIO  05/15/2018   IR NEURO EACH ADD'L AFTER BASIC UNI LEFT (MS)  07/11/2018   IR TRANSCATH/EMBOLIZ  07/11/2018   IR US GUIDE VASC ACCESS RIGHT  12/10/2019   PARTIAL HYSTERECTOMY  1994   RADIOLOGY WITH ANESTHESIA N/A 05/10/2018   Procedure: EMBOLIZATION;  Surgeon: Luanne Bras, MD;  Location: [facility];  Service: Radiology;  Laterality: N/A;   RADIOLOGY WITH ANESTHESIA N/A 05/15/2018   Procedure: Angioplasty with stenting;  Surgeon: Luanne Bras, MD;  Location: [facility];  Service: Radiology;  Laterality: N/A;   RADIOLOGY WITH ANESTHESIA N/A 07/11/2018   Procedure: IR WITH ANESTHESIAANEURYSM/EMBOLIZATION;  Surgeon: Luanne Bras, MD;  Location: [facility];  Service: Radiology;  Laterality: N/A;   TONSILLECTOMY     Social History:  reports that she quit smoking about 13 years ago. Her smoking  use included cigarettes. She has a 5.00 pack-year smoking history. She has never used smokeless tobacco. She reports current alcohol use of about 2.0 standard drinks of alcohol per week. She reports that she does not use drugs.  Allergies  Allergen Reactions   Clams [Shellfish Allergy] Swelling    SWELLING REACTION UNSPECIFIED    Augmentin [Amoxicillin-Pot Clavulanate] Nausea And Vomiting    Did it involve swelling of the face/tongue/throat, SOB, or low BP? No Did it involve sudden or severe rash/hives, skin peeling, or any reaction on the inside of your mouth or nose? No Did you need to seek medical attention at a  hospital or doctor's office? No When did it last happen?      2-3 years If all above answers are "NO", may proceed with cephalosporin use.     Biaxin [Clarithromycin] Nausea And Vomiting    Family History  Problem Relation Age of Onset   CAD Father 55       CABG   Cancer Mother        BREAST   Restless legs syndrome Mother    Healthy Brother    Healthy Brother    Diabetes Sister    Cancer Sister        BREAST   Restless legs syndrome Sister    Healthy Son    Healthy Daughter    Heart disease Maternal Grandmother    Heart disease Maternal Grandfather    Emphysema Paternal Grandfather     Prior to Admission medications   Medication Sig Start Date End Date Taking? Authorizing Provider  aspirin EC 325 MG tablet Take 325 mg by mouth at bedtime.    [provider]  atorvastatin (LIPITOR) 40 MG tablet Take 1 tablet (40 mg total) by mouth daily. Patient taking differently: Take 40 mg by mouth every evening. 08/15/17 05/21/21  Josue Hector, MD  AYR SALINE NASAL NA Place 1 spray into the nose daily as needed (congestion).    [provider]  Calcium Carb-Cholecalciferol 600-800 MG-UNIT TABS Take 1 tablet by mouth at bedtime.    [provider]  calcium carbonate (TUMS EX) 750 MG chewable tablet Chew 750 mg by mouth daily.    [provider]  cetirizine (ZYRTEC) 10 MG tablet Take 10 mg by mouth at bedtime.    [provider]  cholecalciferol (VITAMIN D) 1000 units tablet Take 1,000 Units by mouth 2 (two) times daily.    [provider]  ciprofloxacin (CIPRO) 500 MG tablet Take 1 tablet (500 mg total) by mouth every 12 (twelve) hours. 08/24/21   Hendricks Limes, PA-C  clopidogrel (PLAVIX) 75 MG tablet Take 1 tablet (75 mg total) by mouth daily. 07/07/22   Ascencion Dike, PA-C  famotidine (PEPCID) 20 MG tablet Take 20 mg by mouth at bedtime.    [provider]  Liniments (BLUE-EMU SUPER STRENGTH EX) Apply 1 application  topically daily as needed (back pain).    [provider]  Magnesium 500 MG CAPS Take 500 mg by mouth at bedtime.     [provider]  Melatonin 5 MG CAPS Take 5 mg by mouth at bedtime.    [provider]  Menthol, Topical Analgesic, (BIOFREEZE EX) Apply 1 application topically daily as needed (back pain).    [provider]  metroNIDAZOLE (FLAGYL) 500 MG tablet Take 1 tablet (500 mg total) by mouth 2 (two) times daily. 08/24/21   Hendricks Limes, PA-C  Polyethyl Glycol-Propyl Glycol (SYSTANE OP)  Place 1 drop into both eyes daily as needed (irritation).    [provider]  propranolol ER (INDERAL LA) 80 MG 24 hr capsule Take 1 capsule (80 mg total) by mouth daily. 01/24/17   Tat, Eustace Quail, DO  Rimegepant Sulfate (NURTEC) 75 MG TBDP Samples of this drug were given to the patient, quantity 3, Lot Number [id number] 05/24/21   Tat, Eustace Quail, DO  rOPINIRole (REQUIP) 0.25 MG tablet TAKE 1-2 TABLETS (0.25-0.5 MG TOTAL) BY MOUTH AT BEDTIME. 09/05/22   Gregor Hams, MD  SYNTHROID 112 MCG tablet Take 112 mcg by mouth daily. 01/18/17   [provider]  zolpidem (AMBIEN) 5 MG tablet Take 5 mg at bedtime by mouth.    [provider]    Physical Exam: Vitals:   12/11/22 1115 12/11/22 1200 12/11/22 1400 12/11/22 1505  BP: 126/74 (!) 119/56 (!) 109/46 100/72  Pulse: 77 75 65 66  Resp: 16 16 15 18   Temp:    98.5 F (36.9 C)  TempSrc:    Oral  SpO2: 97% 94% 97% 99%   General: No acute distress.  Well appearing. HEENT: PEERLA, oropharynx clear. Cardiovascular: Heart sounds show a regular rate, and rhythm. No gallops or rubs. No murmurs. No JVD. Lungs: Clear to auscultation bilaterally with good air movement. No rales, rhonchi or wheezes. Abdomen: Soft, TTP LLQ. No masses. No hepatosplenomegaly. Neurological: Alert and oriented 3. Moves all extremities 4 with equal strength. Cranial nerves II through XII grossly intact. Skin: Warm  and dry. Rash lower back. Extremities: No clubbing or cyanosis. No edema. Pedal pulses 2+. Psychiatric: Mood and affect are normal. Insight and judgment are WNL.  Data Reviewed:   CT shows Diffuse colonic diverticulosis with a focal area of wall thickening in the proximal sigmoid colon with pericolonic edema/inflammation consistent with acute diverticulitis. 9 mm low-density structure along the lateral wall of the segment is probably a fluid-filled, inflamed diverticulum although a tiny intramural abscess could have this appearance. No findings to suggest perforation. 2.  Aortic Atherosclerosis (ICD10-I70.0).   Basic Metabolic Panel: Recent Labs  Lab 12/11/22 1046 12/11/22 1646  NA 138  --   K 4.1  --   CL 104  --   CO2 26  --   GLUCOSE 108*  --   BUN 16  --   CREATININE 0.98 1.05*  CALCIUM 9.7  --    GFR Estimated Creatinine Clearance: 63.3 mL/min (A) (by C-G formula based on SCr of 1.05 mg/dL (H)).  CBC: Recent Labs  Lab 12/11/22 1046 12/11/22 1646  WBC 7.7 6.0  NEUTROABS 5.2  --   HGB 13.5 13.2  HCT 40.4 40.8  MCV 94.8 97.4  PLT 191 177   Assessment and Plan: Principal Problem:  Diverticulitis large intestine Imaging reviewed. No surgical indication at present. Start on IV Cipro/Flagyl.  Clear liquid diet, advance as tolerated. Pain control meds/anti-emetic ordered. Add bowel regimen given constipating side effects of opioids.  Active Problems:   Hypothyroidism:  Continue synthroid.   HLD: Continue statin.   Carotid artery disease: Continue ASA/Plavix.   RLS: Continue Requip.   HTN: Continue propranolol. Also has essential tremor.    Advance Care Planning:   Code Status: Full Code. Discussed w/ patient.  Consults: None  Family Communication: Husband at bedside.  Severity of Illness: The appropriate patient status for this patient is OBSERVATION. Observation status is judged to be reasonable and necessary in order to provide the required  intensity of service  to ensure the patient's safety. The patient's presenting symptoms, physical exam findings, and initial radiographic and laboratory data in the context of their medical condition is felt to place them at decreased risk for further clinical deterioration. Furthermore, it is anticipated that the patient will be medically stable for discharge from the hospital within 2 midnights of admission.   Author: Jacquelynn Cree, MD 12/11/2022 4:05 PM  For on call review [email or web address].

## 2022-12-12 DIAGNOSIS — M797 Fibromyalgia: Secondary | ICD-10-CM | POA: Diagnosis present

## 2022-12-12 DIAGNOSIS — E785 Hyperlipidemia, unspecified: Secondary | ICD-10-CM | POA: Diagnosis present

## 2022-12-12 DIAGNOSIS — Z6837 Body mass index (BMI) 37.0-37.9, adult: Secondary | ICD-10-CM | POA: Diagnosis not present

## 2022-12-12 DIAGNOSIS — Z8249 Family history of ischemic heart disease and other diseases of the circulatory system: Secondary | ICD-10-CM | POA: Diagnosis not present

## 2022-12-12 DIAGNOSIS — Z833 Family history of diabetes mellitus: Secondary | ICD-10-CM | POA: Diagnosis not present

## 2022-12-12 DIAGNOSIS — K219 Gastro-esophageal reflux disease without esophagitis: Secondary | ICD-10-CM | POA: Diagnosis present

## 2022-12-12 DIAGNOSIS — Z1152 Encounter for screening for COVID-19: Secondary | ICD-10-CM | POA: Diagnosis not present

## 2022-12-12 DIAGNOSIS — Z87891 Personal history of nicotine dependence: Secondary | ICD-10-CM | POA: Diagnosis not present

## 2022-12-12 DIAGNOSIS — K5720 Diverticulitis of large intestine with perforation and abscess without bleeding: Secondary | ICD-10-CM

## 2022-12-12 DIAGNOSIS — F419 Anxiety disorder, unspecified: Secondary | ICD-10-CM | POA: Diagnosis present

## 2022-12-12 DIAGNOSIS — I6529 Occlusion and stenosis of unspecified carotid artery: Secondary | ICD-10-CM

## 2022-12-12 DIAGNOSIS — G250 Essential tremor: Secondary | ICD-10-CM | POA: Diagnosis present

## 2022-12-12 DIAGNOSIS — I739 Peripheral vascular disease, unspecified: Secondary | ICD-10-CM | POA: Diagnosis present

## 2022-12-12 DIAGNOSIS — G2581 Restless legs syndrome: Secondary | ICD-10-CM | POA: Diagnosis present

## 2022-12-12 DIAGNOSIS — Z90711 Acquired absence of uterus with remaining cervical stump: Secondary | ICD-10-CM | POA: Diagnosis not present

## 2022-12-12 DIAGNOSIS — Z91013 Allergy to seafood: Secondary | ICD-10-CM | POA: Diagnosis not present

## 2022-12-12 DIAGNOSIS — Z888 Allergy status to other drugs, medicaments and biological substances status: Secondary | ICD-10-CM | POA: Diagnosis not present

## 2022-12-12 DIAGNOSIS — Z881 Allergy status to other antibiotic agents status: Secondary | ICD-10-CM | POA: Diagnosis not present

## 2022-12-12 DIAGNOSIS — I10 Essential (primary) hypertension: Secondary | ICD-10-CM | POA: Diagnosis present

## 2022-12-12 DIAGNOSIS — K5792 Diverticulitis of intestine, part unspecified, without perforation or abscess without bleeding: Secondary | ICD-10-CM | POA: Diagnosis present

## 2022-12-12 DIAGNOSIS — E669 Obesity, unspecified: Secondary | ICD-10-CM | POA: Insufficient documentation

## 2022-12-12 DIAGNOSIS — K5780 Diverticulitis of intestine, part unspecified, with perforation and abscess without bleeding: Secondary | ICD-10-CM | POA: Diagnosis present

## 2022-12-12 DIAGNOSIS — E039 Hypothyroidism, unspecified: Secondary | ICD-10-CM | POA: Diagnosis present

## 2022-12-12 DIAGNOSIS — R7303 Prediabetes: Secondary | ICD-10-CM | POA: Diagnosis present

## 2022-12-12 DIAGNOSIS — Z825 Family history of asthma and other chronic lower respiratory diseases: Secondary | ICD-10-CM | POA: Diagnosis not present

## 2022-12-12 DIAGNOSIS — Z79890 Hormone replacement therapy (postmenopausal): Secondary | ICD-10-CM | POA: Diagnosis not present

## 2022-12-12 DIAGNOSIS — Z79899 Other long term (current) drug therapy: Secondary | ICD-10-CM | POA: Diagnosis not present

## 2022-12-12 LAB — CBC
HCT: 39.8 % (ref 36.0–46.0)
Hemoglobin: 12.7 g/dL (ref 12.0–15.0)
MCH: 31.2 pg (ref 26.0–34.0)
MCHC: 31.9 g/dL (ref 30.0–36.0)
MCV: 97.8 fL (ref 80.0–100.0)
Platelets: 149 10*3/uL — ABNORMAL LOW (ref 150–400)
RBC: 4.07 MIL/uL (ref 3.87–5.11)
RDW: 12.4 % (ref 11.5–15.5)
WBC: 5 10*3/uL (ref 4.0–10.5)
nRBC: 0 % (ref 0.0–0.2)

## 2022-12-12 LAB — BASIC METABOLIC PANEL
Anion gap: 7 (ref 5–15)
BUN: 10 mg/dL (ref 8–23)
CO2: 26 mmol/L (ref 22–32)
Calcium: 9.1 mg/dL (ref 8.9–10.3)
Chloride: 103 mmol/L (ref 98–111)
Creatinine, Ser: 1 mg/dL (ref 0.44–1.00)
GFR, Estimated: >60 mL/min (ref >60)
Glucose, Bld: 119 mg/dL — ABNORMAL HIGH (ref 70–99)
Potassium: 4.1 mmol/L (ref 3.5–5.1)
Sodium: 136 mmol/L (ref 135–145)

## 2022-12-12 MED ORDER — PROCHLORPERAZINE EDISYLATE 10 MG/2ML IJ SOLN
10.0000 mg | Freq: Four times a day (QID) | INTRAMUSCULAR | Status: DC | PRN
  Administered 2022-12-12: 10 mg via INTRAVENOUS
  Filled 2022-12-12: qty 2

## 2022-12-12 NOTE — Progress Notes (Signed)
  Progress Note   Patient: Amanda Fry [id number] DOB: [birth year] DOA: 12/11/2022     0 DOS: the patient was seen and examined on 12/12/2022 at 9:22AM      Brief hospital course: Amanda Fry is a 72 y.o. F with PVD s/p stent carotid, HTN, hypothyroidism and DM who presented with abdominal pain in the RLQ  CT abodmen showed diverticulitis and abscess <1cm.        Assessment and Plan: * Diverticulitis of intestine with abscess CT shows subcentimeter abscess. - Continue Cipro/Flagyl IV    Obesity (BMI 30-39.9) BMI 37  Hypothyroidism (acquired) - Continue Synthroid  Hyperlipidemia - Continue atorvastatin  Carotid stenosis, asymptomatic, unspecified laterality - Continue aspirin, Plavix, statin          Subjective: Patient has continued LLQ tenderness, no fever, no confusion, no vomiting.     Physical Exam: BP 109/60 (BP Location: Left Arm)   Pulse 66   Temp 98.3 F (36.8 C) (Oral)   Resp 18   Ht 5\' 7"  (1.702 m)   Wt 108.6 kg   SpO2 96%   BMI 37.51 kg/m   Adult female, lying in bed, no acute distress RRR, no murmurs, no peripheral edema Respiratory rate normal, lungs clear without rales or wheezes She has tenderness and guarding in the left lower quadrant Attention normal, affect appropriate, judgment insight appear normal    Data Reviewed: Patient metabolic panel unremarkable CBC without leukocytosis CT of the abdomen pelvis shows subcentimeter abscess and diverticulitis  Family Communication: Daughter at the bedside    Disposition: Status is: Inpatient Given the fact that this patient is a fluid collection suspicious for abscess, will continue IV antibiotics        Author: Edwin Dada, MD 12/12/2022 1:55 PM  For on call review [email or web address].

## 2022-12-12 NOTE — Assessment & Plan Note (Signed)
Continue atorvastatin

## 2022-12-12 NOTE — Assessment & Plan Note (Signed)
Continue Synthroid °

## 2022-12-12 NOTE — Assessment & Plan Note (Signed)
Continue aspirin, Plavix, statin. ?

## 2022-12-12 NOTE — Assessment & Plan Note (Signed)
CT shows subcentimeter abscess. - Continue Cipro/Flagyl IV

## 2022-12-12 NOTE — Assessment & Plan Note (Signed)
BMI 37 

## 2022-12-13 ENCOUNTER — Other Ambulatory Visit (HOSPITAL_COMMUNITY): Payer: Self-pay

## 2022-12-13 DIAGNOSIS — K5720 Diverticulitis of large intestine with perforation and abscess without bleeding: Secondary | ICD-10-CM | POA: Diagnosis not present

## 2022-12-13 LAB — CBC
HCT: 37.9 % (ref 36.0–46.0)
Hemoglobin: 12.1 g/dL (ref 12.0–15.0)
MCH: 30.9 pg (ref 26.0–34.0)
MCHC: 31.9 g/dL (ref 30.0–36.0)
MCV: 96.9 fL (ref 80.0–100.0)
Platelets: 155 10*3/uL (ref 150–400)
RBC: 3.91 MIL/uL (ref 3.87–5.11)
RDW: 12.4 % (ref 11.5–15.5)
WBC: 5.2 10*3/uL (ref 4.0–10.5)
nRBC: 0 % (ref 0.0–0.2)

## 2022-12-13 MED ORDER — ONDANSETRON HCL 4 MG PO TABS
4.0000 mg | ORAL_TABLET | Freq: Four times a day (QID) | ORAL | 0 refills | Status: AC | PRN
  Filled 2022-12-13: qty 20, 5d supply, fill #0

## 2022-12-13 MED ORDER — METRONIDAZOLE 500 MG PO TABS
500.0000 mg | ORAL_TABLET | Freq: Two times a day (BID) | ORAL | 0 refills | Status: AC
  Filled 2022-12-13: qty 13, 7d supply, fill #0

## 2022-12-13 MED ORDER — CIPROFLOXACIN HCL 500 MG PO TABS
500.0000 mg | ORAL_TABLET | Freq: Two times a day (BID) | ORAL | 0 refills | Status: AC
  Filled 2022-12-13: qty 13, 7d supply, fill #0

## 2022-12-13 NOTE — Progress Notes (Signed)
Patient was given discharge instructions, and all questions were answered. Patient was stable for discharge and was walked to the main exit. 

## 2022-12-13 NOTE — Discharge Summary (Signed)
Physician Discharge Summary   Patient: Amanda Fry MRN: [id number] DOB: [birth year]  Admit date:     12/11/2022  Discharge date: 12/13/22  Discharge Physician: Edwin Dada   PCP: Carol Ada, MD     Recommendations at discharge:  Follow up with PCP Dr. Tamala Julian in 1 week     Discharge Diagnoses: Principal Problem:   Diverticulitis of intestine with abscess Active Problems:   Carotid stenosis, asymptomatic, unspecified laterality   Hyperlipidemia   Hypothyroidism (acquired)   Obesity (BMI 30-39.9)      Hospital Course: Mrs. Wickersham is a 72 y.o. F with PVD s/p stent carotid, HTN, hypothyroidism and DM who presented with abdominal pain in the RLQ  CT abodmen showed diverticulitis.  A subcentimeter abscess could not be ruled out, and so she was admitted for antibiotics.     * Diverticulitis of intestine with possible abscess Patient admitted and treate with 48 hours IV antibiotics.  She had no fever, no leukocytosis, and was tolerating oral intake well.  She had improving mild abdominal pain.  Discharged to complete a week of Cipro/Flagyl and follow up with PCP                  The [facility] was reviewed for this patient prior to discharge.   Consultants: None Procedures performed: CT abdomen  Disposition: Home Diet recommendation:  Low residue  DISCHARGE MEDICATION: Allergies as of 12/13/2022       Reactions   Clams [shellfish Allergy] Swelling   SWELLING REACTION UNSPECIFIED    Augmentin [amoxicillin-pot Clavulanate] Nausea And Vomiting   Did it involve swelling of the face/tongue/throat, SOB, or low BP? No Did it involve sudden or severe rash/hives, skin peeling, or any reaction on the inside of your mouth or nose? No Did you need to seek medical attention at a hospital or doctor's office? No When did it last happen?      2-3 years If all above answers are "NO", may proceed with cephalosporin  use.   Biaxin [clarithromycin] Nausea And Vomiting        Medication List     TAKE these medications    aspirin EC 325 MG tablet Take 325 mg by mouth at bedtime.   atorvastatin 40 MG tablet Commonly known as: LIPITOR Take 1 tablet (40 mg total) by mouth daily. What changed: when to take this   BIOFREEZE EX Apply 1 application topically daily as needed (back pain).   BLUE-EMU SUPER STRENGTH EX Apply 1 application topically daily as needed (back pain).   Calcium Carb-Cholecalciferol 600-800 MG-UNIT Tabs Take 1 tablet by mouth at bedtime.   calcium carbonate 750 MG chewable tablet Commonly known as: TUMS EX Chew 750 mg by mouth daily.   cetirizine 10 MG tablet Commonly known as: ZYRTEC Take 10 mg by mouth at bedtime.   cholecalciferol 1000 units tablet Commonly known as: VITAMIN D Take 1,000 Units by mouth 2 (two) times daily.   ciprofloxacin 500 MG tablet Commonly known as: Cipro Take 1 tablet (500 mg total) by mouth 2 (two) times daily for 7 days.   clopidogrel 75 MG tablet Commonly known as: PLAVIX Take 1 tablet (75 mg total) by mouth daily.   famotidine 20 MG tablet Commonly known as: PEPCID Take 20 mg by mouth at bedtime.   gabapentin 100 MG capsule Commonly known as: NEURONTIN Take 200 mg by mouth at bedtime.   Magnesium 500 MG Caps Take 500 mg by mouth at  bedtime.   Melatonin 5 MG Caps Take 10 mg by mouth at bedtime.   metroNIDAZOLE 500 MG tablet Commonly known as: Flagyl Take 1 tablet (500 mg total) by mouth 2 (two) times daily for 7 days.   Nurtec 75 MG Tbdp Generic drug: Rimegepant Sulfate Samples of this drug were given to the patient, quantity 3, Lot Number [id number] What changed:  how much to take how to take this when to take this reasons to take this additional instructions   ondansetron 4 MG tablet Commonly known as: ZOFRAN Take 1 tablet (4 mg total) by mouth every 6 (six) hours as needed for nausea.   propranolol  ER 80 MG 24 hr capsule Commonly known as: INDERAL LA Take 1 capsule (80 mg total) by mouth daily.   rOPINIRole 0.25 MG tablet Commonly known as: REQUIP TAKE 1-2 TABLETS (0.25-0.5 MG TOTAL) BY MOUTH AT BEDTIME. What changed: how much to take   Synthroid 112 MCG tablet Generic drug: levothyroxine Take 112 mcg by mouth daily.   SYSTANE OP Place 1 drop into both eyes daily as needed (irritation).   zolpidem 5 MG tablet Commonly known as: AMBIEN Take 5 mg at bedtime by mouth.        Follow-up Information     Carol Ada, MD. Schedule an appointment as soon as possible for a visit in 1 week(s).   Specialty: Family Medicine Contact information: [address] [place] [address] [phone]                 Discharge Instructions     Discharge instructions   Complete by: As directed    **IMPORTANT DISCHARGE INSTRUCTIONS**   From Dr. Loleta Books: You were admitted for diverticulitis We believe there may have been a very small abscess, and so you were treated here with IV antibiotics for 48 hours.  Go home and complete 7 more days of Ciprofloxacin 500 mg and metronidazole/Flagyl 500 mg twice daily starting tonight  Metronidazole/Flagyl can cause nausea when taken orally, so use ondansetron/Zofran (a nausea medicine) if you have nausea or throwing up, and probably take the Flagyl with food.  Go see your primary doctor in 1 week   Increase activity slowly   Complete by: As directed        Discharge Exam: Filed Weights   12/11/22 1636  Weight: 108.6 kg    General: Pt is alert, awake, not in acute distress Cardiovascular: RRR, nl S1-S2, no murmurs appreciated.   No LE edema.   Respiratory: Normal respiratory rate and rhythm.  CTAB without rales or wheezes. Abdominal: Abdomen soft and with very mild LLQ tenderness, no rigidity no guarding.  No distension or HSM.   Neuro/Psych: Strength symmetric in upper and lower extremities.  Judgment and  insight appear normal.   Condition at discharge: good  The results of significant diagnostics from this hospitalization (including imaging, microbiology, ancillary and laboratory) are listed below for reference.   Imaging Studies: CT Abdomen Pelvis W Contrast  Result Date: 12/11/2022 CLINICAL DATA:  Abdominal pain. Left lower quadrant pain. History of diverticulitis. EXAM: CT ABDOMEN AND PELVIS WITH CONTRAST TECHNIQUE: Multidetector CT imaging of the abdomen and pelvis was performed using the standard protocol following bolus administration of intravenous contrast. RADIATION DOSE REDUCTION: This exam was performed according to the departmental dose-optimization program which includes automated exposure control, adjustment of the mA and/or kV according to patient size and/or use of iterative reconstruction technique. CONTRAST:  126mL OMNIPAQUE IOHEXOL  300 MG/ML  SOLN COMPARISON:  08/24/2021 FINDINGS: Lower chest: Unremarkable. Hepatobiliary: No suspicious focal abnormality within the liver parenchyma. There is no evidence for gallstones, gallbladder wall thickening, or pericholecystic fluid. No intrahepatic or extrahepatic biliary dilation. Pancreas: No focal mass lesion. No dilatation of the main duct. No intraparenchymal cyst. No peripancreatic edema. Spleen: No splenomegaly. No focal mass lesion. Adrenals/Urinary Tract: No adrenal nodule or mass. Right kidney unremarkable. 12.0 cm simple cyst noted left kidney, similar to prior. Other smaller cysts are also noted in the left kidney as before. No followup imaging is recommended. No evidence for hydroureter. The urinary bladder appears normal for the degree of distention. Stomach/Bowel: Stomach is unremarkable. No gastric wall thickening. No evidence of outlet obstruction. Duodenum is normally positioned as is the ligament of Treitz. Duodenal diverticulum noted. No small bowel wall thickening. No small bowel dilatation. The terminal ileum is normal. The  appendix is not well visualized, but there is no edema or inflammation in the region of the cecum. Diverticular changes noted diffusely in the colon, most advanced in the sigmoid segment. There is a focal area of wall thickening in the proximal sigmoid colon with pericolonic edema/inflammation. 9 mm low-density structure along the lateral wall of the segment is probably a fluid-filled, inflamed diverticulum although a tiny intramural abscess could have this appearance. No findings to suggest perforation. Vascular/Lymphatic: There is mild atherosclerotic calcification of the abdominal aorta without aneurysm. There is no gastrohepatic or hepatoduodenal ligament lymphadenopathy. No retroperitoneal or mesenteric lymphadenopathy. No pelvic sidewall lymphadenopathy. Reproductive: Uterus surgically absent.  There is no adnexal mass. Other: No intraperitoneal free fluid. Musculoskeletal: No worrisome lytic or sclerotic osseous abnormality. IMPRESSION: 1. Diffuse colonic diverticulosis with a focal area of wall thickening in the proximal sigmoid colon with pericolonic edema/inflammation consistent with acute diverticulitis. 9 mm low-density structure along the lateral wall of the segment is probably a fluid-filled, inflamed diverticulum although a tiny intramural abscess could have this appearance. No findings to suggest perforation. 2.  Aortic Atherosclerosis (ICD10-I70.0). Electronically Signed   By: Misty Stanley M.D.   On: 12/11/2022 12:37    Microbiology: Results for orders placed or performed during the hospital encounter of 07/11/18  MRSA PCR Screening     Status: None   Collection Time: 07/11/18  2:26 PM   Specimen: Nasal Mucosa; Nasopharyngeal  Result Value Ref Range Status   MRSA by PCR NEGATIVE NEGATIVE Final    Comment:        The GeneXpert MRSA Assay (FDA approved for NASAL specimens only), is one component of a comprehensive MRSA colonization surveillance program. It is not intended to diagnose  MRSA infection nor to guide or monitor treatment for MRSA infections. Performed at [facility], [place] [address] [place], [place] [address]     Labs: CBC: Recent Labs  Lab 12/11/22 1046 12/11/22 1646 12/12/22 0447 12/13/22 0457  WBC 7.7 6.0 5.0 5.2  NEUTROABS 5.2  --   --   --   HGB 13.5 13.2 12.7 12.1  HCT 40.4 40.8 39.8 37.9  MCV 94.8 97.4 97.8 96.9  PLT 191 177 149* [id number]   Basic Metabolic Panel: Recent Labs  Lab 12/11/22 1046 12/11/22 1646 12/12/22 0447  NA 138  --  136  K 4.1  --  4.1  CL 104  --  103  CO2 26  --  26  GLUCOSE 108*  --  119*  BUN 16  --  10  CREATININE 0.98 1.05* 1.00  CALCIUM 9.7  --  9.1   Liver Function Tests: No results for input(s): "AST", "ALT", "ALKPHOS", "BILITOT", "PROT", "ALBUMIN" in the last 168 hours. CBG: No results for input(s): "GLUCAP" in the last 168 hours.  Discharge time spent: approximately 25 minutes spent on discharge counseling, evaluation of patient on day of discharge, and coordination of discharge planning with nursing, social work, pharmacy and case management  Signed: Edwin Dada, MD [facility] 12/13/2022

## 2022-12-16 DIAGNOSIS — T7840XA Allergy, unspecified, initial encounter: Secondary | ICD-10-CM | POA: Diagnosis not present

## 2022-12-20 DIAGNOSIS — K5792 Diverticulitis of intestine, part unspecified, without perforation or abscess without bleeding: Secondary | ICD-10-CM | POA: Diagnosis not present

## 2022-12-20 DIAGNOSIS — Z6835 Body mass index (BMI) 35.0-35.9, adult: Secondary | ICD-10-CM | POA: Diagnosis not present

## 2022-12-20 DIAGNOSIS — T7840XD Allergy, unspecified, subsequent encounter: Secondary | ICD-10-CM | POA: Diagnosis not present

## 2022-12-20 DIAGNOSIS — R7303 Prediabetes: Secondary | ICD-10-CM | POA: Diagnosis not present

## 2022-12-22 DIAGNOSIS — G4733 Obstructive sleep apnea (adult) (pediatric): Secondary | ICD-10-CM | POA: Diagnosis not present

## 2022-12-22 DIAGNOSIS — E669 Obesity, unspecified: Secondary | ICD-10-CM | POA: Diagnosis not present

## 2022-12-22 DIAGNOSIS — I10 Essential (primary) hypertension: Secondary | ICD-10-CM | POA: Diagnosis not present

## 2022-12-28 DIAGNOSIS — R21 Rash and other nonspecific skin eruption: Secondary | ICD-10-CM | POA: Diagnosis not present

## 2022-12-28 DIAGNOSIS — K5792 Diverticulitis of intestine, part unspecified, without perforation or abscess without bleeding: Secondary | ICD-10-CM | POA: Diagnosis not present

## 2022-12-28 DIAGNOSIS — I10 Essential (primary) hypertension: Secondary | ICD-10-CM | POA: Diagnosis not present

## 2022-12-28 DIAGNOSIS — Z09 Encounter for follow-up examination after completed treatment for conditions other than malignant neoplasm: Secondary | ICD-10-CM | POA: Diagnosis not present

## 2023-01-03 DIAGNOSIS — R7303 Prediabetes: Secondary | ICD-10-CM | POA: Diagnosis not present

## 2023-01-03 DIAGNOSIS — G4733 Obstructive sleep apnea (adult) (pediatric): Secondary | ICD-10-CM | POA: Diagnosis not present

## 2023-01-03 DIAGNOSIS — Z6834 Body mass index (BMI) 34.0-34.9, adult: Secondary | ICD-10-CM | POA: Diagnosis not present

## 2023-01-03 DIAGNOSIS — E039 Hypothyroidism, unspecified: Secondary | ICD-10-CM | POA: Diagnosis not present

## 2023-01-09 ENCOUNTER — Encounter: Payer: Self-pay | Admitting: *Deleted

## 2023-01-24 DIAGNOSIS — E782 Mixed hyperlipidemia: Secondary | ICD-10-CM | POA: Diagnosis not present

## 2023-01-24 DIAGNOSIS — Z6833 Body mass index (BMI) 33.0-33.9, adult: Secondary | ICD-10-CM | POA: Diagnosis not present

## 2023-01-24 DIAGNOSIS — R7303 Prediabetes: Secondary | ICD-10-CM | POA: Diagnosis not present

## 2023-01-24 DIAGNOSIS — E039 Hypothyroidism, unspecified: Secondary | ICD-10-CM | POA: Diagnosis not present

## 2023-01-24 DIAGNOSIS — G4733 Obstructive sleep apnea (adult) (pediatric): Secondary | ICD-10-CM | POA: Diagnosis not present

## 2023-02-03 ENCOUNTER — Telehealth: Payer: Self-pay | Admitting: Student

## 2023-02-03 MED ORDER — CLOPIDOGREL BISULFATE 75 MG PO TABS: 75.0000 mg | ORAL_TABLET | Freq: Every day | ORAL | 5 refills | Status: DC

## 2023-02-03 NOTE — Telephone Encounter (Signed)
Patient contacted IR team for refill of Plavix. Plavix refill was sent to patient's preferred pharmacy. Kennieth Francois, PA-C 02/03/2023

## 2023-02-13 DIAGNOSIS — G4733 Obstructive sleep apnea (adult) (pediatric): Secondary | ICD-10-CM | POA: Diagnosis not present

## 2023-02-13 DIAGNOSIS — E039 Hypothyroidism, unspecified: Secondary | ICD-10-CM | POA: Diagnosis not present

## 2023-02-13 DIAGNOSIS — R7303 Prediabetes: Secondary | ICD-10-CM | POA: Diagnosis not present

## 2023-02-13 DIAGNOSIS — E782 Mixed hyperlipidemia: Secondary | ICD-10-CM | POA: Diagnosis not present

## 2023-02-13 DIAGNOSIS — Z6833 Body mass index (BMI) 33.0-33.9, adult: Secondary | ICD-10-CM | POA: Diagnosis not present

## 2023-02-28 DIAGNOSIS — E782 Mixed hyperlipidemia: Secondary | ICD-10-CM | POA: Diagnosis not present

## 2023-02-28 DIAGNOSIS — E039 Hypothyroidism, unspecified: Secondary | ICD-10-CM | POA: Diagnosis not present

## 2023-02-28 DIAGNOSIS — G4733 Obstructive sleep apnea (adult) (pediatric): Secondary | ICD-10-CM | POA: Diagnosis not present

## 2023-02-28 DIAGNOSIS — R7303 Prediabetes: Secondary | ICD-10-CM | POA: Diagnosis not present

## 2023-02-28 DIAGNOSIS — Z6833 Body mass index (BMI) 33.0-33.9, adult: Secondary | ICD-10-CM | POA: Diagnosis not present

## 2023-03-14 DIAGNOSIS — E782 Mixed hyperlipidemia: Secondary | ICD-10-CM | POA: Diagnosis not present

## 2023-03-14 DIAGNOSIS — G4733 Obstructive sleep apnea (adult) (pediatric): Secondary | ICD-10-CM | POA: Diagnosis not present

## 2023-03-14 DIAGNOSIS — R7303 Prediabetes: Secondary | ICD-10-CM | POA: Diagnosis not present

## 2023-03-14 DIAGNOSIS — E039 Hypothyroidism, unspecified: Secondary | ICD-10-CM | POA: Diagnosis not present

## 2023-03-14 DIAGNOSIS — Z6833 Body mass index (BMI) 33.0-33.9, adult: Secondary | ICD-10-CM | POA: Diagnosis not present

## 2023-03-28 DIAGNOSIS — E669 Obesity, unspecified: Secondary | ICD-10-CM | POA: Diagnosis not present

## 2023-03-28 DIAGNOSIS — G4733 Obstructive sleep apnea (adult) (pediatric): Secondary | ICD-10-CM | POA: Diagnosis not present

## 2023-03-28 DIAGNOSIS — I671 Cerebral aneurysm, nonruptured: Secondary | ICD-10-CM | POA: Diagnosis not present

## 2023-03-28 DIAGNOSIS — Z0000 Encounter for general adult medical examination without abnormal findings: Secondary | ICD-10-CM | POA: Diagnosis not present

## 2023-03-28 DIAGNOSIS — G2581 Restless legs syndrome: Secondary | ICD-10-CM | POA: Diagnosis not present

## 2023-03-28 DIAGNOSIS — E782 Mixed hyperlipidemia: Secondary | ICD-10-CM | POA: Diagnosis not present

## 2023-03-28 DIAGNOSIS — E039 Hypothyroidism, unspecified: Secondary | ICD-10-CM | POA: Diagnosis not present

## 2023-03-28 DIAGNOSIS — I10 Essential (primary) hypertension: Secondary | ICD-10-CM | POA: Diagnosis not present

## 2023-03-28 DIAGNOSIS — Z1331 Encounter for screening for depression: Secondary | ICD-10-CM | POA: Diagnosis not present

## 2023-03-28 DIAGNOSIS — G4700 Insomnia, unspecified: Secondary | ICD-10-CM | POA: Diagnosis not present

## 2023-04-04 DIAGNOSIS — E782 Mixed hyperlipidemia: Secondary | ICD-10-CM | POA: Diagnosis not present

## 2023-04-04 DIAGNOSIS — E6609 Other obesity due to excess calories: Secondary | ICD-10-CM | POA: Diagnosis not present

## 2023-04-04 DIAGNOSIS — R7303 Prediabetes: Secondary | ICD-10-CM | POA: Diagnosis not present

## 2023-04-04 DIAGNOSIS — E039 Hypothyroidism, unspecified: Secondary | ICD-10-CM | POA: Diagnosis not present

## 2023-04-04 DIAGNOSIS — G4733 Obstructive sleep apnea (adult) (pediatric): Secondary | ICD-10-CM | POA: Diagnosis not present

## 2023-04-04 DIAGNOSIS — Z6832 Body mass index (BMI) 32.0-32.9, adult: Secondary | ICD-10-CM | POA: Diagnosis not present

## 2023-04-25 DIAGNOSIS — Z6832 Body mass index (BMI) 32.0-32.9, adult: Secondary | ICD-10-CM | POA: Diagnosis not present

## 2023-04-25 DIAGNOSIS — R7303 Prediabetes: Secondary | ICD-10-CM | POA: Diagnosis not present

## 2023-04-25 DIAGNOSIS — E039 Hypothyroidism, unspecified: Secondary | ICD-10-CM | POA: Diagnosis not present

## 2023-04-25 DIAGNOSIS — E782 Mixed hyperlipidemia: Secondary | ICD-10-CM | POA: Diagnosis not present

## 2023-04-25 DIAGNOSIS — G4733 Obstructive sleep apnea (adult) (pediatric): Secondary | ICD-10-CM | POA: Diagnosis not present

## 2023-04-25 DIAGNOSIS — E6609 Other obesity due to excess calories: Secondary | ICD-10-CM | POA: Diagnosis not present

## 2023-05-23 DIAGNOSIS — R7303 Prediabetes: Secondary | ICD-10-CM | POA: Diagnosis not present

## 2023-05-23 DIAGNOSIS — E782 Mixed hyperlipidemia: Secondary | ICD-10-CM | POA: Diagnosis not present

## 2023-05-23 DIAGNOSIS — E6609 Other obesity due to excess calories: Secondary | ICD-10-CM | POA: Diagnosis not present

## 2023-05-23 DIAGNOSIS — E039 Hypothyroidism, unspecified: Secondary | ICD-10-CM | POA: Diagnosis not present

## 2023-05-23 DIAGNOSIS — G4733 Obstructive sleep apnea (adult) (pediatric): Secondary | ICD-10-CM | POA: Diagnosis not present

## 2023-05-23 DIAGNOSIS — Z6832 Body mass index (BMI) 32.0-32.9, adult: Secondary | ICD-10-CM | POA: Diagnosis not present

## 2023-05-24 DIAGNOSIS — L2089 Other atopic dermatitis: Secondary | ICD-10-CM | POA: Diagnosis not present

## 2023-05-30 ENCOUNTER — Telehealth: Payer: Self-pay | Admitting: Student

## 2023-05-30 MED ORDER — CLOPIDOGREL BISULFATE 75 MG PO TABS: 75.0000 mg | ORAL_TABLET | Freq: Every day | ORAL | 5 refills | Status: AC

## 2023-05-30 NOTE — Telephone Encounter (Signed)
IR team contacted to refill patient's prescription for clopidogrel 75 mg once daily. Refill authorized and sent to pharmacy. Kennieth Francois, PA-C 05/30/2023

## 2023-06-12 DIAGNOSIS — Z23 Encounter for immunization: Secondary | ICD-10-CM | POA: Diagnosis not present

## 2023-06-13 DIAGNOSIS — Z6831 Body mass index (BMI) 31.0-31.9, adult: Secondary | ICD-10-CM | POA: Diagnosis not present

## 2023-06-13 DIAGNOSIS — G4733 Obstructive sleep apnea (adult) (pediatric): Secondary | ICD-10-CM | POA: Diagnosis not present

## 2023-06-13 DIAGNOSIS — E6609 Other obesity due to excess calories: Secondary | ICD-10-CM | POA: Diagnosis not present

## 2023-06-13 DIAGNOSIS — E039 Hypothyroidism, unspecified: Secondary | ICD-10-CM | POA: Diagnosis not present

## 2023-06-13 DIAGNOSIS — E782 Mixed hyperlipidemia: Secondary | ICD-10-CM | POA: Diagnosis not present

## 2023-06-13 DIAGNOSIS — R7303 Prediabetes: Secondary | ICD-10-CM | POA: Diagnosis not present

## 2023-07-04 DIAGNOSIS — E039 Hypothyroidism, unspecified: Secondary | ICD-10-CM | POA: Diagnosis not present

## 2023-07-04 DIAGNOSIS — M25559 Pain in unspecified hip: Secondary | ICD-10-CM | POA: Diagnosis not present

## 2023-07-04 DIAGNOSIS — E6609 Other obesity due to excess calories: Secondary | ICD-10-CM | POA: Diagnosis not present

## 2023-07-04 DIAGNOSIS — G4733 Obstructive sleep apnea (adult) (pediatric): Secondary | ICD-10-CM | POA: Diagnosis not present

## 2023-07-04 DIAGNOSIS — Z6831 Body mass index (BMI) 31.0-31.9, adult: Secondary | ICD-10-CM | POA: Diagnosis not present

## 2023-07-04 DIAGNOSIS — R7303 Prediabetes: Secondary | ICD-10-CM | POA: Diagnosis not present

## 2023-07-04 DIAGNOSIS — E782 Mixed hyperlipidemia: Secondary | ICD-10-CM | POA: Diagnosis not present

## 2023-07-25 DIAGNOSIS — G4733 Obstructive sleep apnea (adult) (pediatric): Secondary | ICD-10-CM | POA: Diagnosis not present

## 2023-07-25 DIAGNOSIS — R7303 Prediabetes: Secondary | ICD-10-CM | POA: Diagnosis not present

## 2023-07-25 DIAGNOSIS — E6609 Other obesity due to excess calories: Secondary | ICD-10-CM | POA: Diagnosis not present

## 2023-07-25 DIAGNOSIS — M25559 Pain in unspecified hip: Secondary | ICD-10-CM | POA: Diagnosis not present

## 2023-07-25 DIAGNOSIS — E039 Hypothyroidism, unspecified: Secondary | ICD-10-CM | POA: Diagnosis not present

## 2023-07-25 DIAGNOSIS — Z6831 Body mass index (BMI) 31.0-31.9, adult: Secondary | ICD-10-CM | POA: Diagnosis not present

## 2023-07-25 DIAGNOSIS — E782 Mixed hyperlipidemia: Secondary | ICD-10-CM | POA: Diagnosis not present

## 2023-07-25 DIAGNOSIS — K5909 Other constipation: Secondary | ICD-10-CM | POA: Diagnosis not present

## 2023-07-31 DIAGNOSIS — M5451 Vertebrogenic low back pain: Secondary | ICD-10-CM | POA: Diagnosis not present

## 2023-08-03 DIAGNOSIS — M5451 Vertebrogenic low back pain: Secondary | ICD-10-CM | POA: Diagnosis not present

## 2023-08-07 DIAGNOSIS — M5451 Vertebrogenic low back pain: Secondary | ICD-10-CM | POA: Diagnosis not present

## 2023-08-10 DIAGNOSIS — M5451 Vertebrogenic low back pain: Secondary | ICD-10-CM | POA: Diagnosis not present

## 2023-08-14 DIAGNOSIS — K5909 Other constipation: Secondary | ICD-10-CM | POA: Diagnosis not present

## 2023-08-14 DIAGNOSIS — E039 Hypothyroidism, unspecified: Secondary | ICD-10-CM | POA: Diagnosis not present

## 2023-08-14 DIAGNOSIS — E782 Mixed hyperlipidemia: Secondary | ICD-10-CM | POA: Diagnosis not present

## 2023-08-14 DIAGNOSIS — E6609 Other obesity due to excess calories: Secondary | ICD-10-CM | POA: Diagnosis not present

## 2023-08-14 DIAGNOSIS — G4733 Obstructive sleep apnea (adult) (pediatric): Secondary | ICD-10-CM | POA: Diagnosis not present

## 2023-08-14 DIAGNOSIS — M25559 Pain in unspecified hip: Secondary | ICD-10-CM | POA: Diagnosis not present

## 2023-08-14 DIAGNOSIS — Z6831 Body mass index (BMI) 31.0-31.9, adult: Secondary | ICD-10-CM | POA: Diagnosis not present

## 2023-08-14 DIAGNOSIS — R7303 Prediabetes: Secondary | ICD-10-CM | POA: Diagnosis not present

## 2023-08-16 DIAGNOSIS — M5451 Vertebrogenic low back pain: Secondary | ICD-10-CM | POA: Diagnosis not present

## 2023-08-18 DIAGNOSIS — M5451 Vertebrogenic low back pain: Secondary | ICD-10-CM | POA: Diagnosis not present

## 2023-08-28 DIAGNOSIS — Z1231 Encounter for screening mammogram for malignant neoplasm of breast: Secondary | ICD-10-CM | POA: Diagnosis not present

## 2023-08-28 DIAGNOSIS — E2839 Other primary ovarian failure: Secondary | ICD-10-CM | POA: Diagnosis not present

## 2023-08-30 DIAGNOSIS — M5451 Vertebrogenic low back pain: Secondary | ICD-10-CM | POA: Diagnosis not present

## 2023-08-31 DIAGNOSIS — L2089 Other atopic dermatitis: Secondary | ICD-10-CM | POA: Diagnosis not present

## 2023-09-01 DIAGNOSIS — M5451 Vertebrogenic low back pain: Secondary | ICD-10-CM | POA: Diagnosis not present

## 2023-09-04 DIAGNOSIS — M5451 Vertebrogenic low back pain: Secondary | ICD-10-CM | POA: Diagnosis not present

## 2023-09-06 DIAGNOSIS — M5451 Vertebrogenic low back pain: Secondary | ICD-10-CM | POA: Diagnosis not present

## 2023-09-13 DIAGNOSIS — M5451 Vertebrogenic low back pain: Secondary | ICD-10-CM | POA: Diagnosis not present

## 2023-09-14 DIAGNOSIS — I83811 Varicose veins of right lower extremities with pain: Secondary | ICD-10-CM | POA: Diagnosis not present

## 2023-09-14 DIAGNOSIS — M7989 Other specified soft tissue disorders: Secondary | ICD-10-CM | POA: Diagnosis not present

## 2023-09-14 DIAGNOSIS — I872 Venous insufficiency (chronic) (peripheral): Secondary | ICD-10-CM | POA: Diagnosis not present

## 2023-09-18 DIAGNOSIS — Z6831 Body mass index (BMI) 31.0-31.9, adult: Secondary | ICD-10-CM | POA: Diagnosis not present

## 2023-09-18 DIAGNOSIS — K5909 Other constipation: Secondary | ICD-10-CM | POA: Diagnosis not present

## 2023-09-18 DIAGNOSIS — E6609 Other obesity due to excess calories: Secondary | ICD-10-CM | POA: Diagnosis not present

## 2023-09-18 DIAGNOSIS — E66811 Obesity, class 1: Secondary | ICD-10-CM | POA: Diagnosis not present

## 2023-09-18 DIAGNOSIS — R7303 Prediabetes: Secondary | ICD-10-CM | POA: Diagnosis not present

## 2023-09-25 DIAGNOSIS — M5451 Vertebrogenic low back pain: Secondary | ICD-10-CM | POA: Diagnosis not present

## 2023-10-02 DIAGNOSIS — M5451 Vertebrogenic low back pain: Secondary | ICD-10-CM | POA: Diagnosis not present

## 2023-10-03 DIAGNOSIS — E039 Hypothyroidism, unspecified: Secondary | ICD-10-CM | POA: Diagnosis not present

## 2023-10-03 DIAGNOSIS — I10 Essential (primary) hypertension: Secondary | ICD-10-CM | POA: Diagnosis not present

## 2023-10-03 DIAGNOSIS — G4700 Insomnia, unspecified: Secondary | ICD-10-CM | POA: Diagnosis not present

## 2023-10-03 DIAGNOSIS — G4733 Obstructive sleep apnea (adult) (pediatric): Secondary | ICD-10-CM | POA: Diagnosis not present

## 2023-10-03 DIAGNOSIS — R7303 Prediabetes: Secondary | ICD-10-CM | POA: Diagnosis not present

## 2023-10-03 DIAGNOSIS — R202 Paresthesia of skin: Secondary | ICD-10-CM | POA: Diagnosis not present

## 2023-10-03 DIAGNOSIS — K5909 Other constipation: Secondary | ICD-10-CM | POA: Diagnosis not present

## 2023-10-03 DIAGNOSIS — E782 Mixed hyperlipidemia: Secondary | ICD-10-CM | POA: Diagnosis not present

## 2023-10-03 DIAGNOSIS — E669 Obesity, unspecified: Secondary | ICD-10-CM | POA: Diagnosis not present

## 2023-10-09 DIAGNOSIS — M5451 Vertebrogenic low back pain: Secondary | ICD-10-CM | POA: Diagnosis not present

## 2023-10-17 DIAGNOSIS — E6609 Other obesity due to excess calories: Secondary | ICD-10-CM | POA: Diagnosis not present

## 2023-10-17 DIAGNOSIS — Z6831 Body mass index (BMI) 31.0-31.9, adult: Secondary | ICD-10-CM | POA: Diagnosis not present

## 2023-10-17 DIAGNOSIS — E66811 Obesity, class 1: Secondary | ICD-10-CM | POA: Diagnosis not present

## 2023-10-17 DIAGNOSIS — R7303 Prediabetes: Secondary | ICD-10-CM | POA: Diagnosis not present

## 2023-10-17 DIAGNOSIS — E039 Hypothyroidism, unspecified: Secondary | ICD-10-CM | POA: Diagnosis not present

## 2023-10-17 DIAGNOSIS — K5909 Other constipation: Secondary | ICD-10-CM | POA: Diagnosis not present

## 2023-10-17 DIAGNOSIS — M25559 Pain in unspecified hip: Secondary | ICD-10-CM | POA: Diagnosis not present

## 2023-10-17 DIAGNOSIS — G4733 Obstructive sleep apnea (adult) (pediatric): Secondary | ICD-10-CM | POA: Diagnosis not present

## 2023-10-17 DIAGNOSIS — E782 Mixed hyperlipidemia: Secondary | ICD-10-CM | POA: Diagnosis not present

## 2023-11-07 ENCOUNTER — Other Ambulatory Visit: Payer: Self-pay | Admitting: Family Medicine

## 2023-11-07 NOTE — Telephone Encounter (Signed)
Pt has not been seen for RLS since 2021. Rx refill not appropriate.

## 2023-11-14 DIAGNOSIS — E66811 Obesity, class 1: Secondary | ICD-10-CM | POA: Diagnosis not present

## 2023-11-14 DIAGNOSIS — K5909 Other constipation: Secondary | ICD-10-CM | POA: Diagnosis not present

## 2023-11-14 DIAGNOSIS — Z6831 Body mass index (BMI) 31.0-31.9, adult: Secondary | ICD-10-CM | POA: Diagnosis not present

## 2023-11-14 DIAGNOSIS — R7303 Prediabetes: Secondary | ICD-10-CM | POA: Diagnosis not present

## 2023-11-14 DIAGNOSIS — E6609 Other obesity due to excess calories: Secondary | ICD-10-CM | POA: Diagnosis not present

## 2023-11-14 DIAGNOSIS — E039 Hypothyroidism, unspecified: Secondary | ICD-10-CM | POA: Diagnosis not present

## 2023-11-14 DIAGNOSIS — G4733 Obstructive sleep apnea (adult) (pediatric): Secondary | ICD-10-CM | POA: Diagnosis not present

## 2023-11-14 DIAGNOSIS — M25559 Pain in unspecified hip: Secondary | ICD-10-CM | POA: Diagnosis not present

## 2023-11-14 DIAGNOSIS — E782 Mixed hyperlipidemia: Secondary | ICD-10-CM | POA: Diagnosis not present

## 2023-12-12 DIAGNOSIS — E782 Mixed hyperlipidemia: Secondary | ICD-10-CM | POA: Diagnosis not present

## 2023-12-12 DIAGNOSIS — Z6831 Body mass index (BMI) 31.0-31.9, adult: Secondary | ICD-10-CM | POA: Diagnosis not present

## 2023-12-12 DIAGNOSIS — E66811 Obesity, class 1: Secondary | ICD-10-CM | POA: Diagnosis not present

## 2023-12-12 DIAGNOSIS — K5909 Other constipation: Secondary | ICD-10-CM | POA: Diagnosis not present

## 2023-12-12 DIAGNOSIS — E6609 Other obesity due to excess calories: Secondary | ICD-10-CM | POA: Diagnosis not present

## 2023-12-12 DIAGNOSIS — M25559 Pain in unspecified hip: Secondary | ICD-10-CM | POA: Diagnosis not present

## 2023-12-12 DIAGNOSIS — G4733 Obstructive sleep apnea (adult) (pediatric): Secondary | ICD-10-CM | POA: Diagnosis not present

## 2023-12-12 DIAGNOSIS — E039 Hypothyroidism, unspecified: Secondary | ICD-10-CM | POA: Diagnosis not present

## 2023-12-12 DIAGNOSIS — R7303 Prediabetes: Secondary | ICD-10-CM | POA: Diagnosis not present

## 2023-12-18 DIAGNOSIS — R739 Hyperglycemia, unspecified: Secondary | ICD-10-CM | POA: Diagnosis not present

## 2023-12-18 DIAGNOSIS — H25813 Combined forms of age-related cataract, bilateral: Secondary | ICD-10-CM | POA: Diagnosis not present

## 2023-12-28 DIAGNOSIS — G4733 Obstructive sleep apnea (adult) (pediatric): Secondary | ICD-10-CM | POA: Diagnosis not present

## 2024-01-15 ENCOUNTER — Other Ambulatory Visit: Payer: Self-pay | Admitting: Physician Assistant

## 2024-01-15 MED ORDER — CLOPIDOGREL BISULFATE 75 MG PO TABS: 75.0000 mg | ORAL_TABLET | Freq: Every day | ORAL | 3 refills | Status: AC

## 2024-01-23 DIAGNOSIS — E6609 Other obesity due to excess calories: Secondary | ICD-10-CM | POA: Diagnosis not present

## 2024-01-23 DIAGNOSIS — M25559 Pain in unspecified hip: Secondary | ICD-10-CM | POA: Diagnosis not present

## 2024-01-23 DIAGNOSIS — E782 Mixed hyperlipidemia: Secondary | ICD-10-CM | POA: Diagnosis not present

## 2024-01-23 DIAGNOSIS — E039 Hypothyroidism, unspecified: Secondary | ICD-10-CM | POA: Diagnosis not present

## 2024-01-23 DIAGNOSIS — G4733 Obstructive sleep apnea (adult) (pediatric): Secondary | ICD-10-CM | POA: Diagnosis not present

## 2024-01-23 DIAGNOSIS — E66811 Obesity, class 1: Secondary | ICD-10-CM | POA: Diagnosis not present

## 2024-01-23 DIAGNOSIS — Z6831 Body mass index (BMI) 31.0-31.9, adult: Secondary | ICD-10-CM | POA: Diagnosis not present

## 2024-01-23 DIAGNOSIS — R7303 Prediabetes: Secondary | ICD-10-CM | POA: Diagnosis not present

## 2024-01-23 DIAGNOSIS — K5909 Other constipation: Secondary | ICD-10-CM | POA: Diagnosis not present

## 2024-02-07 ENCOUNTER — Other Ambulatory Visit: Payer: Self-pay | Admitting: Family Medicine

## 2024-02-07 NOTE — Telephone Encounter (Signed)
 Last OV 02/11/20 Next OV not scheduled  Last refill 09/05/22 Qty #180/3

## 2024-02-27 DIAGNOSIS — M79604 Pain in right leg: Secondary | ICD-10-CM | POA: Diagnosis not present

## 2024-02-27 DIAGNOSIS — G2581 Restless legs syndrome: Secondary | ICD-10-CM | POA: Diagnosis not present

## 2024-03-05 DIAGNOSIS — E6609 Other obesity due to excess calories: Secondary | ICD-10-CM | POA: Diagnosis not present

## 2024-03-05 DIAGNOSIS — G4733 Obstructive sleep apnea (adult) (pediatric): Secondary | ICD-10-CM | POA: Diagnosis not present

## 2024-03-05 DIAGNOSIS — E782 Mixed hyperlipidemia: Secondary | ICD-10-CM | POA: Diagnosis not present

## 2024-03-05 DIAGNOSIS — E039 Hypothyroidism, unspecified: Secondary | ICD-10-CM | POA: Diagnosis not present

## 2024-03-05 DIAGNOSIS — M25559 Pain in unspecified hip: Secondary | ICD-10-CM | POA: Diagnosis not present

## 2024-03-05 DIAGNOSIS — E66811 Obesity, class 1: Secondary | ICD-10-CM | POA: Diagnosis not present

## 2024-03-05 DIAGNOSIS — K5909 Other constipation: Secondary | ICD-10-CM | POA: Diagnosis not present

## 2024-03-05 DIAGNOSIS — R7303 Prediabetes: Secondary | ICD-10-CM | POA: Diagnosis not present

## 2024-03-05 DIAGNOSIS — Z6831 Body mass index (BMI) 31.0-31.9, adult: Secondary | ICD-10-CM | POA: Diagnosis not present

## 2024-03-23 ENCOUNTER — Encounter (HOSPITAL_COMMUNITY): Payer: Self-pay | Admitting: Interventional Radiology

## 2024-04-08 ENCOUNTER — Other Ambulatory Visit: Payer: Self-pay | Admitting: Family Medicine

## 2024-04-09 NOTE — Telephone Encounter (Signed)
 Last OV 02/11/20  Pt needs OV

## 2024-04-11 DIAGNOSIS — G4733 Obstructive sleep apnea (adult) (pediatric): Secondary | ICD-10-CM | POA: Diagnosis not present

## 2024-04-17 DIAGNOSIS — G4700 Insomnia, unspecified: Secondary | ICD-10-CM | POA: Diagnosis not present

## 2024-04-17 DIAGNOSIS — Z0000 Encounter for general adult medical examination without abnormal findings: Secondary | ICD-10-CM | POA: Diagnosis not present

## 2024-04-17 DIAGNOSIS — I10 Essential (primary) hypertension: Secondary | ICD-10-CM | POA: Diagnosis not present

## 2024-04-17 DIAGNOSIS — E669 Obesity, unspecified: Secondary | ICD-10-CM | POA: Diagnosis not present

## 2024-04-17 DIAGNOSIS — I671 Cerebral aneurysm, nonruptured: Secondary | ICD-10-CM | POA: Diagnosis not present

## 2024-04-17 DIAGNOSIS — E039 Hypothyroidism, unspecified: Secondary | ICD-10-CM | POA: Diagnosis not present

## 2024-04-17 DIAGNOSIS — Z1331 Encounter for screening for depression: Secondary | ICD-10-CM | POA: Diagnosis not present

## 2024-04-17 DIAGNOSIS — E782 Mixed hyperlipidemia: Secondary | ICD-10-CM | POA: Diagnosis not present

## 2024-04-17 DIAGNOSIS — G2581 Restless legs syndrome: Secondary | ICD-10-CM | POA: Diagnosis not present

## 2024-04-17 DIAGNOSIS — R7303 Prediabetes: Secondary | ICD-10-CM | POA: Diagnosis not present

## 2024-04-17 DIAGNOSIS — G4733 Obstructive sleep apnea (adult) (pediatric): Secondary | ICD-10-CM | POA: Diagnosis not present

## 2024-04-17 DIAGNOSIS — M79604 Pain in right leg: Secondary | ICD-10-CM | POA: Diagnosis not present

## 2024-04-23 DIAGNOSIS — K5909 Other constipation: Secondary | ICD-10-CM | POA: Diagnosis not present

## 2024-04-23 DIAGNOSIS — E66811 Obesity, class 1: Secondary | ICD-10-CM | POA: Diagnosis not present

## 2024-04-23 DIAGNOSIS — R7303 Prediabetes: Secondary | ICD-10-CM | POA: Diagnosis not present

## 2024-04-23 DIAGNOSIS — Z6831 Body mass index (BMI) 31.0-31.9, adult: Secondary | ICD-10-CM | POA: Diagnosis not present

## 2024-04-23 DIAGNOSIS — E782 Mixed hyperlipidemia: Secondary | ICD-10-CM | POA: Diagnosis not present

## 2024-04-23 DIAGNOSIS — M25559 Pain in unspecified hip: Secondary | ICD-10-CM | POA: Diagnosis not present

## 2024-04-23 DIAGNOSIS — E039 Hypothyroidism, unspecified: Secondary | ICD-10-CM | POA: Diagnosis not present

## 2024-04-23 DIAGNOSIS — E6609 Other obesity due to excess calories: Secondary | ICD-10-CM | POA: Diagnosis not present

## 2024-04-23 DIAGNOSIS — G4733 Obstructive sleep apnea (adult) (pediatric): Secondary | ICD-10-CM | POA: Diagnosis not present

## 2024-05-23 DIAGNOSIS — E66811 Obesity, class 1: Secondary | ICD-10-CM | POA: Diagnosis not present

## 2024-05-23 DIAGNOSIS — E039 Hypothyroidism, unspecified: Secondary | ICD-10-CM | POA: Diagnosis not present

## 2024-05-23 DIAGNOSIS — E782 Mixed hyperlipidemia: Secondary | ICD-10-CM | POA: Diagnosis not present

## 2024-05-23 DIAGNOSIS — Z6831 Body mass index (BMI) 31.0-31.9, adult: Secondary | ICD-10-CM | POA: Diagnosis not present

## 2024-05-23 DIAGNOSIS — G4733 Obstructive sleep apnea (adult) (pediatric): Secondary | ICD-10-CM | POA: Diagnosis not present

## 2024-05-23 DIAGNOSIS — M25559 Pain in unspecified hip: Secondary | ICD-10-CM | POA: Diagnosis not present

## 2024-05-23 DIAGNOSIS — E6609 Other obesity due to excess calories: Secondary | ICD-10-CM | POA: Diagnosis not present

## 2024-05-23 DIAGNOSIS — R7303 Prediabetes: Secondary | ICD-10-CM | POA: Diagnosis not present

## 2024-05-23 DIAGNOSIS — K5909 Other constipation: Secondary | ICD-10-CM | POA: Diagnosis not present

## 2024-06-13 DIAGNOSIS — R7303 Prediabetes: Secondary | ICD-10-CM | POA: Diagnosis not present

## 2024-06-13 DIAGNOSIS — E039 Hypothyroidism, unspecified: Secondary | ICD-10-CM | POA: Diagnosis not present

## 2024-06-13 DIAGNOSIS — E66811 Obesity, class 1: Secondary | ICD-10-CM | POA: Diagnosis not present

## 2024-06-13 DIAGNOSIS — E6609 Other obesity due to excess calories: Secondary | ICD-10-CM | POA: Diagnosis not present

## 2024-06-13 DIAGNOSIS — K5909 Other constipation: Secondary | ICD-10-CM | POA: Diagnosis not present

## 2024-06-13 DIAGNOSIS — G4733 Obstructive sleep apnea (adult) (pediatric): Secondary | ICD-10-CM | POA: Diagnosis not present

## 2024-06-13 DIAGNOSIS — I10 Essential (primary) hypertension: Secondary | ICD-10-CM | POA: Diagnosis not present

## 2024-06-13 DIAGNOSIS — E782 Mixed hyperlipidemia: Secondary | ICD-10-CM | POA: Diagnosis not present

## 2024-06-13 DIAGNOSIS — Z6830 Body mass index (BMI) 30.0-30.9, adult: Secondary | ICD-10-CM | POA: Diagnosis not present

## 2024-06-27 DIAGNOSIS — Z23 Encounter for immunization: Secondary | ICD-10-CM | POA: Diagnosis not present

## 2024-07-11 DIAGNOSIS — E6609 Other obesity due to excess calories: Secondary | ICD-10-CM | POA: Diagnosis not present

## 2024-07-11 DIAGNOSIS — E782 Mixed hyperlipidemia: Secondary | ICD-10-CM | POA: Diagnosis not present

## 2024-07-11 DIAGNOSIS — E66811 Obesity, class 1: Secondary | ICD-10-CM | POA: Diagnosis not present

## 2024-07-11 DIAGNOSIS — R7303 Prediabetes: Secondary | ICD-10-CM | POA: Diagnosis not present

## 2024-07-11 DIAGNOSIS — I10 Essential (primary) hypertension: Secondary | ICD-10-CM | POA: Diagnosis not present

## 2024-07-11 DIAGNOSIS — E039 Hypothyroidism, unspecified: Secondary | ICD-10-CM | POA: Diagnosis not present

## 2024-07-11 DIAGNOSIS — G4733 Obstructive sleep apnea (adult) (pediatric): Secondary | ICD-10-CM | POA: Diagnosis not present

## 2024-07-11 DIAGNOSIS — Z6829 Body mass index (BMI) 29.0-29.9, adult: Secondary | ICD-10-CM | POA: Diagnosis not present

## 2024-07-11 DIAGNOSIS — K5909 Other constipation: Secondary | ICD-10-CM | POA: Diagnosis not present

## 2024-08-27 DIAGNOSIS — E66811 Obesity, class 1: Secondary | ICD-10-CM | POA: Diagnosis not present

## 2024-08-27 DIAGNOSIS — E039 Hypothyroidism, unspecified: Secondary | ICD-10-CM | POA: Diagnosis not present

## 2024-08-27 DIAGNOSIS — G4733 Obstructive sleep apnea (adult) (pediatric): Secondary | ICD-10-CM | POA: Diagnosis not present

## 2024-08-27 DIAGNOSIS — E6609 Other obesity due to excess calories: Secondary | ICD-10-CM | POA: Diagnosis not present

## 2024-08-27 DIAGNOSIS — E782 Mixed hyperlipidemia: Secondary | ICD-10-CM | POA: Diagnosis not present

## 2024-08-27 DIAGNOSIS — Z6829 Body mass index (BMI) 29.0-29.9, adult: Secondary | ICD-10-CM | POA: Diagnosis not present

## 2024-08-27 DIAGNOSIS — R7303 Prediabetes: Secondary | ICD-10-CM | POA: Diagnosis not present

## 2024-08-27 DIAGNOSIS — I10 Essential (primary) hypertension: Secondary | ICD-10-CM | POA: Diagnosis not present

## 2024-08-27 DIAGNOSIS — K5909 Other constipation: Secondary | ICD-10-CM | POA: Diagnosis not present

## 2024-09-04 DIAGNOSIS — J101 Influenza due to other identified influenza virus with other respiratory manifestations: Secondary | ICD-10-CM | POA: Diagnosis not present

## 2024-09-04 DIAGNOSIS — J029 Acute pharyngitis, unspecified: Secondary | ICD-10-CM | POA: Diagnosis not present

## 2024-09-04 DIAGNOSIS — R509 Fever, unspecified: Secondary | ICD-10-CM | POA: Diagnosis not present

## 2024-09-04 DIAGNOSIS — R051 Acute cough: Secondary | ICD-10-CM | POA: Diagnosis not present

## 2024-09-06 DIAGNOSIS — Z1231 Encounter for screening mammogram for malignant neoplasm of breast: Secondary | ICD-10-CM | POA: Diagnosis not present

## 2024-09-10 DIAGNOSIS — I872 Venous insufficiency (chronic) (peripheral): Secondary | ICD-10-CM | POA: Diagnosis not present

## 2024-09-10 DIAGNOSIS — I87391 Chronic venous hypertension (idiopathic) with other complications of right lower extremity: Secondary | ICD-10-CM | POA: Diagnosis not present

## 2024-10-09 ENCOUNTER — Encounter (HOSPITAL_COMMUNITY): Payer: Self-pay

## 2024-10-09 ENCOUNTER — Other Ambulatory Visit (HOSPITAL_COMMUNITY): Payer: Self-pay | Admitting: Radiology

## 2024-10-09 DIAGNOSIS — I671 Cerebral aneurysm, nonruptured: Secondary | ICD-10-CM

## 2024-10-09 DIAGNOSIS — I6529 Occlusion and stenosis of unspecified carotid artery: Secondary | ICD-10-CM

## 2024-10-23 ENCOUNTER — Ambulatory Visit: Admitting: Neuroradiology

## 2024-11-06 ENCOUNTER — Ambulatory Visit: Admitting: Neuroradiology
